# Patient Record
Sex: Male | Born: 1935 | Race: White | Hispanic: No | Marital: Married | Smoking: Former smoker
Health system: Southern US, Community
[De-identification: ages and names within clinical notes are randomized; demographics above are authoritative.]

## PROBLEM LIST (undated history)

## (undated) DIAGNOSIS — F329 Major depressive disorder, single episode, unspecified: Secondary | ICD-10-CM

## (undated) DIAGNOSIS — J449 Chronic obstructive pulmonary disease, unspecified: Secondary | ICD-10-CM

## (undated) DIAGNOSIS — M1990 Unspecified osteoarthritis, unspecified site: Secondary | ICD-10-CM

## (undated) DIAGNOSIS — N200 Calculus of kidney: Secondary | ICD-10-CM

## (undated) DIAGNOSIS — G4730 Sleep apnea, unspecified: Secondary | ICD-10-CM

## (undated) DIAGNOSIS — C801 Malignant (primary) neoplasm, unspecified: Secondary | ICD-10-CM

## (undated) DIAGNOSIS — F32A Depression, unspecified: Secondary | ICD-10-CM

## (undated) DIAGNOSIS — I639 Cerebral infarction, unspecified: Secondary | ICD-10-CM

## (undated) DIAGNOSIS — J15212 Pneumonia due to Methicillin resistant Staphylococcus aureus: Secondary | ICD-10-CM

## (undated) DIAGNOSIS — N400 Enlarged prostate without lower urinary tract symptoms: Secondary | ICD-10-CM

## (undated) DIAGNOSIS — K2270 Barrett's esophagus without dysplasia: Secondary | ICD-10-CM

## (undated) DIAGNOSIS — D649 Anemia, unspecified: Secondary | ICD-10-CM

## (undated) DIAGNOSIS — I2510 Atherosclerotic heart disease of native coronary artery without angina pectoris: Secondary | ICD-10-CM

## (undated) HISTORY — PX: LUNG REMOVAL, PARTIAL: SHX233

## (undated) HISTORY — PX: LAMINECTOMY: SHX219

## (undated) HISTORY — PX: GASTROSTOMY W/ FEEDING TUBE: SUR642

## (undated) HISTORY — PX: PEG TUBE PLACEMENT: SUR1034

## (undated) HISTORY — PX: OTHER SURGICAL HISTORY: SHX169

## (undated) HISTORY — PX: TRACHEOSTOMY: SUR1362

## (undated) HISTORY — PX: ROTATOR CUFF REPAIR: SHX139

## (undated) HISTORY — PX: URETERAL STENT PLACEMENT: SHX822

---

## 2004-05-10 ENCOUNTER — Emergency Department: Payer: Self-pay | Admitting: Emergency Medicine

## 2004-08-25 ENCOUNTER — Emergency Department: Payer: Self-pay | Admitting: Emergency Medicine

## 2004-11-15 ENCOUNTER — Ambulatory Visit: Payer: Self-pay | Admitting: Internal Medicine

## 2005-03-08 ENCOUNTER — Ambulatory Visit: Payer: Self-pay | Admitting: Unknown Physician Specialty

## 2005-08-11 ENCOUNTER — Ambulatory Visit: Payer: Self-pay | Admitting: Internal Medicine

## 2007-09-22 ENCOUNTER — Emergency Department: Payer: Self-pay | Admitting: Emergency Medicine

## 2007-10-03 ENCOUNTER — Ambulatory Visit: Payer: Self-pay | Admitting: Specialist

## 2008-06-02 ENCOUNTER — Ambulatory Visit: Payer: Self-pay | Admitting: Internal Medicine

## 2008-06-05 ENCOUNTER — Ambulatory Visit: Payer: Self-pay | Admitting: Internal Medicine

## 2008-09-16 ENCOUNTER — Emergency Department: Payer: Self-pay | Admitting: Surgery

## 2008-12-14 ENCOUNTER — Inpatient Hospital Stay: Payer: Self-pay | Admitting: Internal Medicine

## 2008-12-21 ENCOUNTER — Inpatient Hospital Stay: Payer: Self-pay | Admitting: Specialist

## 2008-12-24 ENCOUNTER — Ambulatory Visit: Payer: Self-pay | Admitting: Urology

## 2009-01-02 ENCOUNTER — Ambulatory Visit: Payer: Self-pay | Admitting: Urology

## 2009-01-08 ENCOUNTER — Ambulatory Visit: Payer: Self-pay | Admitting: Urology

## 2009-01-19 ENCOUNTER — Ambulatory Visit: Payer: Self-pay | Admitting: Urology

## 2009-02-13 ENCOUNTER — Ambulatory Visit: Payer: Self-pay | Admitting: Internal Medicine

## 2009-02-17 ENCOUNTER — Ambulatory Visit: Payer: Self-pay | Admitting: Urology

## 2009-03-05 ENCOUNTER — Ambulatory Visit: Payer: Self-pay | Admitting: Urology

## 2009-03-20 ENCOUNTER — Ambulatory Visit: Payer: Self-pay | Admitting: Urology

## 2009-04-27 ENCOUNTER — Ambulatory Visit: Payer: Self-pay | Admitting: Urology

## 2009-11-18 IMAGING — CR DG ABDOMEN 1V
1 series · 1 of 1 positions shown · non-contrast
Comparison: none

REASON FOR EXAM: renal calculi-lithotripsy
COMMENTS:

[view not recorded]
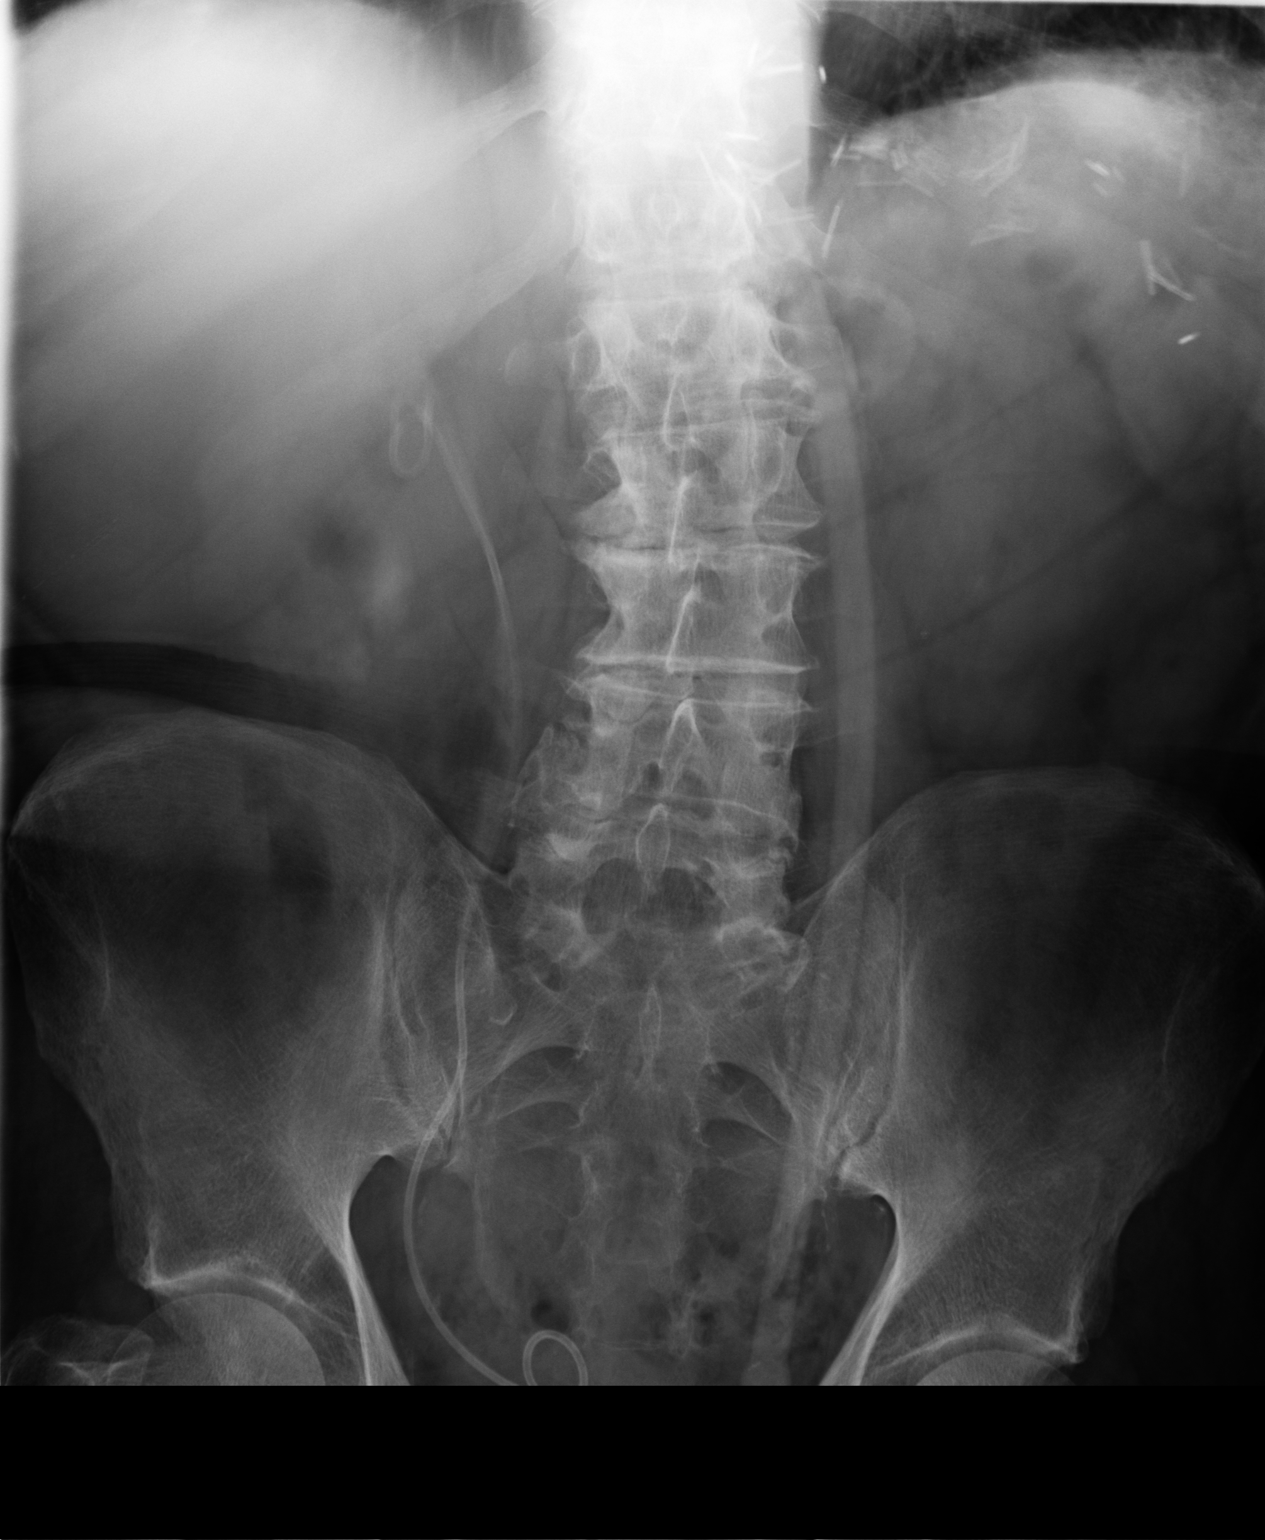

[1 of 1 positions shown; findings below may reference images not displayed]

PROCEDURE:     DXR - DXR KIDNEY URETER BLADDER  - January 08, 2009  [DATE]

RESULT:     A double-J, right ureteral stent is present. There is
significant motion artifact. A percutaneous gastrostomy tube is present.
Poorly demonstrated density over the lower pole of the right kidney region
is likely a large renal calculus but again there is significant motion
artifact. There is no previous exam for comparison.
IMPRESSION: Double-J, right ureteral stent with probable right renal
calculus.

## 2009-11-22 ENCOUNTER — Emergency Department: Payer: Self-pay | Admitting: Emergency Medicine

## 2010-05-10 ENCOUNTER — Emergency Department: Payer: Self-pay | Admitting: Emergency Medicine

## 2010-07-05 ENCOUNTER — Ambulatory Visit: Payer: Self-pay | Admitting: Internal Medicine

## 2011-01-11 ENCOUNTER — Emergency Department: Payer: Self-pay | Admitting: Emergency Medicine

## 2011-04-05 ENCOUNTER — Ambulatory Visit: Payer: Self-pay | Admitting: Otolaryngology

## 2011-06-06 ENCOUNTER — Ambulatory Visit: Payer: Self-pay | Admitting: Urology

## 2011-09-05 ENCOUNTER — Emergency Department: Payer: Self-pay | Admitting: Emergency Medicine

## 2011-10-31 ENCOUNTER — Ambulatory Visit: Payer: Self-pay

## 2012-01-23 ENCOUNTER — Emergency Department: Payer: Self-pay | Admitting: Emergency Medicine

## 2012-05-28 ENCOUNTER — Ambulatory Visit: Payer: Self-pay | Admitting: Urology

## 2012-07-05 ENCOUNTER — Ambulatory Visit: Payer: Self-pay

## 2012-11-26 ENCOUNTER — Ambulatory Visit: Payer: Self-pay | Admitting: Physician Assistant

## 2012-12-04 ENCOUNTER — Ambulatory Visit: Payer: Self-pay | Admitting: Urology

## 2012-12-10 ENCOUNTER — Ambulatory Visit: Payer: Self-pay | Admitting: Urology

## 2012-12-25 ENCOUNTER — Emergency Department: Payer: Self-pay | Admitting: Emergency Medicine

## 2013-01-07 ENCOUNTER — Ambulatory Visit: Payer: Self-pay | Admitting: Internal Medicine

## 2013-01-11 ENCOUNTER — Ambulatory Visit: Payer: Self-pay | Admitting: Internal Medicine

## 2013-01-21 ENCOUNTER — Ambulatory Visit: Payer: Self-pay | Admitting: Internal Medicine

## 2013-02-21 ENCOUNTER — Emergency Department: Payer: Self-pay | Admitting: Emergency Medicine

## 2013-02-22 ENCOUNTER — Ambulatory Visit: Payer: Self-pay | Admitting: Internal Medicine

## 2013-06-06 ENCOUNTER — Ambulatory Visit: Payer: Self-pay | Admitting: Urology

## 2013-11-17 IMAGING — CR DG CHEST 2V
1 series · 3 of 3 positions shown · non-contrast
Comparison: none

REASON FOR EXAM: wheezing
COMMENTS:

PROCEDURE:     KDR - KDXR CHEST PA (OR AP) AND LAT  - January 07, 2013 [DATE]
RESULT:     Comparison: 11/26/2012, 07/05/2010

[Series 1: pa · 0.17mm/px · 3 of 3 slices shown]
[im 1/3]
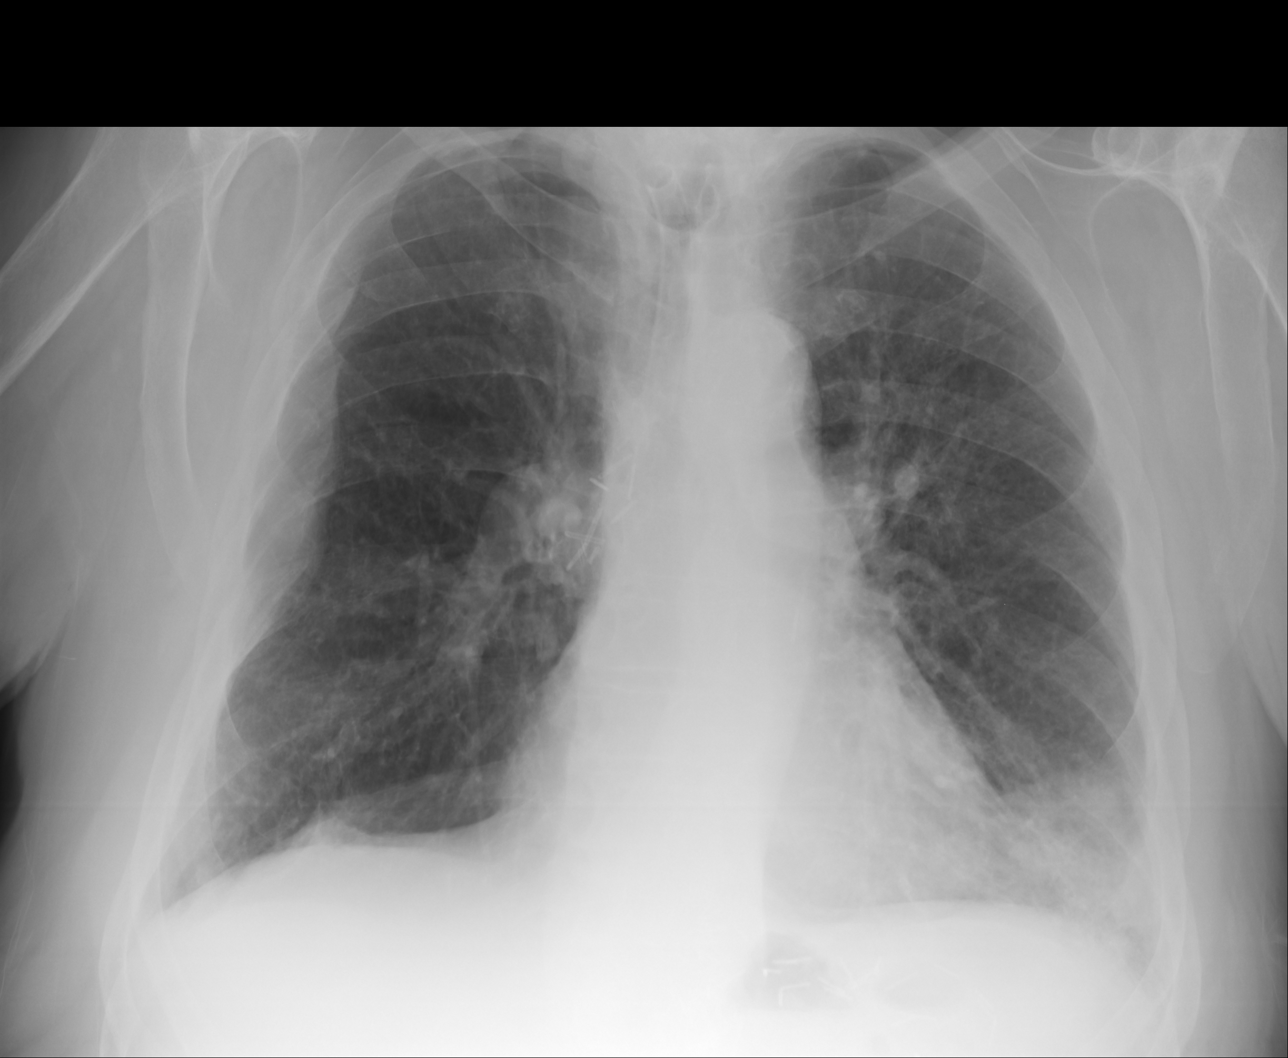
[im 2/3]
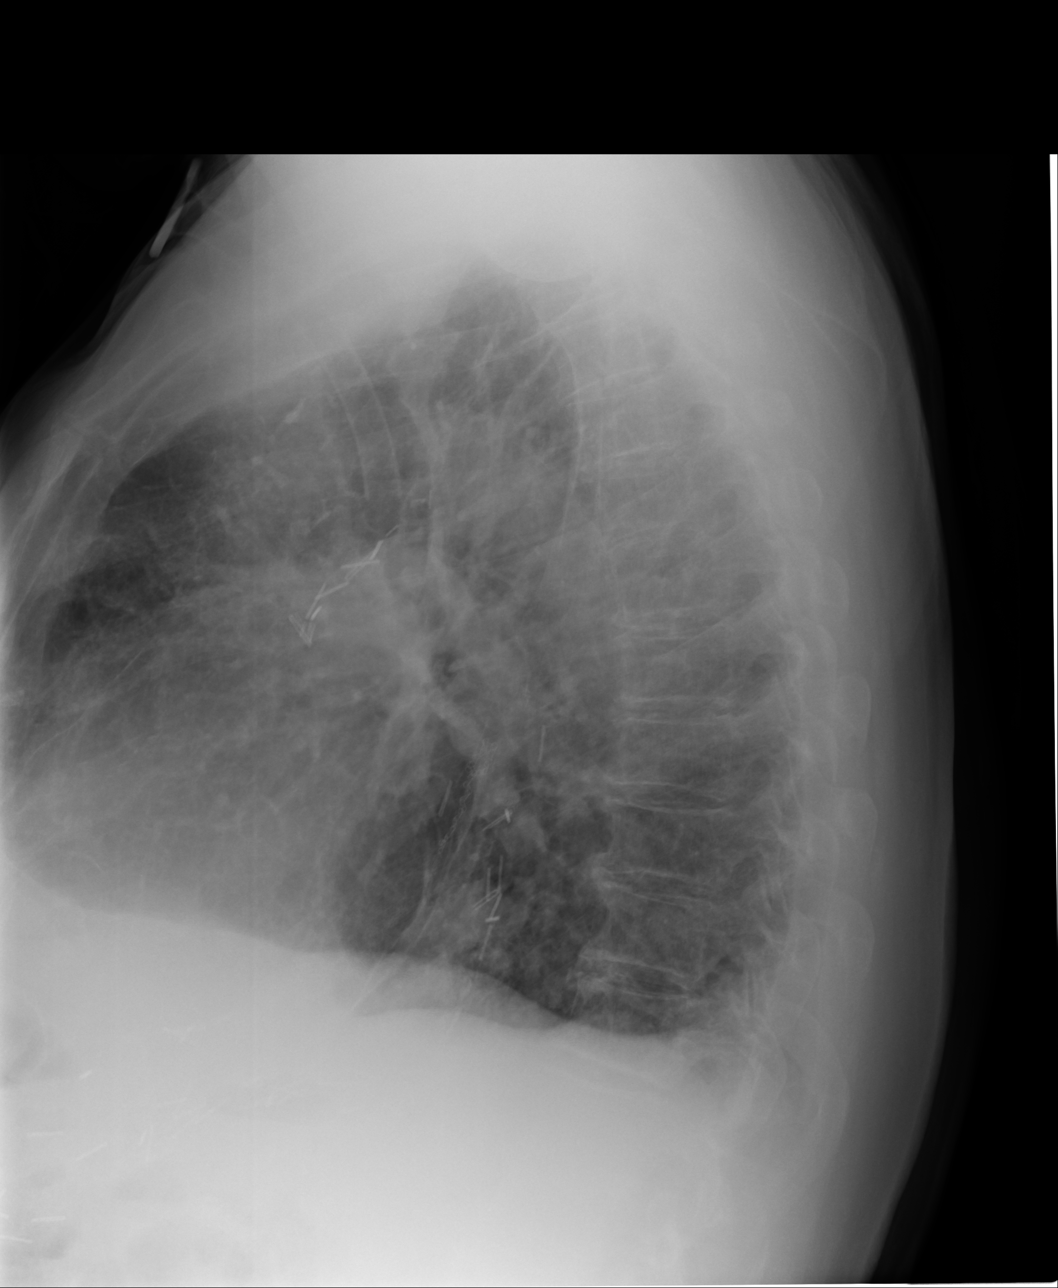
[im 3/3]
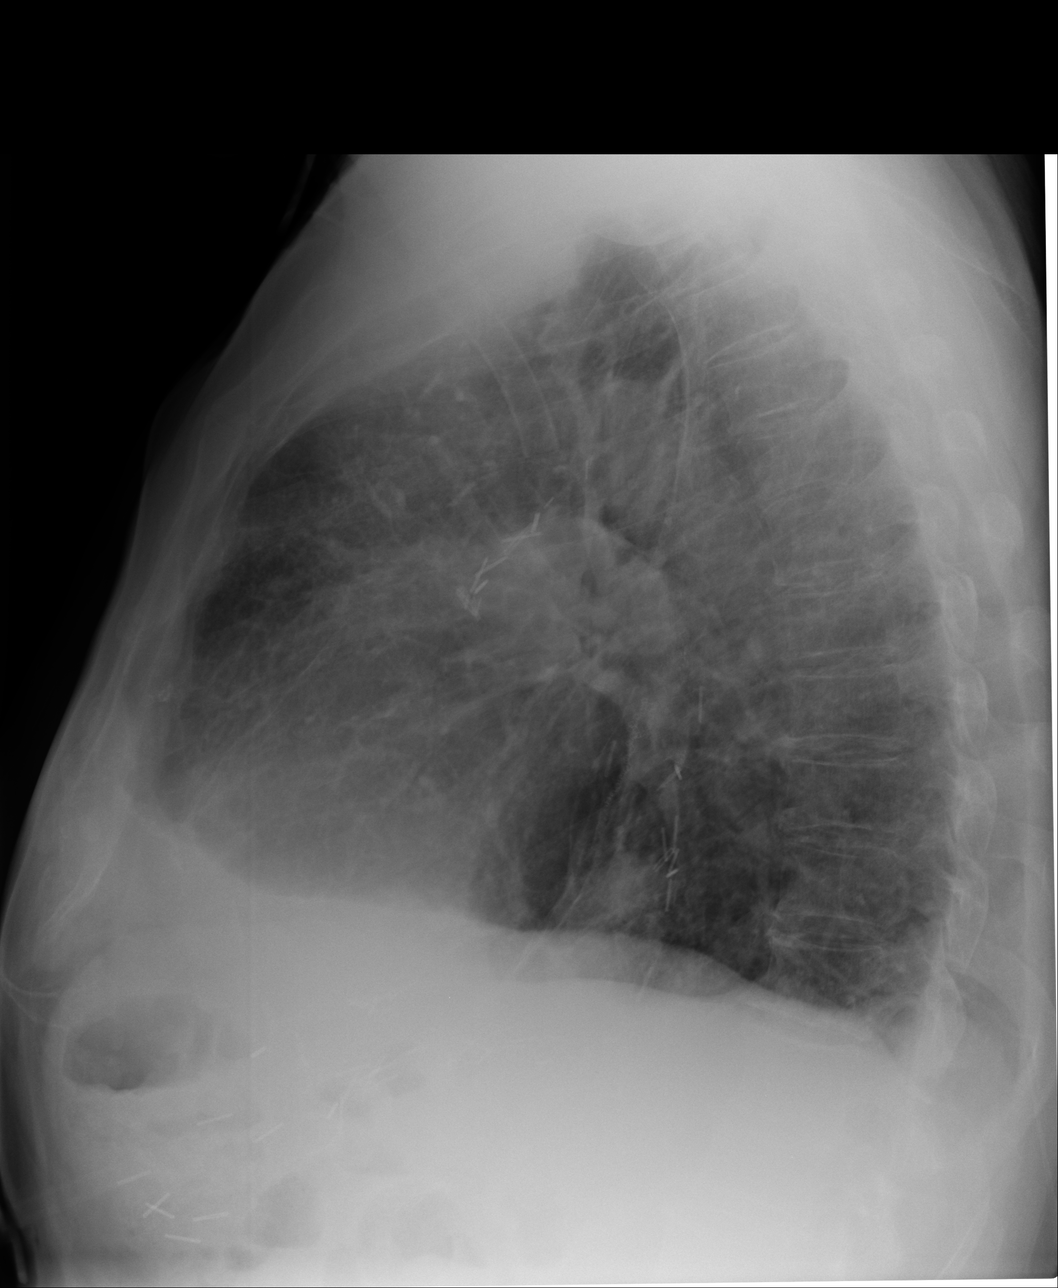

[3 of 3 positions shown; findings below may reference images not displayed]

FINDINGS: The heart and mediastinum are stable. Perihilar reticular opacities on the
left are similar to slightly decreased from prior. Focal heterogeneous
opacity at the left costophrenic angle is similar to slightly increased from
prior. Multiple surgical clips overlie the right hilum. Thickening along the
right lateral thorax is similar to prior studies.
IMPRESSION: 1. Left perihilar reticular opacities are similar to slightly decreased from
prior. These are nonspecific.
2. Focal heterogeneous opacity at the left costophrenic angle similar to
slightly increased in conspicuity from prior. Further evaluation with CT of
the chest is recommended to evaluate for an underlying mass.

[REDACTED]

## 2013-11-21 IMAGING — CT CT CHEST W/O CM
1 of 2 series · 14 of 32 positions shown, 18 images · non-contrast
Comparison: None

REASON FOR EXAM: abn chest xray
COMMENTS:

PROCEDURE:     KCT - KCT CHEST WITHOUT CONTRAST  - January 11, 2013  [DATE]
RESULT:     Indication: Perihilar lung disease
TECHNIQUE: Multiple axial images of the chest are obtained without
intravenous contrast.

[Series 2: chest w/o 3.0 i31f 2 · axial · non-contrast · 0.83mm/px · z∈[-596,-350]mm · 14 of 98 slices shown, 18 images]
[im 8/98  mediastinal]
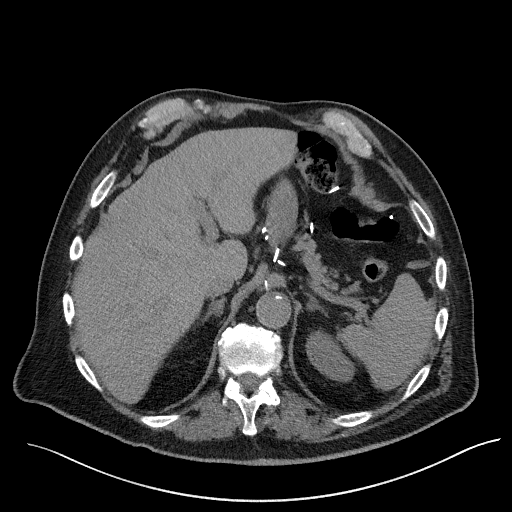
[im 8/98  lung]
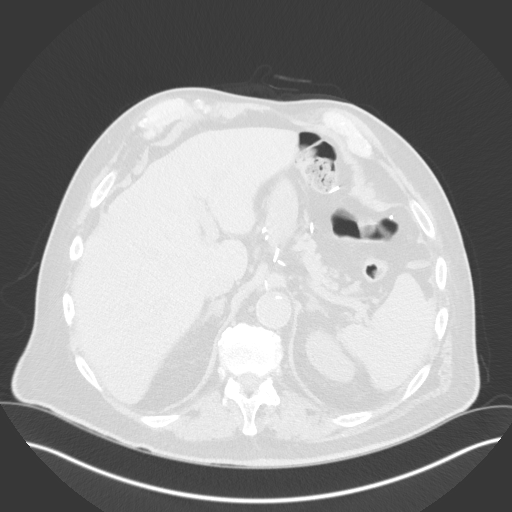
[im 15/98  lung]
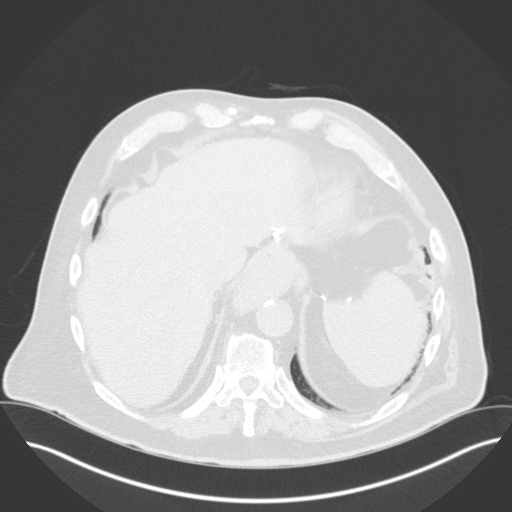
[im 23/98  lung]
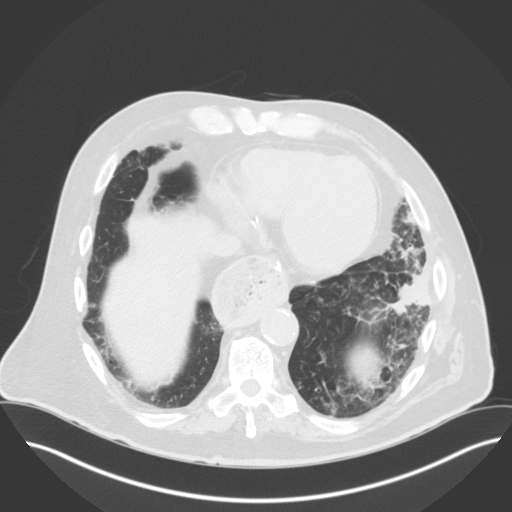
[im 30/98  lung]
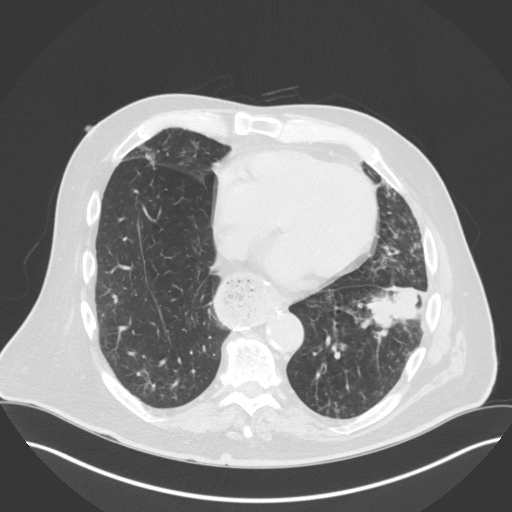
[im 38/98  mediastinal]
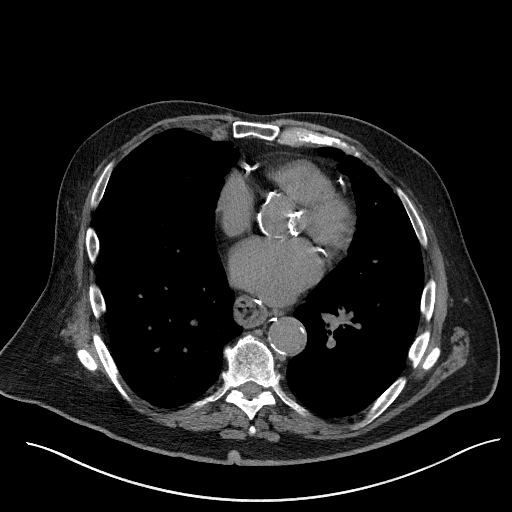
[im 38/98  lung]
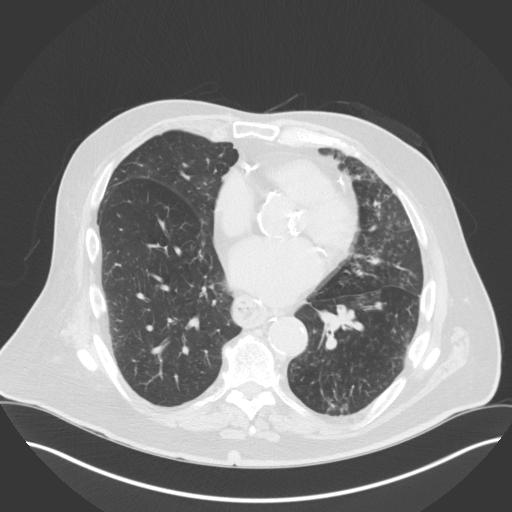
[im 45/98  lung]
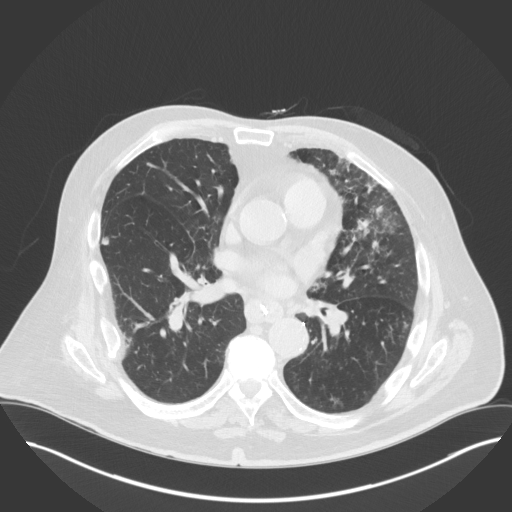
[im 47/98  lung]
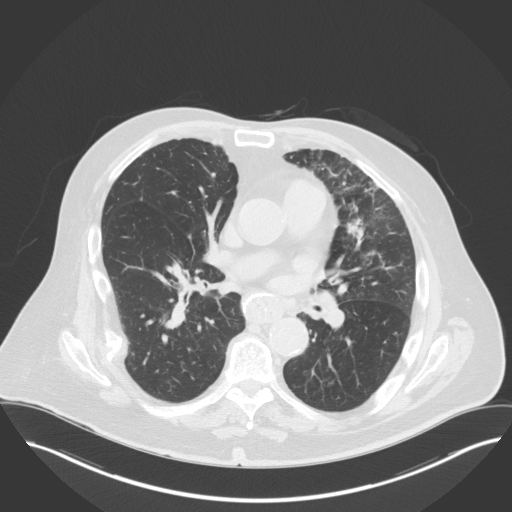
[im 49/98  lung]
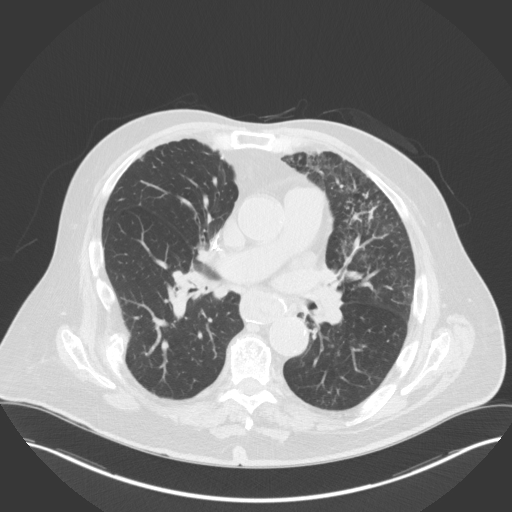
[im 53/98  mediastinal]
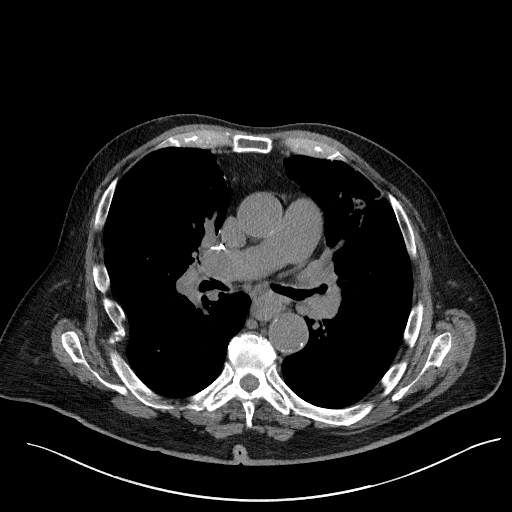
[im 53/98  lung]
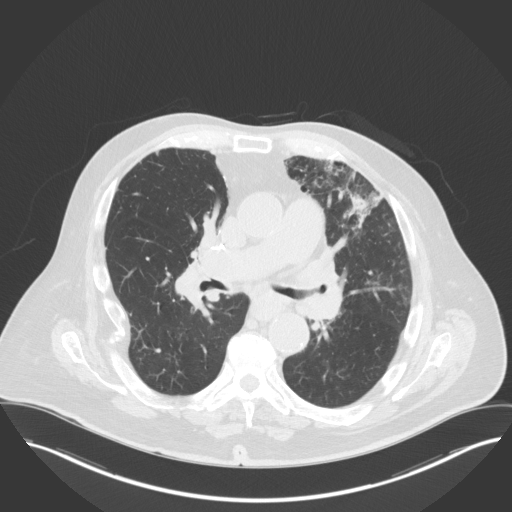
[im 60/98  lung]
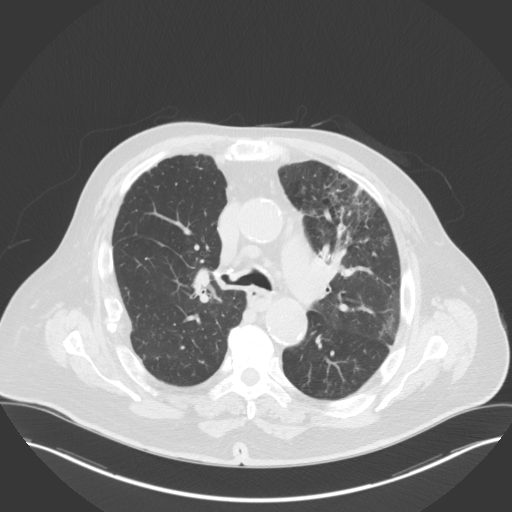
[im 68/98  lung]
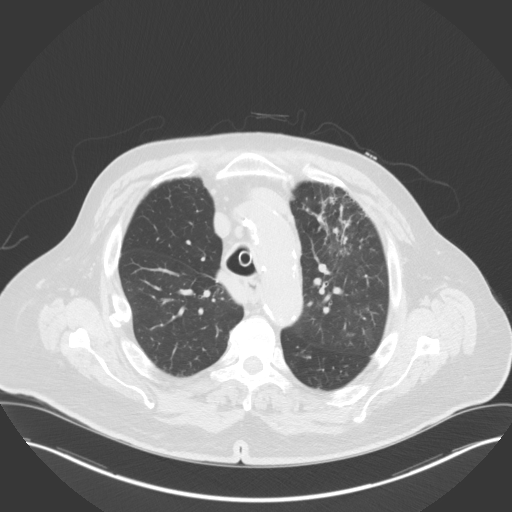
[im 75/98  lung]
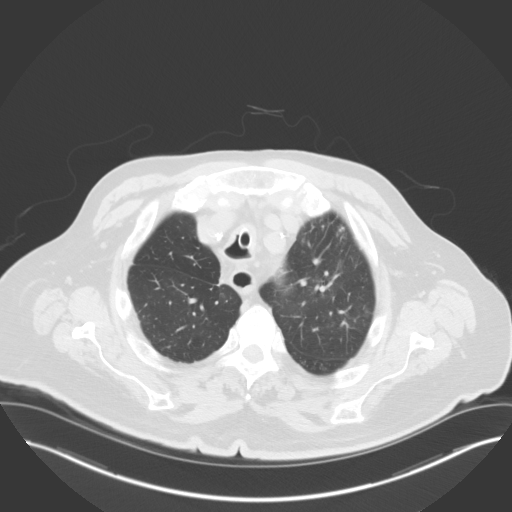
[im 83/98  mediastinal]
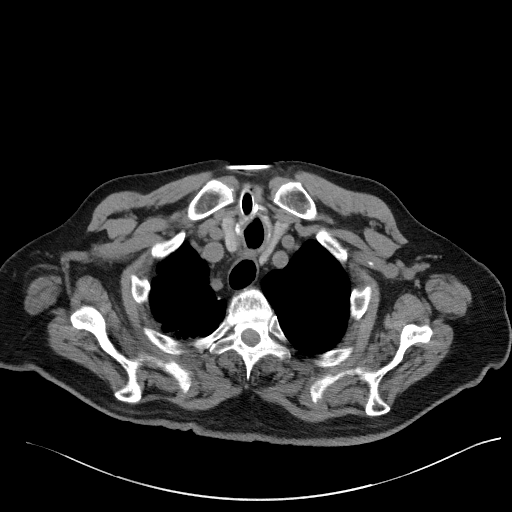
[im 83/98  lung]
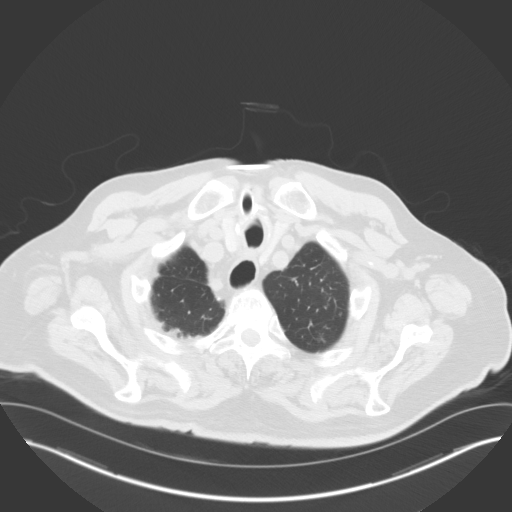
[im 90/98  lung]
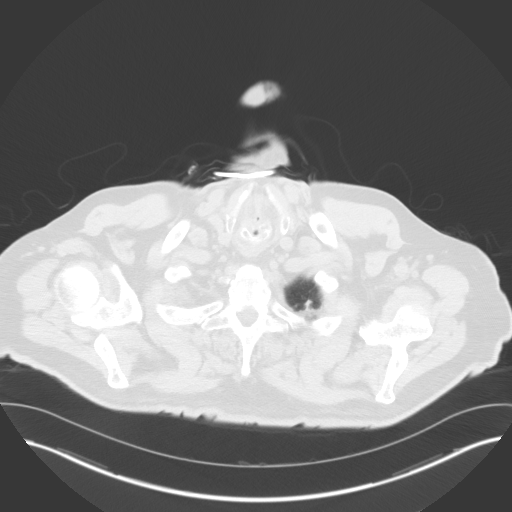

[14 of 32 positions shown; findings below may reference images not displayed]

FINDINGS: The central airways are patent. There is left upper lobe and left lower lobe
interstitial and alveolar airspace opacities. There is a focal masslike area
in the anterior left lower lobe measuring 2.8 x 4 cm extending to the
pleural surface. There is a 6 mm right lower lobe pulmonary nodule. There is
no pleural effusion or pneumothorax. There is a tracheostomy tube noted.

There are no pathologically enlarged axillary, hilar, or mediastinal lymph
nodes.

The heart size is normal. There is no pericardial effusion. The thoracic
aorta is normal in caliber.  There is coronary artery atherosclerosis
involving the LAD, circumflex and right coronary artery.

Review of bone windows demonstrates no focal lytic or sclerotic lesions.

Limited noncontrast images of the upper abdomen were obtained. The adrenal
glands appear normal. There are changes from prior gastric pull-through
procedure with esophagogastric anastomosis.
IMPRESSION: 1. There is left upper lobe and left lower lobe reticular-nodular
interstitial disease concerning for an infectious or inflammatory etiology
including atypical infection.

2. There is a masslike airspace opacity measuring 2.8 x 4 cm extending to
the pleural surface. The appearance is concerning for a soft tissue mass.
Oncology consultation recommended.

[REDACTED]

## 2014-03-14 ENCOUNTER — Ambulatory Visit: Payer: Self-pay | Admitting: Orthopaedic Surgery

## 2014-06-13 LAB — LIPASE, BLOOD: Lipase: 148 U/L (ref 73–393)

## 2014-06-13 LAB — COMPREHENSIVE METABOLIC PANEL
ALBUMIN: 2.9 g/dL — AB (ref 3.4–5.0)
ANION GAP: 7 (ref 7–16)
Alkaline Phosphatase: 105 U/L
BILIRUBIN TOTAL: 0.2 mg/dL (ref 0.2–1.0)
BUN: 22 mg/dL — ABNORMAL HIGH (ref 7–18)
CO2: 29 mmol/L (ref 21–32)
Calcium, Total: 8.1 mg/dL — ABNORMAL LOW (ref 8.5–10.1)
Chloride: 102 mmol/L (ref 98–107)
Creatinine: 0.84 mg/dL (ref 0.60–1.30)
EGFR (African American): >60
EGFR (Non-African Amer.): >60
GLUCOSE: 107 mg/dL — AB (ref 65–99)
OSMOLALITY: 279 (ref 275–301)
Potassium: 4.1 mmol/L (ref 3.5–5.1)
SGOT(AST): 21 U/L (ref 15–37)
SGPT (ALT): 16 U/L
Sodium: 138 mmol/L (ref 136–145)
Total Protein: 7.1 g/dL (ref 6.4–8.2)

## 2014-06-13 LAB — CBC
HCT: 38.8 % — AB (ref 40.0–52.0)
HGB: 12.4 g/dL — ABNORMAL LOW (ref 13.0–18.0)
MCH: 28.2 pg (ref 26.0–34.0)
MCHC: 32 g/dL (ref 32.0–36.0)
MCV: 88 fL (ref 80–100)
Platelet: 222 10*3/uL (ref 150–440)
RBC: 4.4 10*6/uL (ref 4.40–5.90)
RDW: 14 % (ref 11.5–14.5)
WBC: 16.1 10*3/uL — ABNORMAL HIGH (ref 3.8–10.6)

## 2014-06-13 LAB — TROPONIN I

## 2014-06-14 ENCOUNTER — Inpatient Hospital Stay: Payer: Self-pay | Admitting: Internal Medicine

## 2014-06-14 LAB — URINALYSIS, COMPLETE
Bilirubin,UR: NEGATIVE
Glucose,UR: NEGATIVE mg/dL (ref 0–75)
NITRITE: POSITIVE
Ph: 5 (ref 4.5–8.0)
Protein: 30
RBC,UR: 12 /HPF (ref 0–5)
Specific Gravity: 1.017 (ref 1.003–1.030)
WBC UR: 74 /HPF (ref 0–5)

## 2014-06-15 LAB — BASIC METABOLIC PANEL
Anion Gap: 6 — ABNORMAL LOW (ref 7–16)
BUN: 16 mg/dL (ref 7–18)
CALCIUM: 8.1 mg/dL — AB (ref 8.5–10.1)
Chloride: 108 mmol/L — ABNORMAL HIGH (ref 98–107)
Co2: 29 mmol/L (ref 21–32)
Creatinine: 0.8 mg/dL (ref 0.60–1.30)
EGFR (African American): >60
GLUCOSE: 96 mg/dL (ref 65–99)
Osmolality: 286 (ref 275–301)
Potassium: 4.6 mmol/L (ref 3.5–5.1)
Sodium: 143 mmol/L (ref 136–145)

## 2014-06-15 LAB — CBC WITH DIFFERENTIAL/PLATELET
BASOS ABS: 0 10*3/uL (ref 0.0–0.1)
Basophil %: 0.3 %
Eosinophil #: 0 10*3/uL (ref 0.0–0.7)
Eosinophil %: 0.4 %
HCT: 37.1 % — ABNORMAL LOW (ref 40.0–52.0)
HGB: 11.8 g/dL — ABNORMAL LOW (ref 13.0–18.0)
LYMPHS ABS: 0.5 10*3/uL — AB (ref 1.0–3.6)
Lymphocyte %: 3.8 %
MCH: 28.2 pg (ref 26.0–34.0)
MCHC: 31.8 g/dL — AB (ref 32.0–36.0)
MCV: 89 fL (ref 80–100)
MONO ABS: 1.7 x10 3/mm — AB (ref 0.2–1.0)
Monocyte %: 13.6 %
NEUTROS PCT: 81.9 %
Neutrophil #: 10.5 10*3/uL — ABNORMAL HIGH (ref 1.4–6.5)
Platelet: 194 10*3/uL (ref 150–440)
RBC: 4.17 10*6/uL — ABNORMAL LOW (ref 4.40–5.90)
RDW: 14.2 % (ref 11.5–14.5)
WBC: 12.8 10*3/uL — AB (ref 3.8–10.6)

## 2014-06-16 LAB — CBC WITH DIFFERENTIAL/PLATELET
Basophil #: 0 10*3/uL (ref 0.0–0.1)
Basophil %: 0.4 %
Eosinophil #: 0 10*3/uL (ref 0.0–0.7)
Eosinophil %: 0.6 %
HCT: 34.9 % — ABNORMAL LOW (ref 40.0–52.0)
HGB: 11.2 g/dL — ABNORMAL LOW (ref 13.0–18.0)
LYMPHS PCT: 7.5 %
Lymphocyte #: 0.6 10*3/uL — ABNORMAL LOW (ref 1.0–3.6)
MCH: 28.4 pg (ref 26.0–34.0)
MCHC: 32.1 g/dL (ref 32.0–36.0)
MCV: 89 fL (ref 80–100)
Monocyte #: 1.5 x10 3/mm — ABNORMAL HIGH (ref 0.2–1.0)
Monocyte %: 20.7 %
NEUTROS ABS: 5.3 10*3/uL (ref 1.4–6.5)
Neutrophil %: 70.8 %
Platelet: 209 10*3/uL (ref 150–440)
RBC: 3.94 10*6/uL — ABNORMAL LOW (ref 4.40–5.90)
RDW: 13.8 % (ref 11.5–14.5)
WBC: 7.5 10*3/uL (ref 3.8–10.6)

## 2014-06-16 LAB — URINALYSIS, COMPLETE
Bacteria: NONE SEEN
Bilirubin,UR: NEGATIVE
Glucose,UR: NEGATIVE mg/dL (ref 0–75)
Ketone: NEGATIVE
Nitrite: NEGATIVE
PH: 6 (ref 4.5–8.0)
Specific Gravity: 1.016 (ref 1.003–1.030)
Squamous Epithelial: <1
WBC UR: 98 /HPF (ref 0–5)

## 2014-06-17 LAB — URINE CULTURE

## 2014-06-18 LAB — URINE CULTURE

## 2014-10-18 NOTE — Op Note (Signed)
Patient: This 81 year old Male had a surgical procedure performed on 14-Jun-2014.  Post Operative Report:  Pre-Op Diagnosis Obstructing right ureter stone   Post-Op Diagnosis Same   Operation Cystoscopy, right retrograde pyelogram, right ureteral stent placement   Anesthesia General   Specimen Type Describe  Right kidney urine culture   Findings Right hydronephrosis, drainage of purulent urine from right kidney   Surgeon Chales Salmon, MD   EBL: Minimal   Complications None   Description of Procedure: INDICATIONS FOR PROCEDURE:  Mr. Tomeo is an 81 year-old man with a 62mm right proximal ureteral stone, right hydronephrosis with perinephric stranding, leukocytosis, positive urinalysis for infection and unrelenting right flank pain for 11 hours. I discussed the risks, benefits and alternatives of the procedure with him including risk of damage to the ureter, kidney or bladder and urethra, worsening infection, bleeding, ureteral stricture or loss of kidney.  He elected to proceed with right ureteral stent placement to drain his infection and hopefully relieve his symptoms.  DESCRIPTION OF PROCEDURE IN DETAIL:  After informed consent, Mr. Hissong was brought to the operating suite and placed in a supine position for administration of LMA anesthesia.  He was then repositioned in a low dorsal lithotomy and his lower abdomen, perineum and genitalia were then prepped and draped in the usual sterile fashion.  Perioperative IV ceftriaxone was given.    A 21 French rigid cystoscope was inserted per urethra. Cystoscopy showed some prostatic enlargement, mild bladder trabeculation and normal orthotopic ureteral orifices.    The right ureteral orifice was identified and intubated with a 6 [place] open-ended catheter.  An angled sensor wire was passed through the 6 French catheter into the distal ureter and the 6 French catheter was advanced.  Gentle retrograde pyelogram was performed showing a normal  caliber ureter up to the proximal ureter where there was a 16mm stone. Hydronephrosis was seen proximal to the stone.  The sensor wire was then advanced into the right renal pelvis.  The open-ended catheter was advanced into the right renal pelvis. Debris was then seen eminating from the ureteral orifice. Urine was collected from the catheter and sent as a culture specimen. The catheter was exchanged for the sensor wire and removed. A 26 cm x 6 [employer] Percuflex ureteral stent was passed under direct cystoscopic and fluoroscopic guidance over the sensor wire.  It was deployed with a 360 degree coil seen in both the renal pelvis and the bladder.  The stent continued to drain blood tinged, slightly cloudy urine. The cystoscope was removed and a new 21 French Foley catheter was inserted per urethra and the balloon was inflated with 10cc and the catheter was allowed to gravity drainage.  This concluded the case.  The patient was awoken from anesthesia, extubated and brought to the PACU in stable condition.   Other Pertinent Information He will be admitted to the medical service for observation. He will need definitive stone management in 1-2 weeks after acute infection has resolved.   Electronic Signatures: Prentiss Bells (MD)  (Signed 19-Dec-15 04:22)  Authored: Patient and Date/Time, Operative Note   Last Updated: 19-Dec-15 04:22 by Prentiss Bells (MD)

## 2014-10-18 NOTE — H&P (Signed)
PATIENT NAME:  Stephen Taylor, Stephen Taylor MR#:  [id number] DATE OF BIRTH:  02-09-1932  DATE OF ADMISSION:  06/14/2014  REFERRING PHYSICIAN:  Urological service.  CONSULTING PHYSICIAN:  Norva Riffle. Marcille Blanco, MD  PRIMARY CARE DOCTOR: Dr. Humphrey Rolls.  CONSULTATION QUESTION: For surgical clearance.   HISTORY OF PRESENT ILLNESS: This is an 81 year old Caucasian male who presents to the Emergency Department complaining of progressive difficulty urinating as well as right flank pain. The patient has a history of multiple kidney stones and immediately recognized these symptoms as being associated with renal calculi. He denies any hematuria, but admits to some subjective fevers. The patient does not have a history of coronary artery disease and has not complained of any chest pain with this admission. The urological service has determined that he would benefit from ureteral stent placement, but wanted surgical clearance for safe measure.   REVIEW OF SYSTEMS:  CONSTITUTIONAL: The patient admits to subjective fever, but denies weakness or weight loss.  EYES: Denies blurred vision or inflammation.  EARS, NOSE AND THROAT: Denies tinnitus or difficulty swallowing.  RESPIRATORY: The patient admits to some shortness of breath at baseline.  CARDIOVASCULAR: Denies chest pain, orthopnea, or paroxysmal nocturnal dyspnea.  GASTROINTESTINAL: Denies nausea, vomiting, or diarrhea.  GENITOURINARY: Denies dysuria, but admits to hesitancy of urination.  ENDOCRINE: Denies polyuria or polydipsia.  HEMATOLOGIC AND LYMPHATIC: Denies easy bruising or bleeding.  INTEGUMENT: Denies rashes or lesions.  MUSCULOSKELETAL: Denies myalgias or arthralgias.  NEUROLOGIC: Denies numbness in his extremities or dysarthria.  PSYCHIATRIC: Denies depression or suicidal ideation.   PAST MEDICAL HISTORY: History of pharyngeal and esophageal cancer, history of lung cancer, and history of gastric cancer. The patient also has an extensive history of kidney  stone formation.   PAST SURGICAL HISTORY: Tracheostomy placement, right lower lobectomy of his lung, a partial gastrectomy as well as a partial esophageal resection.   SOCIAL HISTORY: The patient is married. He does not smoke, drink, or do any drugs.   FAMILY HISTORY: He has a brother with diabetes.   MEDICATIONS:  1.  Ambien 5 mg 1 tablet p.o. at bedtime.  2.  Hemocyte 324 mg 1 tablet p.o. daily for 30 days.  3.  Pantoprazole 20 mg delayed-release tablet 2 tablets p.o. daily.  4.  Folic acid 1 mg 1 tablet p.o. daily.  5.  DuoNeb inhalations 1 nebulizer 4 times a day as needed for coughing, wheezing, or shortness of breath.  6.  Fluoxetine 20 mg 1 capsule p.o. daily.  7.  Lorazepam 0.5 mg 2 tablets p.o. every 12 hours as needed for anxiety.   ALLERGIES: MORPHINE AND POLLEN.   PERTINENT LABORATORY RESULTS AND RADIOGRAPHIC FINDINGS: Serum glucose is 107, BUN 22, creatinine 0.84, sodium is 138, potassium 4.1, chloride 102, bicarbonate is 29, calcium is 8.1, lipase is 148, serum albumin is 2.9, alkaline phosphatase is 105, AST is 21, ALT is 16. Troponin is negative. White blood cell count is 16.1, hemoglobin 12.4, hematocrit 38.8, platelet count 222,000. Urinalysis shows 1+ blood with 2+ leukocyte esterase. Also, the patient is patient's urine is nitrite positive. He has 74 white blood cells per high-power field and 12 red blood cells per high-power field, that are consistent with urinary tract infection CT scan of the abdomen and pelvis shows obstructive 6 mm calculus within the right proximal ureter with secondary moderate to severe right hydronephrosis. There is also some additional nonobstructive hydronephrosis as well as some irregular masses at the bases of both lungs. The patient gallstones as well  as colonic diverticula without diverticulitis. There is enlarged prostate visualized as well.   PHYSICAL EXAMINATION:  VITAL SIGNS: Temperature is 98.3, pulse 85, respirations 22, blood pressure  167/70, pulse oximetry is 93% on room air.  GENERAL: The patient is alert and oriented x 3 in no apparent distress.  HEENT: Normocephalic, atraumatic. Pupils equal, round, and reactive to light and accommodation. Extraocular movements are intact. Mucous membranes are moist.  NECK: Trachea is midline. No adenopathy. The tracheostomy and tracheostomy collar are in place.  CHEST: Symmetric, atraumatic.  CARDIOVASCULAR: Regular rate and rhythm. Normal S1, S2. No rubs or clicks. There is a 3/6 systolic murmur heard best over the mitral valve area.  LUNGS: There are wheezes in the right upper and middle lobe, but the patient has normal effort and excursion. As noted before, the tracheostomy is in place.  ABDOMEN: Positive bowel sounds. Soft, nontender, nondistended. No hepatosplenomegaly. The gastrostomy tube site clean and firmly in place.  GENITOURINARY: Normal external male genitalia.  MUSCULOSKELETAL: The patient moves all 4 extremities equally. There is 5/5 strength in upper and lower extremities bilaterally.  SKIN: No rashes or lesions.  EXTREMITIES: No clubbing, cyanosis, or edema.  NEUROLOGIC: Cranial nerves II-XII are grossly intact.  PSYCHIATRIC: Mood is normal. Affect is congruent.   ASSESSMENT AND PLAN: This is an 81 year old male admitted for renal calculus with associated hydronephrosis.  1.  Renal calculus with hydronephrosis. The patient is afebrile but does have leukocytosis and tachypnea, which meets criteria for sepsis. We will give him a dose of antibiotics prior to going to surgery. He will also need antibiotics following surgery for a complicated urinary tract infection.  2.  Right lower lobectomy. This places the patient at high risk for surgery. However, he is not having thoracic surgery and should thus be able to undergo general anesthesia while placed on the ventilator via his tracheostomy. Again, this makes him high risk, but he is in overall good pulmonary condition.  3.  Deep  vein thrombosis prophylaxis. At the discretion of the primary physician following surgery. The patient is ambulatory at baseline and should not be sedentary for long following his ureteral stent placement.  4.  Gastrointestinal prophylaxis. Unnecessary, as the patient is not critically ill.   CODE STATUS: The patient is a full code.   TIME SPENT ON PATIENT CARE AND DOCUMENTATION: Approximately 35 minutes.    ____________________________ Norva Riffle. Marcille Blanco, MD msd:bm D: 06/14/2014 02:54:19 ET T: 06/14/2014 03:42:43 ET JOB#: [id number]  cc: Norva Riffle. Marcille Blanco, MD, <Dictator> Norva Riffle Arsenio Schnorr MD ELECTRONICALLY SIGNED 06/15/2014 0:42

## 2014-10-18 NOTE — Consult Note (Signed)
PATIENT NAME:  Stephen Taylor, HUEGEL MR#:  [id number] DATE OF BIRTH:  Jul 27, 1931  DATE OF CONSULTATION:  06/14/2014  REFERRING PHYSICIAN:  Urological service.  CONSULTING PHYSICIAN:  Norva Riffle. Marcille Blanco, MD  PRIMARY CARE DOCTOR: Dr. Humphrey Rolls.  CONSULTATION QUESTION: For surgical clearance.   HISTORY OF PRESENT ILLNESS: This is an 81 year old Caucasian male who presents to the Emergency Department complaining of progressive difficulty urinating as well as right flank pain. The patient has a history of multiple kidney stones and immediately recognized these symptoms as being associated with renal calculi. He denies any hematuria, but admits to some subjective fevers. The patient does not have a history of coronary artery disease and has not complained of any chest pain with this admission. The urological service has determined that he would benefit from ureteral stent placement, but wanted surgical clearance for safe measure.   REVIEW OF SYSTEMS:  CONSTITUTIONAL: The patient admits to subjective fever, but denies weakness or weight loss.  EYES: Denies blurred vision or inflammation.  EARS, NOSE AND THROAT: Denies tinnitus or difficulty swallowing.  RESPIRATORY: The patient admits to some shortness of breath at baseline.  CARDIOVASCULAR: Denies chest pain, orthopnea, or paroxysmal nocturnal dyspnea.  GASTROINTESTINAL: Denies nausea, vomiting, or diarrhea.  GENITOURINARY: Denies dysuria, but admits to hesitancy of urination.  ENDOCRINE: Denies polyuria or polydipsia.  HEMATOLOGIC AND LYMPHATIC: Denies easy bruising or bleeding.  INTEGUMENT: Denies rashes or lesions.  MUSCULOSKELETAL: Denies myalgias or arthralgias.  NEUROLOGIC: Denies numbness in his extremities or dysarthria.  PSYCHIATRIC: Denies depression or suicidal ideation.   PAST MEDICAL HISTORY: History of pharyngeal and esophageal cancer, history of lung cancer, and history of gastric cancer. The patient also has an extensive history of kidney  stone formation.   PAST SURGICAL HISTORY: Tracheostomy placement, right lower lobectomy of his lung, a partial gastrectomy as well as a partial esophageal resection.   SOCIAL HISTORY: The patient is married. He does not smoke, drink, or do any drugs.   FAMILY HISTORY: He has a brother with diabetes.   MEDICATIONS:  1.  Ambien 5 mg 1 tablet p.o. at bedtime.  2.  Hemocyte 324 mg 1 tablet p.o. daily for 30 days.  3.  Pantoprazole 20 mg delayed-release tablet 2 tablets p.o. daily.  4.  Folic acid 1 mg 1 tablet p.o. daily.  5.  DuoNeb inhalations 1 nebulizer 4 times a day as needed for coughing, wheezing, or shortness of breath.  6.  Fluoxetine 20 mg 1 capsule p.o. daily.  7.  Lorazepam 0.5 mg 2 tablets p.o. every 12 hours as needed for anxiety.   ALLERGIES: MORPHINE AND POLLEN.   PERTINENT LABORATORY RESULTS AND RADIOGRAPHIC FINDINGS: Serum glucose is 107, BUN 22, creatinine 0.84, sodium is 138, potassium 4.1, chloride 102, bicarbonate is 29, calcium is 8.1, lipase is 148, serum albumin is 2.9, alkaline phosphatase is 105, AST is 21, ALT is 16. Troponin is negative. White blood cell count is 16.1, hemoglobin 12.4, hematocrit 38.8, platelet count 222,000. Urinalysis shows 1+ blood with 2+ leukocyte esterase. Also, the patient is patient's urine is nitrite positive. He has 74 white blood cells per high-power field and 12 red blood cells per high-power field, that are consistent with urinary tract infection CT scan of the abdomen and pelvis shows obstructive 6 mm calculus within the right proximal ureter with secondary moderate to severe right hydronephrosis. There is also some additional nonobstructive hydronephrosis as well as some irregular masses at the bases of both lungs. The patient gallstones as well  as colonic diverticula without diverticulitis. There is enlarged prostate visualized as well.   PHYSICAL EXAMINATION:  VITAL SIGNS: Temperature is 98.3, pulse 85, respirations 22, blood pressure  167/70, pulse oximetry is 93% on room air.  GENERAL: The patient is alert and oriented x 3 in no apparent distress.  HEENT: Normocephalic, atraumatic. Pupils equal, round, and reactive to light and accommodation. Extraocular movements are intact. Mucous membranes are moist.  NECK: Trachea is midline. No adenopathy. The tracheostomy and tracheostomy collar are in place.  CHEST: Symmetric, atraumatic.  CARDIOVASCULAR: Regular rate and rhythm. Normal S1, S2. No rubs or clicks. There is a 3/6 systolic murmur heard best over the mitral valve area.  LUNGS: There are wheezes in the right upper and middle lobe, but the patient has normal effort and excursion. As noted before, the tracheostomy is in place.  ABDOMEN: Positive bowel sounds. Soft, nontender, nondistended. No hepatosplenomegaly. The gastrostomy tube site clean and firmly in place.  GENITOURINARY: Normal external male genitalia.  MUSCULOSKELETAL: The patient moves all 4 extremities equally. There is 5/5 strength in upper and lower extremities bilaterally.  SKIN: No rashes or lesions.  EXTREMITIES: No clubbing, cyanosis, or edema.  NEUROLOGIC: Cranial nerves II-XII are grossly intact.  PSYCHIATRIC: Mood is normal. Affect is congruent.   ASSESSMENT AND PLAN: This is an 81 year old male admitted for renal calculus with associated hydronephrosis.  1.  Renal calculus with hydronephrosis. The patient is afebrile but does have leukocytosis and tachypnea, which meets criteria for sepsis. We will give him a dose of antibiotics prior to going to surgery. He will also need antibiotics following surgery for a complicated urinary tract infection.  2.  Right lower lobectomy. This places the patient at high risk for surgery. However, he is not having thoracic surgery and should thus be able to undergo general anesthesia while placed on the ventilator via his tracheostomy. Again, this makes him high risk, but he is in overall good pulmonary condition.  3.  Deep  vein thrombosis prophylaxis. At the discretion of the primary physician following surgery. The patient is ambulatory at baseline and should not be sedentary for long following his ureteral stent placement.  4.  Gastrointestinal prophylaxis. Unnecessary, as the patient is not critically ill.   CODE STATUS: The patient is a full code.   TIME SPENT ON PATIENT CARE AND DOCUMENTATION: Approximately 35 minutes.    ____________________________ Norva Riffle. Marcille Blanco, MD msd:bm D: 06/14/2014 02:54:19 ET T: 06/14/2014 03:42:43 ET JOB#: [id number]  cc: Norva Riffle. Marcille Blanco, MD, <Dictator> Norva Riffle Angela Vazguez MD ELECTRONICALLY SIGNED 06/15/2014 0:42

## 2014-10-18 NOTE — Consult Note (Signed)
Brief Consult Note: Diagnosis: Obstructing renal stone and ESBL E coli UTI.   Patient was seen by consultant.   Consult note dictated.   Recommend further assessment or treatment.   Orders entered.   Discussed with Attending MD.   Comments: He has clinically responded quite nicely to placement of the stent and to ceftriaxone so doubt infection major player I have asked micro to add on fosfomycin to sensitivities and would dc him on that 3 gm q 3 days for 12 more days (4 doses) If he worsens on that would need to have ertapenem.  Electronic Signatures: Angelena Form (MD)  (Signed 21-Dec-15 15:02)  Authored: Brief Consult Note   Last Updated: 21-Dec-15 15:02 by Angelena Form (MD)

## 2014-10-18 NOTE — Consult Note (Signed)
Brief Consult Note: Diagnosis: h/o respiratory failure.   Patient was seen by consultant.   Consult note dictated.   Recommend to proceed with surgery or procedure.   Comments: Pt high risk for surgery.  Overall surprisingly good health despite extensive h/o cancer, but may proceed if benefit outweighs risk..  Electronic Signatures: Harrie Foreman (MD)  (Signed 19-Dec-15 02:40)  Authored: Brief Consult Note   Last Updated: 19-Dec-15 02:40 by Harrie Foreman (MD)

## 2014-10-18 NOTE — Consult Note (Signed)
Chief Complaint:  Subjective/Chief Complaint POD 1 s/p right ureteral stent placement Pain controlled. Foley d/c'd this AM. Tol POs, ambulating.   VITAL SIGNS/ANCILLARY NOTES: **Vital Signs.:   20-Dec-15 08:15  Vital Signs Type Q 8hr  Temperature Temperature (F) 98.7  Celsius 37  Temperature Source oral  Pulse Pulse 82  Respirations Respirations 18  Systolic BP Systolic BP 962  Diastolic BP (mmHg) Diastolic BP (mmHg) 62  Mean BP 89  Pulse Ox % Pulse Ox % 95  Pulse Ox Activity Level  At rest  Oxygen Delivery Room Air/ 21 %  *Intake and Output.:   Shift 20-Dec-15 15:00  Grand Totals Intake:  357 Output:  510    Net:  -153 24 Hr.:  -153  Oral Intake      In:  0  IV (Primary)      In:  247  IV (Secondary)      In:  50  Other Intake cc     In:  60  Urine ml     Out:  510  Length of Stay Totals Intake:  2173 Output:  2760    Net:  -587   Brief Assessment:  GEN well developed, well nourished   Cardiac Regular  no murmur  -- LE edema   Respiratory normal resp effort  clear BS   Gastrointestinal Normal   Gastrointestinal details normal Soft  Nontender  Nondistended   EXTR negative cyanosis/clubbing   Additional Physical Exam no CVAT   Lab Results:  Routine Chem:  20-Dec-15 04:59   Glucose, Serum 96  BUN 16  Creatinine (comp) 0.80  Sodium, Serum 143  Potassium, Serum 4.6  Chloride, Serum  108  CO2, Serum 29  Calcium (Total), Serum  8.1  Anion Gap  6  Osmolality (calc) 286  eGFR (African American) >60  eGFR (Non-African American) >60 (eGFR values <58m/min/1.73 m2 may be an indication of chronic kidney disease (CKD). Calculated eGFR, using the MRDR Study equation, is useful in  patients with stable renal function. The eGFR calculation will not be reliable in acutely ill patients when serum creatinine is changing rapidly. It is not useful in patients on dialysis. The eGFR calculation may not be applicable to patients at the low and high extremes of body  sizes, pregnant women, and vegetarians.)  Routine Hem:  20-Dec-15 04:59   WBC (CBC)  12.8  RBC (CBC)  4.17  Hemoglobin (CBC)  11.8  Hematocrit (CBC)  37.1  Platelet Count (CBC) 194  MCV 89  MCH 28.2  MCHC  31.8  RDW 14.2  Neutrophil % 81.9  Lymphocyte % 3.8  Monocyte % 13.6  Eosinophil % 0.4  Basophil % 0.3  Neutrophil #  10.5  Lymphocyte #  0.5  Monocyte #  1.7  Eosinophil # 0.0  Basophil # 0.0 (Result(s) reported on 15 Jun 2014 at 05:45AM.)   Assessment/Plan:  Assessment/Plan:  Assessment 81year old man with a right ureteral stone, pod 1 s/p right ureteral stent for infection   Plan 1) Await final cultures and d/c with PO antibiotics, flomax and PO pain meds 2) Will need Urology follow-up in 1-2 weeks for definitive stone management  Urology will sign off. Thank you for involving me in his care.   Electronic Signatures: KPrentiss Bells(MD)  (Signed 20-Dec-15 15:35)  Authored: Chief Complaint, VITAL SIGNS/ANCILLARY NOTES, Brief Assessment, Lab Results, Assessment/Plan   Last Updated: 20-Dec-15 15:35 by KPrentiss Bells(MD)

## 2014-10-18 NOTE — Consult Note (Signed)
Chief Complaint:  Subjective/Chief Complaint pod 0 s/p right ureteral stent placement for obstructing ureteral stone Doing well. Pain improved. No f/c/n/v. Tol POs.   VITAL SIGNS/ANCILLARY NOTES: **Vital Signs.:   19-Dec-15 16:47  Vital Signs Type Q 8hr  Temperature Temperature (F) 98.4  Celsius 36.8  Temperature Source oral  Pulse Pulse 77  Respirations Respirations 20  Systolic BP Systolic BP 116  Diastolic BP (mmHg) Diastolic BP (mmHg) 64  Mean BP 79  Pulse Ox % Pulse Ox % 91  Pulse Ox Activity Level  At rest  Oxygen Delivery 2L  *Intake and Output.:   Shift 19-Dec-15 23:00  Grand Totals Intake:  0 Output:  250    Net:  -250 24 Hr.:  -960  Oral Intake      In:  0  Urine ml     Out:  250  Length of Stay Totals Intake:  240 Output:  1200    Net:  -960   Brief Assessment:  GEN well developed, well nourished, no acute distress   Cardiac Regular   Respiratory normal resp effort  Tracheostomy   Gastrointestinal Normal   Gastrointestinal details normal Soft  Nontender  Nondistended  no CVAT   EXTR negative cyanosis/clubbing, negative edema   Additional Physical Exam Foley draining clear   Assessment/Plan:  Assessment/Plan:  Assessment 81 year old man with a 60mm obstructing right ureter stone now s/p right ureteral stent placement   Plan 1) Continue IV antibiotics 2) CBC and BMP in AM 3) D/C foley in AM 4) If WBC normal, OK for discharge tomorrow.  5) Will need Urology follow-up in 1-2 weeks for definitive management of his right ureteral stone.  Recommend Flomax, PO Abx and pain medication for discharge.  Thank you for involving me in his care. Please don't hesitate to contact me with any questions.   Electronic Signatures: Prentiss Bells (MD)  (Signed 19-Dec-15 17:35)  Authored: Chief Complaint, VITAL SIGNS/ANCILLARY NOTES, Brief Assessment, Assessment/Plan   Last Updated: 19-Dec-15 17:35 by Prentiss Bells (MD)

## 2014-10-18 NOTE — Consult Note (Signed)
Admit Diagnosis:   KIDNEY STONE PAIN R SIDE: Onset Date: 14-Jun-2014, Status: Active, Description: KIDNEY STONE PAIN R SIDE    g tube:    Cancer: esophogus   Cancer, Stomach:    Cancer, Throat:    Cancer: lung   back surgery:    Rotator Cuff Surgery: LEFT   Cataract Extraction:    Tracheostomy:    Lobectomy:    Other, see comments: punctured right lung with ribs   Back Surgery:   Home Medications: Medication Instructions Status  Hemocyte tablet 324 mg 1 tab(s) orally once a day x 30 days  Active  DuoNeb inhalation solution 1 dose  4 times a day as needed   Active  fluoxetine 20 mg oral capsule 1 tab(s)  once a day  Active  lorazepam 0.5 mg oral tablet 2 tab(s)  every 12 hours  Active  folic acid 1 mg oral tablet 1  orally once a day  Active  Ambien 5 mg oral tablet 1 tab(s) orally once a day (at bedtime) Active  pantoprazole 20 mg oral delayed release tablet 2 tab(s) orally once a day Active   Lab Results: Hepatic:  18-Dec-15 19:56   Bilirubin, Total 0.2  Alkaline Phosphatase 105 (46-116 NOTE: New Reference Range 01/14/14)  SGPT (ALT) 16 (14-63 NOTE: New Reference Range 01/14/14)  SGOT (AST) 21  Total Protein, Serum 7.1  Albumin, Serum  2.9  Routine Chem:  18-Dec-15 19:56   Glucose, Serum  107  BUN  22  Creatinine (comp) 0.84  Sodium, Serum 138  Potassium, Serum 4.1  Chloride, Serum 102  CO2, Serum 29  Calcium (Total), Serum  8.1  Osmolality (calc) 279  eGFR (African American) >60  eGFR (Non-African American) >60 (eGFR values <109m/min/1.73 m2 may be an indication of chronic kidney disease (CKD). Calculated eGFR, using the MRDR Study equation, is useful in  patients with stable renal function. The eGFR calculation will not be reliable in acutely ill patients when serum creatinine is changing rapidly. It is not useful in patients on dialysis. The eGFR calculation may not be applicable to patients at the low and high extremes of body sizes,  pregnant women, and vegetarians.)  Result Comment POTASSIUM/BUN/AST - Slight hemolysis, interpret results with  - caution.  Result(s) reported on 13 Jun 2014 at 08:44PM.  Anion Gap 7  Lipase 148 (Result(s) reported on 13 Jun 2014 at 08:37PM.)  Cardiac:  18-Dec-15 19:56   Troponin I < 0.02 (0.00-0.05 0.05 ng/mL or less: NEGATIVE  Repeat testing in 3-6 hrs  if clinically indicated. >0.05 ng/mL: POTENTIAL  MYOCARDIAL INJURY. Repeat  testing in 3-6 hrs if  clinically indicated. NOTE: An increase or decrease  of 30% or more on serial  testing suggests a  clinically important change)  Routine UA:  19-Dec-15 00:10   Color (UA) Yellow  Clarity (UA) Cloudy  Glucose (UA) Negative  Bilirubin (UA) Negative  Ketones (UA) Trace  Specific Gravity (UA) 1.017  Blood (UA) 1+  pH (UA) 5.0  Protein (UA) 30 mg/dL  Nitrite (UA) Positive  Leukocyte Esterase (UA) 2+ (Result(s) reported on 14 Jun 2014 at 01:25AM.)  RBC (UA) 12 /HPF  WBC (UA) 74 /HPF  Bacteria (UA) 1+  Epithelial Cells (UA) <1 /HPF  Mucous (UA) PRESENT (Result(s) reported on 14 Jun 2014 at 01:25AM.)  Routine Hem:  18-Dec-15 19:56   WBC (CBC)  16.1  RBC (CBC) 4.40  Hemoglobin (CBC)  12.4  Hematocrit (CBC)  38.8  Platelet Count (CBC) 222 (Result(s)  reported on 13 Jun 2014 at 08:30PM.)  MCV 88  MCH 28.2  MCHC 32.0  RDW 14.0   Radiology Results:  Radiology Results: CT:    19-Dec-15 00:16, CT Abdomen Pelvis WO for Stone  CT Abdomen Pelvis WO for Stone  REASON FOR EXAM:    right flank pain  COMMENTS:       PROCEDURE: CT  - CT ABDOMEN /PELVIS WO (STONE)  - Jun 14 2014 12:16AM     EXAM:  CT ABDOMEN AND PELVIS WITHOUT CONTRAST    TECHNIQUE:  Multidetector CT imaging of the abdomen and pelvis was performed  following the standard protocol without IV contrast.    COMPARISON:  None.    FINDINGS:  There is an irregular spiculated mass measuring 4.4 x 4.0 cm within  the right infrahilar region, incompletely visualized  (series 4,  image 1). Additional irregular mass measuring 5.9 x 4.5 cm seen  within the peripheral left lower lobe (series 4, image 3). These  lesions are incompletely evaluated on this exam. Possible  postobstructive atelectasis/consolidation within the inferior  lingula. No pleural or pericardial effusion. Coronary artery  calcifications noted.    Limited noncontrast evaluation of the liver is unremarkable. Stones  and/or sludge present within the gallbladder lumen. No CT evidence  for acute cholecystitis. No biliary dilatation. Spleen, adrenal  glands, and pancreas demonstrate a normal unenhanced appearance.    Nonobstructive 4 mm calculus present within the lower pole of the  left kidney. No CT evidence for left-sided obstructive uropathy.    On the right, there is an obstructive 6 mm stone within the proximal  right ureter with secondary moderate to severe right  hydroureteronephrosis. Additional nonobstructive calculi measuring  up to 9 mm present within the lower pole of the right kidney. Ill  defined hypodense lesion within the lower pole the right kidney may  reflect a small cyst. There is right perinephric and periureteral  fat stranding. No other stones seen distally along the course of the  right renal collecting system.    Extensive postoperative changes present about the GE junction.  Percutaneous G-tube in place. No evidence for obstruction. Appendix  is normal. Colonic diverticulosis present without acute  diverticulitis.  Bladder within normal limits. Prostate is enlarged measuring 5.5 cm  in transverse diameter.    No free air or fluid.    No pathologically enlarged intra-abdominal or pelvic lymph nodes.    No acute osseous abnormality. No worrisome lytic or blastic osseous  lesion. Levoscoliosis with apex at L2-3 present with multilevel  degenerative changes throughout the visualized spine.     IMPRESSION:  1. Obstructive 6 mm calculus within the proximal  right ureter with  secondary moderate to severe right hydroureteronephrosis.  2. Additional bilateral nonobstructive nephrolithiasis as above.  3. Irregular masses within the partially visualized lung bases,  incompletely evaluated on this exam. Further evaluation with  cross-sectional imaging of the chest could be performed for further  evaluation as clinically desired.  4. Cholelithiasis.  5. Colonic diverticulosis without acute diverticulitis.  6. Enlarged prostate.      Electronically Signed    By: Jeannine Boga M.D.    On: 06/14/2014 00:44         Verified By: Neomia Glass, M.D.,    Morphine: Other  Pollen: Other   General Aspect 81 year old man with multiple medical issues, presents with severe right flank pain since 5:30pm, up to 10/10, difficult to control in the ED. CT shows  33m right ureter stone, proximal hydronephrosis and perinephric stranding, WBC 16, u/a positive for infection.  This is his 6th or 7th stone episode. No trauma. No gross hematuria, no UTI symptoms.   Case History and Physical Exam:  Chief Complaint Abdominal Pain  R flank   Past Medical Health Cancer, Lung cancer (right), laryngeal cancer s/p radiation, stomach cancer   Past Surgical History PEG tube, tracheostomy, back surgery, left shoulder surgery, right lower lobectomy of lung   HEENT PERLA   Neck/Nodes Other  tracheostomy   Chest/Lungs Clear   Breasts Not examined   Cardiovascular No Murmurs or Gallops  Normal Sinus Rhythm   Abdomen Benign   Genitalia WNL   Musculoskeletal Full range of motion   Neurological Grossly WNL   Skin Warm  Dry    Impression 81year old man with sepsis and obstructing right 632mureteral stone   Plan 1) OR tonight for emergent right ureteral stent placement 2) Admit to medical service 3) IV Antibiotics 4) IVF hydration 5) NPO for OR  Thank you for involving me in his care. Urology will continue to follow post-operatively.    Electronic Signatures: KaPrentiss BellsMD)  (Signed 19-Dec-15 03:13)  Authored: Health Issues, Significant Events - History, Home Medications, Labs, Radiology Results, Allergies, General Aspect/Present Illness, History and Physical Exam, Impression/Plan   Last Updated: 19-Dec-15 03:13 by KaPrentiss BellsMD)

## 2014-10-22 NOTE — Consult Note (Signed)
PATIENT NAME:  Stephen Taylor, Stephen Taylor MR#:  [id number] DATE OF BIRTH:  15-Apr-1932  DATE OF CONSULTATION:  06/16/2014  REQUESTING PHYSICIAN:  Myrtis Ser, MD   CONSULTING PHYSICIAN:  Cheral Marker. Ola Spurr, MD   REASON FOR CONSULTATION: ESBL Escherichia coli urinary tract infection.   HISTORY OF PRESENT ILLNESS: An 81 year old gentleman who was admitted 06/14/2014 with difficulty urinating and right flank pain.  He has a history of multiple kidney stones. He was found to have an obstructing right-sided nephrolithiasis.  He was seen by urology and had placement of a stent on 06/14/2014 by Dr. Deatra Ina.  Symptoms have improved and his labs have improved; however, his urine culture is now growing ESBL Escherichia coli.  Since admission, he has been treated with ceftriaxone. He has been afebrile. His white count has improved.   PAST MEDICAL HISTORY:  1.  Pharyngeal and esophageal cancer.  2.  Lung cancer.  3.  Gastric cancer.  4.  Prior kidney stone.   PAST SURGICAL HISTORY: Tracheostomy placement, right thyroid lobectomy, partial gastrectomy and partial esophageal resection.   SOCIAL HISTORY: He is married. He lives with his wife and 5 dogs. He does not smoke or drink.   FAMILY HISTORY: Noncontributory.   ANTIBIOTICS SINCE ADMISSION: Include ceftriaxone.   ALLERGIES: MORPHINE AND POLLEN.   REVIEW OF SYSTEMS:  Eleven systems reviewed and negative except as per history of present illness.   PHYSICAL EXAMINATION:  VITAL SIGNS: Temperature 98.3. He has been afebrile since admission, pulse 76, blood pressure 133/69, respirations 19, sat 93% on room air.  GENERAL: He is pleasant. He is thin. He has a tracheostomy, and chronically ill-appearing.  HEENT: Pupils equal, round, reactive to light and accommodation. Extraocular movements are intact. Sclerae are anicteric.  OROPHARYNX: Clear.  NECK: A tracheostomy site which is within normal limits.  HEART: Regular.  LUNGS: Clear.  ABDOMEN: Soft,  nontender, nondistended. No hepatosplenomegaly. No CVA tenderness.  EXTREMITIES: No clubbing, clubbing, cyanosis or edema.  NEUROLOGIC: He is alert and oriented x 3. Grossly nonfocal neuro exam.   DATA: White blood count on 06/13/2014 on admission was 16.1, currently 7.5; hemoglobin 11.2, platelets 209,000.  Renal function shows a creatinine of 0.8.  LFTs are normal except albumin low at 2.9.  Urinalysis on 06/14/2014 had 74 white cells and 12 red cells. Urine culture grew greater than 100,000 Escherichia coli which is an ESBL producer sensitive to nitrofurantoin, gentamicin and imipenem.   IMAGING: CT abdomen without first stones showed obstructive 6 mm calculus within the proximal right urethra with secondary moderate to severe right hydronephrosis. There was additional bilateral nonobstructive nephrolithiasis. There was cholelithiasis there was chronic diverticulosis. There was an enlarged prostate.   IMPRESSION: An 81 year old gentleman with history of multiple medical problems including recurrent kidney stones admitted 12/19 with flank pain and decreased urine output.  He was found to have an obstructing right ureteral stone and moderate to severe hydronephrosis.  He had successful placement on 12/19 of the urethral stent.  White blood count on admission was 16,000 and has come down to 7 with ceftriaxone.  However, cultures are growing Escherichia coli which is an extended spectrum beta-lactamase producer and is not sensitive to ceftriaxone.   I had a long discussion with the patient about the possible need to place a PICC line and give IV carbapenem; however, he is very hesitant to do this. Another option we discussed would be to use Fosfomycin although we do not have evidence that he is sensitive to this.  It is not frequently used and there is good sensitivities usually to the Escherichia coli.  If he have were to worsen, he would return for more urgent follow-up.   RECOMMENDATIONS:  1.  I have  asked the micro lab to add Fosfomycin to the sensitivities. This will go to [facility] and it will take a few days.  2.  I will give him a dose of Fosfomycin today and then discharge him on Fosfomycin 3 grams every 3 days for 4 more doses, a total of 12 days.  3.  If he worsens, he will need to seek medical care over holiday weekend and will likely need a PICC line with IV ertapenem.  4.  I have discussed this with the patient extensively as well as with Dr. Volanda Napoleon.   Thank you for the consult. I will be glad to follow with you.     ____________________________ Cheral Marker. Ola Spurr, MD dpf:DT D: 06/16/2014 15:08:22 ET T: 06/16/2014 16:02:04 ET JOB#: [id number]  cc: Cheral Marker. Ola Spurr, MD, <Dictator> Rayen Palen Ola Spurr MD ELECTRONICALLY SIGNED 06/29/2014 21:25

## 2014-10-22 NOTE — Discharge Summary (Signed)
PATIENT NAME:  Stephen Taylor, Stephen Taylor MR#:  [id number] DATE OF BIRTH:  1932/01/10  DATE OF ADMISSION:  06/14/2014 DATE OF DISCHARGE:  06/16/2014  ADMITTING DIAGNOSIS: Renal calculus with hydronephrosis.   DISCHARGE DIAGNOSES:  1. Right ureteral obstruction, status post stenting, now resolved.  2. Pyelonephritis with extended-spectrum beta-lactamase Escherichia coli.  3. Coronary artery disease.  4. Transient hypoxia after surgical procedure.  5. Fever and leukocytosis.  6. History of pharyngeal and esophageal cancer.  7. History of lung cancer.  8. History of gastric cancer.  9. History of recurrent nephrolithiasis.   CONSULTATIONS:  1.  Dr. Deatra Ina, urology.  2.  Dr. Ola Spurr, infectious disease.   PROCEDURES: 1.  Cystoscopy and right retrograde pyelogram right ureteral stent placement by Dr. Deatra Ina 06/14/2014.  2.  CT scan abdomen and pelvis without contrast for stone December 19th, prior to procedure, shows obstructive 6 mm calculus within the proximal right ureter with secondary moderate to severe right hydroureteronephrosis. Additional bilateral nonobstructive nephrolithiasis as above. Irregular masses within the partially visualized lung bases, incompletely evaluated. Cholelithiasis. Chronic diverticulosis without diverticulitis. Enlarged prostate.   HISTORY OF PRESENT ILLNESS: This 81 year old Caucasian man presents in the Emergency Room complaining of progressive difficulty urinating, as well as right flank pain. He has a history of nephrolithiasis in the past and associated these symptoms with possible recurrence. Denies any hematuria. Has had some subjective fevers. No history of coronary artery disease. No chest pain.   HOSPITAL COURSE BY PROBLEM:  1.  Right ureteral obstruction: The patient was seen by Dr. Deatra Ina of urology and had cystoscopy, pyelogram, and stent placement with relief of hydronephrosis. He is no longer in pain. He is urinating easily. He is discharged with follow  up with urology.  2.  Pyelonephritis, urinary tract infection with ESBL Escherichia coli. The patient was seen by Dr. Ola Spurr, who recommended fosfomycin 3 grams every 3 days for 4 additional doses, an additional 12 days of antibiotics. The patient was informed that if his symptoms worsened, if he experiences any dysuria, hematuria, fevers, chills, nausea, or vomiting, he would likely need to return to the hospital for PICC line placement and treatment with IV ertapenem for this infection. The patient was very eager for discharge and agreed to this plan.  3.  Transient hypoxia after surgical procedure. Immediately after cystoscopy, Stephen Taylor experienced a brief hypoxia to oxygen saturations of 83, which rose to the 90s with 2 liters of nasal cannula. This was temporary and within 24 hours, had resolved.   LABORATORY DATA: Sodium 143, potassium 4.6, chloride 102, bicarbonate 29, BUN 16, creatinine 0.8, glucose 96. LFTs normal, except for a decreased albumin at 2.9. White blood cells 7.5, hemoglobin 11.2, MCV 89. Urine culture shows greater than 100,000 colony-forming units of ESBL Escherichia coli. UA was initially 12 red blood cells and 74 white cells, repeat post procedure is with 190 red blood cells, 98 white blood cells.   CONDITION ON DISCHARGE: Stable.   PHYSICAL EXAMINATION: DISCHARGE VITAL SIGNS: Temperature 98.1, pulse 70, respirations 18, blood pressure 128/57, oxygenation 96% on room air.  GENERAL: No acute distress.  HEENT: The patient does have a chronic indwelling trach. He is able to drink liquids, but unable to eat solid foods due to this.  RESPIRATORY: Lungs clear to auscultation bilaterally with good air movement.  CARDIOVASCULAR: Regular rate and rhythm. No murmurs, rubs, or gallops. No peripheral edema. Peripheral pulses 1+.  ABDOMEN: The patient does have a PEG tube in place and gives himself tube  feeds. Abdomen is soft, nontender bowel sounds are normal.  PSYCHIATRIC: The  patient is alert, oriented, and has good insight into his clinical condition. He is very anxious for discharge, but I think that he understands the importance of returning to the Emergency Room should he have any further symptomology.   DISCHARGE MEDICATIONS: 1. Folic acid 1 mg 1 tablet daily.  2. Lorazepam 0.5 mg 2 tablets every 12 hours.  3. Fluoxetine 20 mg 1 tablet once a day.  4. DuoNeb 1 dose 4 times a day as needed.  5. Hemocyte tablet 324 mg 1 tablet once a day.  6. Ambien 5 mg 1 tablet once a day.  7. Pantoprazole 20 mg 2 tablets once a day.  8. Montelukast 10 mg 1 tablet once a day.  9. Centrax, multiple vitamin 1 capsule once a day.  10. Tamsulosin 0.4 mg 1 capsule once a day.  11. Acetaminophen/hydrocodone 325/5 mg 3 times a day as needed for pain.  12. Loratadine 10 mg 1 tablet once a day.  13. Albuterol ipratropium 2.5 mg/0.5 mg in 3 mL inhaled every 6 hours as needed for shortness of breath.  14. Fosfomycin 3 grams oral powder for reconstitution 1 packet every 3 days x 4. Prescriptions printed placed chart.   DISCHARGE INSTRUCTIONS: DIET: Continue with tube feeds.   ACTIVITY: No restrictions, as tolerated.   CONDITION ON DISCHARGE: Stable.   DISPOSITION: The patient is being discharged to home with no home health needs.   FOLLOWUP APPOINTMENTS: The patient will follow up with Dr. Deatra Ina in neurology in 2 weeks and will need to follow up with his primary care physician in 2 weeks, as well.   TIME SPENT ON DISCHARGE: 40 minutes.    ____________________________ Earleen Newport. Volanda Napoleon, MD cpw:mw D: 06/27/2014 40:10:27 ET T: 06/27/2014 18:58:07 ET JOB#: [id number]  cc: Barnetta Chapel P. Volanda Napoleon, MD, <Dictator> Aldean Jewett MD ELECTRONICALLY SIGNED 06/29/2014 20:09

## 2014-10-31 ENCOUNTER — Encounter: Payer: Self-pay | Admitting: Emergency Medicine

## 2014-10-31 ENCOUNTER — Emergency Department
Admission: EM | Admit: 2014-10-31 | Discharge: 2014-10-31 | Disposition: A | Discharge status: Home / Self Care | Payer: Medicare Other | Attending: Emergency Medicine | Admitting: Emergency Medicine

## 2014-10-31 DIAGNOSIS — K9423 Gastrostomy malfunction: Secondary | ICD-10-CM | POA: Diagnosis present

## 2014-10-31 DIAGNOSIS — Z431 Encounter for attention to gastrostomy: Secondary | ICD-10-CM | POA: Diagnosis not present

## 2014-10-31 NOTE — Discharge Instructions (Signed)
° °  Gastric Tube Replacement °You are having your gastric tube (the tube that goes into the stomach) changed. This is usually a very minor procedure. If medications are prescribed, take them as directed. Only take over-the-counter or prescription medications for pain, discomfort, or fever as directed by your caregiver.  °SEEK IMMEDIATE MEDICAL CARE IF:  °· You develop chills, fever, or show signs of generalized illness. °· You develop bleeding around the tube. °· Your new tube does not seem to be working properly. °· You are unable to get feedings into the tube. °· Your tube comes out for any reason. °Document Released: 03/08/2001 Document Revised: 09/05/2011 Document Reviewed: 06/13/2005 °[employer]® Patient Information ©2015 [employer]. This information is not intended to replace advice given to you by your health care provider. Make sure you discuss any questions you have with your health care provider. ° °

## 2014-10-31 NOTE — ED Notes (Signed)
States he needs his feeding tube replaced  It is leaking

## 2014-10-31 NOTE — ED Provider Notes (Signed)
[facility] Emergency Department Provider Note  ____________________________________________  Time seen: 1750  I have reviewed the triage vital signs and the nursing notes.   HISTORY  Chief Complaint GI Problem  leakage around his G-tube. Patient seeking replacement for this old G-tube.    HPI Stephen Taylor is a 81 y.o. male who has had a G-tube in place for a long time. The last time this one was changed was 9 months ago. He presents seeking replacement of the G-tube because he has leakage around the G-tube with oozing onto his abdominal wall. The skin is becoming a little bit irritated area. He has no pain in his abdomen. He is tolerating feeds adequately. He is in no acute distress. He has wanted to get this G-tube replaced for some time. He reports his doctors not able to help him with this and because of this he presented to the emergency department.   History reviewed. No pertinent past medical history. Patient has a history of a G-tube. He has a tracheostomy as well.   There are no active problems to display for this patient.   History reviewed. No pertinent past surgical history.  No current outpatient prescriptions on file.  Allergies Morphine and related  History reviewed. No pertinent family history.  Social History History  Substance Use Topics  . Smoking status: Never Smoker   . Smokeless tobacco: Not on file  . Alcohol Use: No    Review of Systems Constitutional: Negative for fever. ENT: Patient is notable for having a tracheostomy Cardiovascular: Negative for chest pain. Respiratory: Negative for shortness of breath. Gastrointestinal: Negative for abdominal pain, vomiting and diarrhea. He does have an old G-tube in place. Musculoskeletal: Negative for back pain. Skin: Negative for rash. Neurological: Negative for headaches  10-point ROS otherwise negative.  ____________________________________________   PHYSICAL  EXAM:  VITAL SIGNS: ED Triage Vitals  Enc Vitals Group     BP 10/31/14 1313 132/60 mmHg     Pulse Rate 10/31/14 1313 82     Resp 10/31/14 1313 20     Temp 10/31/14 1313 97 F (36.1 C)     Temp Source 10/31/14 1313 Oral     SpO2 10/31/14 1313 96 %     Weight 10/31/14 1313 190 lb (86.183 kg)     Height 10/31/14 1313 5\' 11"  (1.803 m)     Head Cir --      Peak Flow --      Pain Score --      Pain Loc --      Pain Edu? --      Excl. in [place]? --     Constitutional: Alert and oriented. Well appearing and in no distress. ENT   Head: Normocephalic and atraumatic.   Nose: No congestion/rhinnorhea.   Mouth/Throat: Mucous membranes are moist. Cardiovascular: Normal rate, regular rhythm. Respiratory: Normal respiratory effort without tachypnea. Breath sounds are clear and equal bilaterally. No wheezes/rales/rhonchi. Gastrointestinal: Soft and nontender. No distention.  There is a very old G-tube in place in the upper left abdomen. The patient has placed tape at various positions on the tube to help avoid leaking out some of the ports. There is notable leakage around the G-tube itself at the stoma. Musculoskeletal: Nontender with normal range of motion in all extremities.  No noted edema. Neurologic:  Normal speech and language. No gross focal neurologic deficits are appreciated.  Skin:  Skin is warm, dry. There is mild irritation around the stoma. This is  minimal and requires no immediate treatment. Psychiatric: Mood and affect are normal. Speech and behavior are normal.  ____________________________________________     INITIAL IMPRESSION / ASSESSMENT AND PLAN / ED COURSE  We'll replace the patient's G-tube with a 22 [place], same as he currently has. ____________________________________________   PROCEDURES  Procedure(s) performed: g-tube replacement  Critical Care performed: No  ____________________________________________   After removing approximately 8 mL of saline  from the balloon port for the patient's G-tube it was easily removed. Of note is that the tube was rather superficial. The stoma is notably enlarged. There is mild irritation around the area without sign of acute infection. The area was cleaned. A new 22 [place] G-tube was placed very easily. The balloon was inflated with 20 mL's of saline. Gastric contents immediately came back up through the main portion of the tube. This was aspirated as well and then the tube was flushed. The tube appears to be working well. ____________________________________________   FINAL CLINICAL IMPRESSION(S) / ED DIAGNOSES  Final diagnoses:  Attention to G-tube   replacement of G-tube    Ahmed Prima, MD 10/31/14 1859

## 2014-11-06 ENCOUNTER — Emergency Department
Admission: EM | Admit: 2014-11-06 | Discharge: 2014-11-06 | Disposition: A | Discharge status: Home / Self Care | Payer: Medicare Other | Attending: Emergency Medicine | Admitting: Emergency Medicine

## 2014-11-06 ENCOUNTER — Encounter: Payer: Self-pay | Admitting: *Deleted

## 2014-11-06 ENCOUNTER — Emergency Department: Payer: Medicare Other

## 2014-11-06 ENCOUNTER — Other Ambulatory Visit: Payer: Self-pay

## 2014-11-06 DIAGNOSIS — R0602 Shortness of breath: Secondary | ICD-10-CM | POA: Diagnosis present

## 2014-11-06 DIAGNOSIS — J441 Chronic obstructive pulmonary disease with (acute) exacerbation: Secondary | ICD-10-CM | POA: Insufficient documentation

## 2014-11-06 DIAGNOSIS — R0902 Hypoxemia: Secondary | ICD-10-CM | POA: Insufficient documentation

## 2014-11-06 DIAGNOSIS — Z87891 Personal history of nicotine dependence: Secondary | ICD-10-CM | POA: Diagnosis not present

## 2014-11-06 HISTORY — DX: Chronic obstructive pulmonary disease, unspecified: J44.9

## 2014-11-06 HISTORY — DX: Malignant (primary) neoplasm, unspecified: C80.1

## 2014-11-06 HISTORY — DX: Atherosclerotic heart disease of native coronary artery without angina pectoris: I25.10

## 2014-11-06 HISTORY — DX: Cerebral infarction, unspecified: I63.9

## 2014-11-06 LAB — CBC
HCT: 32.5 % — ABNORMAL LOW (ref 40.0–52.0)
Hemoglobin: 9.9 g/dL — ABNORMAL LOW (ref 13.0–18.0)
MCH: 22.9 pg — ABNORMAL LOW (ref 26.0–34.0)
MCHC: 30.5 g/dL — ABNORMAL LOW (ref 32.0–36.0)
MCV: 75.1 fL — ABNORMAL LOW (ref 80.0–100.0)
Platelets: 305 10*3/uL (ref 150–440)
RBC: 4.34 MIL/uL — AB (ref 4.40–5.90)
RDW: 15.5 % — ABNORMAL HIGH (ref 11.5–14.5)
WBC: 10 10*3/uL (ref 3.8–10.6)

## 2014-11-06 LAB — BASIC METABOLIC PANEL
Anion gap: 11 (ref 5–15)
BUN: 29 mg/dL — AB (ref 6–20)
CO2: 28 mmol/L (ref 22–32)
Calcium: 8.5 mg/dL — ABNORMAL LOW (ref 8.9–10.3)
Chloride: 100 mmol/L — ABNORMAL LOW (ref 101–111)
Creatinine, Ser: 0.9 mg/dL (ref 0.61–1.24)
GFR calc Af Amer: >60 mL/min (ref >60)
GFR calc non Af Amer: >60 mL/min (ref >60)
GLUCOSE: 132 mg/dL — AB (ref 65–99)
Potassium: 4.5 mmol/L (ref 3.5–5.1)
Sodium: 139 mmol/L (ref 135–145)

## 2014-11-06 LAB — BRAIN NATRIURETIC PEPTIDE: B NATRIURETIC PEPTIDE 5: 128 pg/mL — AB (ref 0.0–100.0)

## 2014-11-06 LAB — TROPONIN I: TROPONIN I: 0.11 ng/mL — AB (ref <0.031)

## 2014-11-06 MED ORDER — IPRATROPIUM-ALBUTEROL 0.5-2.5 (3) MG/3ML IN SOLN
RESPIRATORY_TRACT | Status: AC
  Administered 2014-11-06: 3 mL via RESPIRATORY_TRACT
  Filled 2014-11-06: qty 6

## 2014-11-06 MED ORDER — IPRATROPIUM-ALBUTEROL 0.5-2.5 (3) MG/3ML IN SOLN
3.0000 mL | Freq: Once | RESPIRATORY_TRACT | Status: AC
  Administered 2014-11-06: 3 mL via RESPIRATORY_TRACT

## 2014-11-06 MED ORDER — PREDNISONE 20 MG PO TABS
60.0000 mg | ORAL_TABLET | Freq: Once | ORAL | Status: AC
  Administered 2014-11-06: 60 mg via ORAL

## 2014-11-06 MED ORDER — PREDNISONE 10 MG PO TABS: 50.0000 mg | ORAL_TABLET | Freq: Every day | ORAL | Status: DC

## 2014-11-06 MED ORDER — FUROSEMIDE 20 MG PO TABS: 20.0000 mg | ORAL_TABLET | Freq: Every day | ORAL | Status: DC

## 2014-11-06 MED ORDER — PREDNISONE 20 MG PO TABS
ORAL_TABLET | ORAL | Status: AC
  Administered 2014-11-06: 60 mg via ORAL
  Filled 2014-11-06: qty 3

## 2014-11-06 MED ORDER — FUROSEMIDE 10 MG/ML IJ SOLN
40.0000 mg | Freq: Once | INTRAMUSCULAR | Status: AC
  Administered 2014-11-06: 40 mg via INTRAVENOUS

## 2014-11-06 MED ORDER — IPRATROPIUM-ALBUTEROL 0.5-2.5 (3) MG/3ML IN SOLN
RESPIRATORY_TRACT | Status: AC
  Administered 2014-11-06: 3 mL via RESPIRATORY_TRACT
  Filled 2014-11-06: qty 3

## 2014-11-06 MED ORDER — FUROSEMIDE 10 MG/ML IJ SOLN
INTRAMUSCULAR | Status: AC
  Administered 2014-11-06: 40 mg via INTRAVENOUS
  Filled 2014-11-06: qty 4

## 2014-11-06 NOTE — ED Notes (Signed)
RT to bedside to suction the pt. Pt had removed humidified trach collar that was placed previously by RN because he said it irritates his throat. Pt saturations noted to be 87-89% on room air at rest, dr. Reita Cliche updated on the same. Updated pt and family on plan at this time

## 2014-11-06 NOTE — Discharge Instructions (Signed)
You were evaluated for shortness of breath and found to have wheezing consistent with COPD and her being started on prednisone and continuing on albuterol. Use albuterol nebulized machine every 4 hours as needed for wheezing or shortness of breath. Follow-up with your pulmonologist and or primary care doctor next week. There is also some evidence of pulmonary edema which is fluid on the lungs associated with congestive heart failure. You're being given 2 days of Lasix to help with fluid. Return to the emergency department for any new or worsening shortness of breath, chest pain, trouble breathing, altered mental status, or any other symptoms concerning to you.  Chronic Obstructive Pulmonary Disease Exacerbation  Chronic obstructive pulmonary disease (COPD) is a common lung problem. In COPD, the flow of air from the lungs is limited. COPD exacerbations are times that breathing gets worse and you need extra treatment. Without treatment they can be life threatening. If they happen often, your lungs can become more damaged. HOME CARE  Do not smoke.  Avoid tobacco smoke and other things that bother your lungs.  If given, take your antibiotic medicine as told. Finish the medicine even if you start to feel better.  Only take medicines as told by your doctor.  Drink enough fluids to keep your pee (urine) clear or pale yellow (unless your doctor has told you not to).  Use a cool mist machine (vaporizer).  If you use oxygen or a machine that turns liquid medicine into a mist (nebulizer), continue to use them as told.  Keep up with shots (vaccinations) as told by your doctor.  Exercise regularly.  Eat healthy foods.  Keep all doctor visits as told. GET HELP RIGHT AWAY IF:  You are very short of breath and it gets worse.  You have trouble talking.  You have bad chest pain.  You have blood in your spit (sputum).  You have a fever.  You keep throwing up (vomiting).  You feel weak, or you  pass out (faint).  You feel confused.  You keep getting worse. MAKE SURE YOU:   Understand these instructions. Heart Failure Heart failure is a condition in which the heart has trouble pumping blood. This means your heart does not pump blood efficiently for your body to work well. In some cases of heart failure, fluid may back up into your lungs or you may have swelling (edema) in your lower legs. Heart failure is usually a long-term (chronic) condition. It is important for you to take good care of yourself and follow your health care provider's treatment plan. CAUSES  Some health conditions can cause heart failure. Those health conditions include: High blood pressure (hypertension). Hypertension causes the heart muscle to work harder than normal. When pressure in the blood vessels is high, the heart needs to pump (contract) with more force in order to circulate blood throughout the body. High blood pressure eventually causes the heart to become stiff and weak. Coronary artery disease (CAD). CAD is the buildup of cholesterol and fat (plaque) in the arteries of the heart. The blockage in the arteries deprives the heart muscle of oxygen and blood. This can cause chest pain and may lead to a heart attack. High blood pressure can also contribute to CAD. Heart attack (myocardial infarction). A heart attack occurs when one or more arteries in the heart become blocked. The loss of oxygen damages the muscle tissue of the heart. When this happens, part of the heart muscle dies. The injured tissue does not contract as  well and weakens the heart's ability to pump blood. Abnormal heart valves. When the heart valves do not open and close properly, it can cause heart failure. This makes the heart muscle pump harder to keep the blood flowing. Heart muscle disease (cardiomyopathy or myocarditis). Heart muscle disease is damage to the heart muscle from a variety of causes. These can include drug or alcohol abuse,  infections, or unknown reasons. These can increase the risk of heart failure. Lung disease. Lung disease makes the heart work harder because the lungs do not work properly. This can cause a strain on the heart, leading it to fail. Diabetes. Diabetes increases the risk of heart failure. High blood sugar contributes to high fat (lipid) levels in the blood. Diabetes can also cause slow damage to tiny blood vessels that carry important nutrients to the heart muscle. When the heart does not get enough oxygen and food, it can cause the heart to become weak and stiff. This leads to a heart that does not contract efficiently. Other conditions can contribute to heart failure. These include abnormal heart rhythms, thyroid problems, and low blood counts (anemia). Certain unhealthy behaviors can increase the risk of heart failure, including: Being overweight. Smoking or chewing tobacco. Eating foods high in fat and cholesterol. Abusing illicit drugs or alcohol. Lacking physical activity. SYMPTOMS  Heart failure symptoms may vary and can be hard to detect. Symptoms may include: Shortness of breath with activity, such as climbing stairs. Persistent cough. Swelling of the feet, ankles, legs, or abdomen. Unexplained weight gain. Difficulty breathing when lying flat (orthopnea). Waking from sleep because of the need to sit up and get more air. Rapid heartbeat. Fatigue and loss of energy. Feeling light-headed, dizzy, or close to fainting. Loss of appetite. Nausea. Increased urination during the night (nocturia). DIAGNOSIS  A diagnosis of heart failure is based on your history, symptoms, physical examination, and diagnostic tests. Diagnostic tests for heart failure may include: Echocardiography. Electrocardiography. Chest X-ray. Blood tests. Exercise stress test. Cardiac angiography. Radionuclide scans. TREATMENT  Treatment is aimed at managing the symptoms of heart failure. Medicines, behavioral  changes, or surgical intervention may be necessary to treat heart failure. Medicines to help treat heart failure may include: Angiotensin-converting enzyme (ACE) inhibitors. This type of medicine blocks the effects of a blood protein called angiotensin-converting enzyme. ACE inhibitors relax (dilate) the blood vessels and help lower blood pressure. Angiotensin receptor blockers (ARBs). This type of medicine blocks the actions of a blood protein called angiotensin. Angiotensin receptor blockers dilate the blood vessels and help lower blood pressure. Water pills (diuretics). Diuretics cause the kidneys to remove salt and water from the blood. The extra fluid is removed through urination. This loss of extra fluid lowers the volume of blood the heart pumps. Beta blockers. These prevent the heart from beating too fast and improve heart muscle strength. Digitalis. This increases the force of the heartbeat. Healthy behavior changes include: Obtaining and maintaining a healthy weight. Stopping smoking or chewing tobacco. Eating heart-healthy foods. Limiting or avoiding alcohol. Stopping illicit drug use. Physical activity as directed by your health care provider. Surgical treatment for heart failure may include: A procedure to open blocked arteries, repair damaged heart valves, or remove damaged heart muscle tissue. A pacemaker to improve heart muscle function and control certain abnormal heart rhythms. An internal cardioverter defibrillator to treat certain serious abnormal heart rhythms. A left ventricular assist device (LVAD) to assist the pumping ability of the heart. HOME CARE INSTRUCTIONS  Take medicines only  as directed by your health care provider. Medicines are important in reducing the workload of your heart, slowing the progression of heart failure, and improving your symptoms. Do not stop taking your medicine unless directed by your health care provider. Do not skip any dose of  medicine. Refill your prescriptions before you run out of medicine. Your medicines are needed every day. Engage in moderate physical activity if directed by your health care provider. Moderate physical activity can benefit some people. The elderly and people with severe heart failure should consult with a health care provider for physical activity recommendations. Eat heart-healthy foods. Food choices should be free of trans fat and low in saturated fat, cholesterol, and salt (sodium). Healthy choices include fresh or frozen fruits and vegetables, fish, lean meats, legumes, fat-free or low-fat dairy products, and whole grain or high fiber foods. Talk to a dietitian to learn more about heart-healthy foods. Limit sodium if directed by your health care provider. Sodium restriction may reduce symptoms of heart failure in some people. Talk to a dietitian to learn more about heart-healthy seasonings. Use healthy cooking methods. Healthy cooking methods include roasting, grilling, broiling, baking, poaching, steaming, or stir-frying. Talk to a dietitian to learn more about healthy cooking methods. Limit fluids if directed by your health care provider. Fluid restriction may reduce symptoms of heart failure in some people. Weigh yourself every day. Daily weights are important in the early recognition of excess fluid. You should weigh yourself every morning after you urinate and before you eat breakfast. Wear the same amount of clothing each time you weigh yourself. Record your daily weight. Provide your health care provider with your weight record. Monitor and record your blood pressure if directed by your health care provider. Check your pulse if directed by your health care provider. Lose weight if directed by your health care provider. Weight loss may reduce symptoms of heart failure in some people. Stop smoking or chewing tobacco. Nicotine makes your heart work harder by causing your blood vessels to  constrict. Do not use nicotine gum or patches before talking to your health care provider. Keep all follow-up visits as directed by your health care provider. This is important. Limit alcohol intake to no more than 1 drink per day for nonpregnant women and 2 drinks per day for men. One drink equals 12 ounces of beer, 5 ounces of wine, or 1 ounces of hard liquor. Drinking more than that is harmful to your heart. Tell your health care provider if you drink alcohol several times a week. Talk with your health care provider about whether alcohol is safe for you. If your heart has already been damaged by alcohol or you have severe heart failure, drinking alcohol should be stopped completely. Stop illicit drug use. Stay up-to-date with immunizations. It is especially important to prevent respiratory infections through current pneumococcal and influenza immunizations. Manage other health conditions such as hypertension, diabetes, thyroid disease, or abnormal heart rhythms as directed by your health care provider. Learn to manage stress. Plan rest periods when fatigued. Learn strategies to manage high temperatures. If the weather is extremely hot: Avoid vigorous physical activity. Use air conditioning or fans or seek a cooler location. Avoid caffeine and alcohol. Wear loose-fitting, lightweight, and light-colored clothing. Learn strategies to manage cold temperatures. If the weather is extremely cold: Avoid vigorous physical activity. Layer clothes. Wear mittens or gloves, a hat, and a scarf when going outside. Avoid alcohol. Obtain ongoing education and support as needed. Participate in or  seek rehabilitation as needed to maintain or improve independence and quality of life. SEEK MEDICAL CARE IF:  Your weight increases by 03 lb/1.4 kg in 1 day or 05 lb/2.3 kg in a week. You have increasing shortness of breath that is unusual for you. You are unable to participate in your usual physical  activities. You tire easily. You cough more than normal, especially with physical activity. You have any or more swelling in areas such as your hands, feet, ankles, or abdomen. You are unable to sleep because it is hard to breathe. You feel like your heart is beating fast (palpitations). You become dizzy or light-headed upon standing up. SEEK IMMEDIATE MEDICAL CARE IF:  You have difficulty breathing. There is a change in mental status such as decreased alertness or difficulty with concentration. You have a pain or discomfort in your chest. You have an episode of fainting (syncope). MAKE SURE YOU:  Understand these instructions. Will watch your condition. Will get help right away if you are not doing well or get worse. Document Released: 06/13/2005 Document Revised: 10/28/2013 Document Reviewed: 07/13/2012 [facility] Patient Information 2015 [place], [place]. This information is not intended to replace advice given to you by your health care provider. Make sure you discuss any questions you have with your health care provider.   Will watch your condition.  Will get help right away if you are not doing well or get worse. Document Released: 06/02/2011 Document Revised: 04/03/2013 Document Reviewed: 02/15/2013 [facility] Patient Information 2015 [place], [place]. This information is not intended to replace advice given to you by your health care provider. Make sure you discuss any questions you have with your health care provider.

## 2014-11-06 NOTE — ED Notes (Signed)
States when he walksl has sob

## 2014-11-06 NOTE — ED Provider Notes (Addendum)
[facility] Emergency Department Provider Note   ____________________________________________  Time seen: 4:30 PM  I have reviewed the triage vital signs and the triage nursing note.   HISTORY  Chief Complaint Shortness of Breath   History limited by nothing patient provides his own history HPI Stephen Taylor is a 81 y.o. male who is complaining of shortness of breath and wheezing over a period of 2-3 days. States that he maybe has made it worse. He's had no chest pain. Shortness of breath occurs when he walks short distances and he feels like he is extremely short of breath. He does use alcohol no malaise or treatment at home. He took 2 today. He has had no fever. No abdominal pain no GI symptoms. No shortness of breath at rest. Symptoms with mild walking are moderate. No coughing or sputum      Past Medical History  Diagnosis Date  . Cancer   . COPD (chronic obstructive pulmonary disease)   . Coronary artery disease   . Stroke     There are no active problems to display for this patient.   Past Surgical History  Procedure Laterality Date  . Tracheostomy N/A   . Gastrostomy w/ feeding tube N/A     No current outpatient prescriptions on file.  Allergies Morphine and related  No family history on file.  Social History History  Substance Use Topics  . Smoking status: Former Research scientist (life sciences)  . Smokeless tobacco: Not on file  . Alcohol Use: No    Review of Systems  Constitutional: Negative for fever. Eyes: Negative for visual changes. ENT: Negative for sore throat. Cardiovascular: Negative for chest pain. Respiratory: No pleuritic chest pain Gastrointestinal: Negative for abdominal pain, vomiting and diarrhea. Genitourinary: Negative for dysuria. Musculoskeletal: Negative for back pain. Skin: Negative for rash. Neurological: Negative for headaches, focal weakness or numbness.   ____________________________________________   PHYSICAL  EXAM:  VITAL SIGNS: ED Triage Vitals  Enc Vitals Group     BP 11/06/14 1218 109/49 mmHg     Pulse Rate 11/06/14 1218 73     Resp --      Temp 11/06/14 1218 98.7 F (37.1 C)     Temp Source 11/06/14 1218 Oral     SpO2 11/06/14 1218 89 %     Weight --      Height --      Head Cir --      Peak Flow --      Pain Score 11/06/14 1222 0     Pain Loc --      Pain Edu? --      Excl. in [place]? --      Constitutional: Alert and oriented. Well appearing and in no distress. Eyes: Conjunctivae are normal. PERRL. Normal extraocular movements. ENT   Head: Normocephalic and atraumatic.   Nose: No congestion/rhinnorhea.   Mouth/Throat: Mucous membranes are moist.   Neck: No stridor. He does have a trach in place that he covers in order to talk Cardiovascular: Normal rate, regular rhythm.  No murmurs, rubs, or gallops. Respiratory: Normal respiratory effort without tachypnea nor retractions. He does have moderate wheezing in all lung fields and mild rhonchi in all lung fields. Gastrointestinal: Soft and nontender. No distention.  Genitourinary: Musculoskeletal: Nontender with normal range of motion in all extremities. No joint effusions.  No lower extremity tenderness nor edema. Neurologic:  Normal speech and language. No gross focal neurologic deficits are appreciated. Speech is normal. Skin:  Skin is warm,  dry and intact. No rash noted. Psychiatric: Mood and affect are normal. Speech and behavior are normal. Patient exhibits appropriate insight and judgment.  ____________________________________________   EKG   74 bpm normal sinus rhythm Narrow QRS Normal ST and T-wave  ____________________________________________   LABS (pertinent positives/negatives)  White blood count 10.0, hemoglobin 9.9, electrolyte panel without significant abnormality, BNP 128 Troponin 0.11 and repeat 0.08  ____________________________________________    RADIOLOGY Radiologist results  reviewed  Chest x-ray with mild new interstitial thickening may represent mild CHF imposed on COPD. Masses in both lungs consistent with prior CT  ____________________________________________   PROCEDURES  Procedure(s) performed:  Critical Care performed:   ____________________________________________   INITIAL IMPRESSION / ASSESSMENT AND PLAN / ED COURSE  Pertinent labs & imaging results that were available during my care of the patient were reviewed by me and considered in my medical decision making (see chart for details).  Patient's symptoms seem most consistent with COPD exacerbation and he will be treated as such. He received DuoNeb and prednisone in the emergency department.  Chest x-ray findings indicate some level of congestive heart failure and history with a dose of Lasix. After 3 DuoNeb patient was noted to be hypoxic around 86-89% at rest and intolerant of standing up with severe shortness of breath and increased respiratory rate with wheezing. He is on 3 L of trach collar oxygen at home and is requiring 6 L to maintain oxygenation here in the emergency department. Discussed with Dr. Posey Pronto with hospitalist for admission.    845 PM patient states he does not want stay in the emergency department or be admitted to the hospital. He states he can increase his oxygen at home. He is had no chest pain and his troponin repeat did trend down from 0.11 to 0.08. Patient understands return precautions and has a follow-up appointment tomorrow already scheduled. I will go ahead and discharge him home ____________________________________________   FINAL CLINICAL IMPRESSION(S) / ED DIAGNOSES  Acute COPD exacerbation with hypoxia  Abnormal troponin     Lisa Roca, MD 11/06/14 2037  Lisa Roca, MD 11/06/14 2045

## 2014-11-07 LAB — TROPONIN I: TROPONIN I: 0.08 ng/mL — AB (ref <0.031)

## 2014-11-11 ENCOUNTER — Other Ambulatory Visit: Payer: Self-pay | Admitting: Physician Assistant

## 2014-11-11 DIAGNOSIS — J189 Pneumonia, unspecified organism: Secondary | ICD-10-CM

## 2014-11-13 ENCOUNTER — Ambulatory Visit
Admission: RE | Admit: 2014-11-13 | Discharge: 2014-11-13 | Disposition: A | Discharge status: Home / Self Care | Payer: Medicare Other | Source: Ambulatory Visit | Attending: Physician Assistant | Admitting: Physician Assistant

## 2014-11-13 DIAGNOSIS — Z8701 Personal history of pneumonia (recurrent): Secondary | ICD-10-CM | POA: Diagnosis not present

## 2014-11-13 DIAGNOSIS — R918 Other nonspecific abnormal finding of lung field: Secondary | ICD-10-CM | POA: Diagnosis not present

## 2014-11-13 DIAGNOSIS — J449 Chronic obstructive pulmonary disease, unspecified: Secondary | ICD-10-CM | POA: Diagnosis not present

## 2014-11-13 DIAGNOSIS — J189 Pneumonia, unspecified organism: Secondary | ICD-10-CM

## 2014-12-24 ENCOUNTER — Ambulatory Visit
Admission: RE | Admit: 2014-12-24 | Discharge: 2014-12-24 | Disposition: A | Discharge status: Home / Self Care | Payer: Medicare Other | Source: Ambulatory Visit | Attending: Internal Medicine | Admitting: Internal Medicine

## 2014-12-24 ENCOUNTER — Encounter: Payer: Self-pay | Admitting: *Deleted

## 2014-12-24 ENCOUNTER — Encounter
Admission: RE | Disposition: A | Discharge status: Home / Self Care | Payer: Self-pay | Source: Ambulatory Visit | Attending: Internal Medicine

## 2014-12-24 DIAGNOSIS — B379 Candidiasis, unspecified: Secondary | ICD-10-CM | POA: Diagnosis not present

## 2014-12-24 DIAGNOSIS — Z87891 Personal history of nicotine dependence: Secondary | ICD-10-CM | POA: Diagnosis not present

## 2014-12-24 DIAGNOSIS — K2270 Barrett's esophagus without dysplasia: Secondary | ICD-10-CM | POA: Insufficient documentation

## 2014-12-24 DIAGNOSIS — X58XXXA Exposure to other specified factors, initial encounter: Secondary | ICD-10-CM | POA: Insufficient documentation

## 2014-12-24 DIAGNOSIS — B961 Klebsiella pneumoniae [K. pneumoniae] as the cause of diseases classified elsewhere: Secondary | ICD-10-CM | POA: Insufficient documentation

## 2014-12-24 DIAGNOSIS — R918 Other nonspecific abnormal finding of lung field: Secondary | ICD-10-CM | POA: Diagnosis present

## 2014-12-24 DIAGNOSIS — Z8673 Personal history of transient ischemic attack (TIA), and cerebral infarction without residual deficits: Secondary | ICD-10-CM | POA: Diagnosis not present

## 2014-12-24 DIAGNOSIS — Z8249 Family history of ischemic heart disease and other diseases of the circulatory system: Secondary | ICD-10-CM | POA: Insufficient documentation

## 2014-12-24 DIAGNOSIS — Z7982 Long term (current) use of aspirin: Secondary | ICD-10-CM | POA: Insufficient documentation

## 2014-12-24 DIAGNOSIS — R042 Hemoptysis: Secondary | ICD-10-CM | POA: Diagnosis present

## 2014-12-24 DIAGNOSIS — Z87442 Personal history of urinary calculi: Secondary | ICD-10-CM | POA: Diagnosis not present

## 2014-12-24 DIAGNOSIS — T17890A Other foreign object in other parts of respiratory tract causing asphyxiation, initial encounter: Secondary | ICD-10-CM | POA: Insufficient documentation

## 2014-12-24 DIAGNOSIS — M1990 Unspecified osteoarthritis, unspecified site: Secondary | ICD-10-CM | POA: Insufficient documentation

## 2014-12-24 DIAGNOSIS — Z8489 Family history of other specified conditions: Secondary | ICD-10-CM | POA: Diagnosis not present

## 2014-12-24 DIAGNOSIS — Z885 Allergy status to narcotic agent status: Secondary | ICD-10-CM | POA: Diagnosis not present

## 2014-12-24 DIAGNOSIS — G4730 Sleep apnea, unspecified: Secondary | ICD-10-CM | POA: Insufficient documentation

## 2014-12-24 DIAGNOSIS — Z79899 Other long term (current) drug therapy: Secondary | ICD-10-CM | POA: Insufficient documentation

## 2014-12-24 DIAGNOSIS — Z930 Tracheostomy status: Secondary | ICD-10-CM | POA: Diagnosis not present

## 2014-12-24 DIAGNOSIS — Z7951 Long term (current) use of inhaled steroids: Secondary | ICD-10-CM | POA: Insufficient documentation

## 2014-12-24 DIAGNOSIS — F329 Major depressive disorder, single episode, unspecified: Secondary | ICD-10-CM | POA: Diagnosis not present

## 2014-12-24 DIAGNOSIS — Z9889 Other specified postprocedural states: Secondary | ICD-10-CM | POA: Insufficient documentation

## 2014-12-24 DIAGNOSIS — N400 Enlarged prostate without lower urinary tract symptoms: Secondary | ICD-10-CM | POA: Insufficient documentation

## 2014-12-24 DIAGNOSIS — T7840XA Allergy, unspecified, initial encounter: Secondary | ICD-10-CM | POA: Insufficient documentation

## 2014-12-24 DIAGNOSIS — J449 Chronic obstructive pulmonary disease, unspecified: Secondary | ICD-10-CM | POA: Insufficient documentation

## 2014-12-24 DIAGNOSIS — Z8614 Personal history of Methicillin resistant Staphylococcus aureus infection: Secondary | ICD-10-CM | POA: Diagnosis not present

## 2014-12-24 DIAGNOSIS — Z79891 Long term (current) use of opiate analgesic: Secondary | ICD-10-CM | POA: Diagnosis not present

## 2014-12-24 DIAGNOSIS — Z85118 Personal history of other malignant neoplasm of bronchus and lung: Secondary | ICD-10-CM | POA: Diagnosis not present

## 2014-12-24 HISTORY — DX: Benign prostatic hyperplasia without lower urinary tract symptoms: N40.0

## 2014-12-24 HISTORY — PX: FLEXIBLE BRONCHOSCOPY: SHX5094

## 2014-12-24 HISTORY — DX: Barrett's esophagus without dysplasia: K22.70

## 2014-12-24 HISTORY — DX: Pneumonia due to methicillin resistant Staphylococcus aureus: J15.212

## 2014-12-24 HISTORY — DX: Unspecified osteoarthritis, unspecified site: M19.90

## 2014-12-24 HISTORY — DX: Major depressive disorder, single episode, unspecified: F32.9

## 2014-12-24 HISTORY — DX: Depression, unspecified: F32.A

## 2014-12-24 HISTORY — DX: Sleep apnea, unspecified: G47.30

## 2014-12-24 HISTORY — DX: Calculus of kidney: N20.0

## 2014-12-24 SURGERY — BRONCHOSCOPY, FLEXIBLE
Anesthesia: General

## 2014-12-24 MED ORDER — MIDAZOLAM HCL 5 MG/ML IJ SOLN
1.0000 mg | Freq: Once | INTRAMUSCULAR | Status: AC
  Administered 2014-12-24: 1 mg via INTRAVENOUS

## 2014-12-24 MED ORDER — FENTANYL CITRATE (PF) 100 MCG/2ML IJ SOLN
INTRAMUSCULAR | Status: AC
  Administered 2014-12-24: 50 ug via INTRAVENOUS
  Filled 2014-12-24: qty 4

## 2014-12-24 MED ORDER — PHENYLEPHRINE HCL 0.25 % NA SOLN
1.0000 | Freq: Four times a day (QID) | NASAL | Status: DC | PRN
  Filled 2014-12-24: qty 15

## 2014-12-24 MED ORDER — BUTAMBEN-TETRACAINE-BENZOCAINE 2-2-14 % EX AERO
1.0000 | INHALATION_SPRAY | Freq: Once | CUTANEOUS | Status: DC
  Filled 2014-12-24: qty 20

## 2014-12-24 MED ORDER — LIDOCAINE HCL 2 % EX GEL
1.0000 "application " | Freq: Once | CUTANEOUS | Status: DC
  Filled 2014-12-24: qty 5

## 2014-12-24 MED ORDER — MIDAZOLAM HCL 5 MG/5ML IJ SOLN
INTRAMUSCULAR | Status: AC
  Administered 2014-12-24: 2 mg via INTRAVENOUS
  Filled 2014-12-24: qty 10

## 2014-12-24 MED ORDER — SODIUM CHLORIDE 0.9 % IV SOLN
INTRAVENOUS | Status: DC
  Administered 2014-12-24: 12:00:00 via INTRAVENOUS

## 2014-12-24 NOTE — Discharge Instructions (Signed)
ar

## 2014-12-24 NOTE — Op Note (Signed)
[facility] Patient Name: Stephen Taylor Procedure Date: 12/24/2014 11:33 AM MRN: [id number] Account #: [id number] Date of Birth: Mar 05, 1932 Admit Type: Outpatient Age: 81 Room: Bronch Suite on 2nd floor Gender: Male Note Status: Finalized Attending MD: Allyne Gee, MD Procedure:         Bronchoscopy Indications:       Hemoptysis with abnormal CXR, Mucous plug Providers:         Allyne Gee, MD, Eather Colas, Technician (Technician) Referring MD:       Medicines:         Midazolam 2 mg IV, Fentanyl 50 mcg IV Complications:     No immediate complications Procedure:         Pre-Anesthesia Assessment:                    - A History and Physical has been performed. The patient's                     medications, allergies and sensitivities have been                     reviewed.                    - The risks and benefits of the procedure and the sedation                     options and risks were discussed with the patient. All                     questions were answered and informed consent was obtained.                    - Pre-procedure physical examination revealed no                     contraindications to sedation.                    - ASA Grade Assessment: II - A patient with mild systemic                     disease.                    - After reviewing the risks and benefits, the patient was                     deemed in satisfactory condition to undergo the procedure.                    - The anesthesia plan was to use moderate                     sedation/analgesia.                    - Immediately prior to administration of medications, the                     patient was re-assessed for adequacy to receive sedatives.                    - The heart rate, respiratory rate, oxygen saturations,                     blood pressure, adequacy  of pulmonary ventilation, and                     response to care were monitored throughout the procedure.             - The physical status of the patient was re-assessed after                     the procedure.                    After obtaining informed consent, the bronchoscope was                     passed under direct vision. Throughout the procedure, the                     patient's blood pressure, pulse, and oxygen saturations                     were monitored continuously. the Bronchoscope Olympus                     BF-Q180 S# [id number] was introduced through the tracheostomy                     tube and advanced to the tracheobronchial tree of both                     lungs. The procedure was accomplished without difficulty.                     The patient tolerated the procedure well. The total                     duration of the procedure was 15 minutes. Findings:      Left Lung Abnormalities: Mucous, plugging the airway, was found in the       left lower lobe. The mucous was copious, mucoid and tenacious. The       underlying mucosa is erythematous. Washings were obtained in the left       lower lobe of the lung and sent for cell count, bacterial culture, viral       smears & culture, and fungal & AFB analysis and cytology. The return was       mucopurulent.      Right Lung Abnormalities: Notable secretions were found throughout the       right tracheobronchial tree. They were not obstructing the airway. Impression:        - Hemoptysis with abnormal CXR                    - A mucous plug was found in the left lower lobe.                    - Washings were obtained.                    - Notable secretions were found throughout the                     tracheobronchial tree. Recommendation:    - Await culture and washing results. Devona Konig, MD Allyne Gee, MD 12/24/2014 12:58:57 PM This report has been signed electronically. Number of Addenda: 0 Note Initiated On: 12/24/2014 11:33 AM  [facility]

## 2014-12-24 NOTE — Anesthesia Preprocedure Evaluation (Deleted)
Anesthesia Evaluation  Patient identified by MRN, date of birth, ID band  Airway        Dental   Pulmonary former smoker,          Cardiovascular     Neuro/Psych    GI/Hepatic   Endo/Other    Renal/GU      Musculoskeletal   Abdominal   Peds  Hematology   Anesthesia Other Findings   Reproductive/Obstetrics                             Anesthesia Physical Anesthesia Plan Anesthesia Quick Evaluation

## 2014-12-24 NOTE — H&P (Signed)
Patient presents for bronchoscopy due to ongoing copius secretions pneumonia and cough with a chronic trach. H&P scanned in chart no changes since the last evaluation

## 2014-12-27 LAB — CULTURE, RESPIRATORY: SPECIAL REQUESTS: NORMAL

## 2014-12-27 LAB — CULTURE, RESPIRATORY W GRAM STAIN

## 2015-01-07 ENCOUNTER — Other Ambulatory Visit: Payer: Self-pay | Admitting: Internal Medicine

## 2015-01-07 DIAGNOSIS — R1310 Dysphagia, unspecified: Secondary | ICD-10-CM

## 2015-01-13 ENCOUNTER — Ambulatory Visit
Admission: RE | Admit: 2015-01-13 | Discharge: 2015-01-13 | Disposition: A | Discharge status: Home / Self Care | Payer: Medicare Other | Source: Ambulatory Visit | Attending: Internal Medicine | Admitting: Internal Medicine

## 2015-01-13 DIAGNOSIS — Z930 Tracheostomy status: Secondary | ICD-10-CM | POA: Diagnosis not present

## 2015-01-13 DIAGNOSIS — M1990 Unspecified osteoarthritis, unspecified site: Secondary | ICD-10-CM | POA: Insufficient documentation

## 2015-01-13 DIAGNOSIS — R1310 Dysphagia, unspecified: Secondary | ICD-10-CM | POA: Insufficient documentation

## 2015-01-13 DIAGNOSIS — N400 Enlarged prostate without lower urinary tract symptoms: Secondary | ICD-10-CM | POA: Diagnosis not present

## 2015-01-13 DIAGNOSIS — J449 Chronic obstructive pulmonary disease, unspecified: Secondary | ICD-10-CM | POA: Insufficient documentation

## 2015-01-13 DIAGNOSIS — R1313 Dysphagia, pharyngeal phase: Secondary | ICD-10-CM

## 2015-01-13 NOTE — Therapy (Addendum)
[place], [place], [address] Phone: [phone]   Fax:     Modified Barium Swallow  Patient Details  Name: Stephen Taylor MRN: [id number] Date of Birth: 12/22/1931 Referring Provider:  Allyne Gee, MD  Encounter Date: 01/13/2015   Subjective: Patient behavior: (alertness, ability to follow instructions, etc.): alert, verbally conversive, pleasant. Pt gave detailed h/o his Dysphagia and the need for his PEG placement for nutrition/hydration ~10+ years ago. Pt has been using TFs to meet his nutritional needs. He described that when he has attempted to chew gum or eat a piece of candy, he has "choked" on the increased saliva forming in his mouth. Pt stated he has not taken any food/liquid orally since having the PEG placed "many years ago"; "I cannot or I will aspirate". Pt also stated he has tolerated the TFs via PEG adequately "all these years". Chief complaint: "I cannot swallow"; pt has a PEG for nutrition/hydration. He stated his insurance company "has stopped paying for the TFs".    Objective:  Radiological Procedure: A videoflouroscopic evaluation of oral-preparatory, reflex initiation, and pharyngeal phases of the swallow was performed; as well as a screening of the upper esophageal phase.  I. POSTURE: upright II. VIEW: lateral III. COMPENSATORY STRATEGIES: attempted to use f/u, multiple swallows in attempt to clear pharyngeal reside of bolus material swallowed IV. BOLUSES ADMINISTERED:  Thin Liquid: n/a  Nectar-thick Liquid: 2 tsps  Honey-thick Liquid: n/a  Puree: n/a  Mechanical Soft: n/a V. RESULTS OF EVALUATION: A. ORAL PREPARATORY PHASE: (The lips, tongue, and velum are observed for strength and coordination)       **Overall Severity Rating: WFL. No A-P transfer deficits noted w/ the 2 tsps trials given.  B. SWALLOW INITIATION/REFLEX: (The reflex is normal if "triggered" by the time the  bolus reached the base of the tongue)  **Overall Severity Rating: IMPAIRED. Bolus material(nectar consistency liquid via tsp) spilled into the pharynx past the level of the valleculae to the pyriform sinuses b/f the initiation of the pharyngeal swallow was initiated.  C. PHARYNGEAL PHASE: (Pharyngeal function is normal if the bolus shows rapid, smooth, and continuous transit through the pharynx and there is no pharyngeal residue after the swallow)  **Overall Severity Rating: IMPAIRED. Bolus residue remained throughout the pharynx and did not clear the pharynx(into the Esophagus) despite f/u, multiple swallows attempted. Pt exhibited decreased laryngeal excursion and decreased pharyngeal pressure necessary to clear the pharynx of the bolus material. As he attempted f/u swallows, bolus residue was noted to enter the laryngeal vestibule as penetration, then aspirated as noted by Radiologist.   D. LARYNGEAL PENETRATION: (Material entering into the laryngeal inlet/vestibule but not aspirated): present w/ tsp trials of nectar consistency liquid E. ASPIRATION: occurred following laryngeal penetration of the bolus material F. ESOPHAGEAL PHASE: (Screening of the upper esophagus)  ASSESSMENT: Pt presented w/ severe pharyngeal phase dysphagia c/b decreased laryngeal excursion and decreased pharyngeal pressure adequate for appropriate bolus transit through the pharynx resulting in severe pharyngeal residue of bolus material of the 2 tsp trials of nectar liquids. Due to this, laryngeal penetration, followed by aspiration, occurred from the bolus trials attempted. Pt was able to aid airway protection by coughing hard and expectorating in attempts to clear the laryngeal penetration w/ some decrease of the pharyngeal residue noted. Due to this presentation and aspiration noted, no further po trials were attempted sec. to high risk for aspiration. See Radiologist report.    PLAN/RECOMMENDATIONS:  A. Diet: NPO;  continuation of alternative means of feeding(PEG) is rec'd  B. Swallowing Precautions: n/a  C. Recommended consultation: n/a  D. Therapy recommendations: thorough oral care daily  E. Results and recommendations were discussed w/ pt thoroughly w/ Radiologist present as well.     Past Medical History  Diagnosis Date  . Cancer   . COPD (chronic obstructive pulmonary disease)   . Coronary artery disease   . Stroke   . Depression   . Sleep apnea   . MRSA pneumonia   . Barrett's esophagus   . BPH (benign prostatic hyperplasia)   . Nephrolithiasis   . Osteoarthritis     Past Surgical History  Procedure Laterality Date  . Tracheostomy N/A   . Gastrostomy w/ feeding tube N/A   . Laminectomy    . Rotator cuff repair    . Lung removal, partial    . Esophageogastrectomy    . Peg tube placement    . Renal calculi    . Ureteral stent placement      There were no vitals filed for this visit.  Visit Diagnosis: Dysphagia, pharyngeal phase  Dysphagia - Plan: DG OP Swallowing Func-Medicare/Speech Path, DG OP Swallowing Func-Medicare/Speech Path                               G-Codes - 01-28-15 1546    Functional Assessment Tool Used clinical judgement; MBSS   Functional Limitations Swallowing   Swallow Current Status (Q3335) At least 80 percent but less than 100 percent impaired, limited or restricted   Swallow Goal Status (K5625) At least 80 percent but less than 100 percent impaired, limited or restricted   Swallow Discharge Status [phone]) At least 80 percent but less than 100 percent impaired, limited or restricted          Problem List There are no active problems to display for this patient.  Orinda Kenner, [place], CCC-SLP Watson,Katherine 01-28-15, 3:47 PM  [facility] [place], [place], [address] Phone: [phone]   Fax:

## 2015-01-15 LAB — FUNGUS CULTURE W SMEAR

## 2015-02-06 LAB — ACID FAST SMEAR+CULTURE W/RFLX (ARMC ONLY)
ACID FAST CULTURE: NEGATIVE
Acid Fast Smear: NEGATIVE

## 2015-03-06 ENCOUNTER — Encounter: Payer: Self-pay | Admitting: Internal Medicine

## 2015-04-06 ENCOUNTER — Emergency Department: Payer: Medicare Other

## 2015-04-06 ENCOUNTER — Encounter: Payer: Self-pay | Admitting: Emergency Medicine

## 2015-04-06 ENCOUNTER — Inpatient Hospital Stay
Admission: EM | Admit: 2015-04-06 | Discharge: 2015-04-09 | DRG: 190 | Disposition: A | Discharge status: Home Health | Payer: Medicare Other | Attending: Specialist | Admitting: Specialist

## 2015-04-06 DIAGNOSIS — F419 Anxiety disorder, unspecified: Secondary | ICD-10-CM | POA: Diagnosis present

## 2015-04-06 DIAGNOSIS — Z930 Tracheostomy status: Secondary | ICD-10-CM

## 2015-04-06 DIAGNOSIS — Z885 Allergy status to narcotic agent status: Secondary | ICD-10-CM | POA: Diagnosis not present

## 2015-04-06 DIAGNOSIS — K9423 Gastrostomy malfunction: Secondary | ICD-10-CM | POA: Diagnosis present

## 2015-04-06 DIAGNOSIS — J9620 Acute and chronic respiratory failure, unspecified whether with hypoxia or hypercapnia: Secondary | ICD-10-CM | POA: Diagnosis present

## 2015-04-06 DIAGNOSIS — Z85118 Personal history of other malignant neoplasm of bronchus and lung: Secondary | ICD-10-CM | POA: Diagnosis not present

## 2015-04-06 DIAGNOSIS — Z87442 Personal history of urinary calculi: Secondary | ICD-10-CM | POA: Diagnosis not present

## 2015-04-06 DIAGNOSIS — Z7982 Long term (current) use of aspirin: Secondary | ICD-10-CM | POA: Diagnosis not present

## 2015-04-06 DIAGNOSIS — J440 Chronic obstructive pulmonary disease with acute lower respiratory infection: Secondary | ICD-10-CM | POA: Diagnosis present

## 2015-04-06 DIAGNOSIS — M1990 Unspecified osteoarthritis, unspecified site: Secondary | ICD-10-CM | POA: Diagnosis present

## 2015-04-06 DIAGNOSIS — Z888 Allergy status to other drugs, medicaments and biological substances status: Secondary | ICD-10-CM | POA: Diagnosis not present

## 2015-04-06 DIAGNOSIS — Z9221 Personal history of antineoplastic chemotherapy: Secondary | ICD-10-CM | POA: Diagnosis not present

## 2015-04-06 DIAGNOSIS — Z87891 Personal history of nicotine dependence: Secondary | ICD-10-CM

## 2015-04-06 DIAGNOSIS — Z85819 Personal history of malignant neoplasm of unspecified site of lip, oral cavity, and pharynx: Secondary | ICD-10-CM | POA: Diagnosis not present

## 2015-04-06 DIAGNOSIS — K2270 Barrett's esophagus without dysplasia: Secondary | ICD-10-CM | POA: Diagnosis present

## 2015-04-06 DIAGNOSIS — J449 Chronic obstructive pulmonary disease, unspecified: Secondary | ICD-10-CM | POA: Diagnosis present

## 2015-04-06 DIAGNOSIS — I2510 Atherosclerotic heart disease of native coronary artery without angina pectoris: Secondary | ICD-10-CM | POA: Diagnosis present

## 2015-04-06 DIAGNOSIS — Z9889 Other specified postprocedural states: Secondary | ICD-10-CM

## 2015-04-06 DIAGNOSIS — G4730 Sleep apnea, unspecified: Secondary | ICD-10-CM | POA: Diagnosis present

## 2015-04-06 DIAGNOSIS — J189 Pneumonia, unspecified organism: Secondary | ICD-10-CM

## 2015-04-06 DIAGNOSIS — Z66 Do not resuscitate: Secondary | ICD-10-CM | POA: Diagnosis present

## 2015-04-06 DIAGNOSIS — F329 Major depressive disorder, single episode, unspecified: Secondary | ICD-10-CM | POA: Diagnosis present

## 2015-04-06 DIAGNOSIS — Z833 Family history of diabetes mellitus: Secondary | ICD-10-CM | POA: Diagnosis not present

## 2015-04-06 DIAGNOSIS — Z8701 Personal history of pneumonia (recurrent): Secondary | ICD-10-CM | POA: Diagnosis not present

## 2015-04-06 DIAGNOSIS — Z8546 Personal history of malignant neoplasm of prostate: Secondary | ICD-10-CM

## 2015-04-06 DIAGNOSIS — Z8673 Personal history of transient ischemic attack (TIA), and cerebral infarction without residual deficits: Secondary | ICD-10-CM

## 2015-04-06 DIAGNOSIS — Z8614 Personal history of Methicillin resistant Staphylococcus aureus infection: Secondary | ICD-10-CM

## 2015-04-06 DIAGNOSIS — N400 Enlarged prostate without lower urinary tract symptoms: Secondary | ICD-10-CM | POA: Diagnosis present

## 2015-04-06 DIAGNOSIS — Z79899 Other long term (current) drug therapy: Secondary | ICD-10-CM

## 2015-04-06 LAB — CBC WITH DIFFERENTIAL/PLATELET
BASOS ABS: 0 10*3/uL (ref 0–0.1)
EOS ABS: 0 10*3/uL (ref 0–0.7)
HCT: 29.8 % — ABNORMAL LOW (ref 40.0–52.0)
Hemoglobin: 8.8 g/dL — ABNORMAL LOW (ref 13.0–18.0)
LYMPHS ABS: 0.4 10*3/uL — AB (ref 1.0–3.6)
Lymphocytes Relative: 2 %
MCH: 20.2 pg — AB (ref 26.0–34.0)
MCHC: 29.5 g/dL — ABNORMAL LOW (ref 32.0–36.0)
MCV: 68.4 fL — ABNORMAL LOW (ref 80.0–100.0)
Monocytes Absolute: 2 10*3/uL — ABNORMAL HIGH (ref 0.2–1.0)
Monocytes Relative: 9 %
Neutro Abs: 19.4 10*3/uL — ABNORMAL HIGH (ref 1.4–6.5)
Neutrophils Relative %: 89 %
PLATELETS: 387 10*3/uL (ref 150–440)
RBC: 4.36 MIL/uL — AB (ref 4.40–5.90)
RDW: 18.3 % — ABNORMAL HIGH (ref 11.5–14.5)
WBC: 21.9 10*3/uL — AB (ref 3.8–10.6)

## 2015-04-06 LAB — COMPREHENSIVE METABOLIC PANEL
ALT: 35 U/L (ref 17–63)
ANION GAP: 7 (ref 5–15)
AST: 23 U/L (ref 15–41)
Albumin: 2.3 g/dL — ABNORMAL LOW (ref 3.5–5.0)
Alkaline Phosphatase: 122 U/L (ref 38–126)
BILIRUBIN TOTAL: 0.3 mg/dL (ref 0.3–1.2)
BUN: 36 mg/dL — ABNORMAL HIGH (ref 6–20)
CO2: 34 mmol/L — ABNORMAL HIGH (ref 22–32)
Calcium: 9.3 mg/dL (ref 8.9–10.3)
Chloride: 97 mmol/L — ABNORMAL LOW (ref 101–111)
Creatinine, Ser: 0.85 mg/dL (ref 0.61–1.24)
GFR calc Af Amer: >60 mL/min (ref >60)
Glucose, Bld: 185 mg/dL — ABNORMAL HIGH (ref 65–99)
POTASSIUM: 5 mmol/L (ref 3.5–5.1)
Sodium: 138 mmol/L (ref 135–145)
TOTAL PROTEIN: 6.9 g/dL (ref 6.5–8.1)

## 2015-04-06 LAB — URINALYSIS COMPLETE WITH MICROSCOPIC (ARMC ONLY)
BILIRUBIN URINE: NEGATIVE
GLUCOSE, UA: NEGATIVE mg/dL
HGB URINE DIPSTICK: NEGATIVE
Ketones, ur: NEGATIVE mg/dL
Nitrite: NEGATIVE
Protein, ur: NEGATIVE mg/dL
Specific Gravity, Urine: 1.016 (ref 1.005–1.030)
pH: 7 (ref 5.0–8.0)

## 2015-04-06 LAB — FIBRIN DERIVATIVES D-DIMER (ARMC ONLY): Fibrin derivatives D-dimer (ARMC): 504 — ABNORMAL HIGH (ref 0–499)

## 2015-04-06 LAB — LACTIC ACID, PLASMA: LACTIC ACID, VENOUS: 1.5 mmol/L (ref 0.5–2.0)

## 2015-04-06 LAB — BRAIN NATRIURETIC PEPTIDE: B NATRIURETIC PEPTIDE 5: 286 pg/mL — AB (ref 0.0–100.0)

## 2015-04-06 LAB — TROPONIN I: TROPONIN I: 0.05 ng/mL — AB (ref <0.031)

## 2015-04-06 MED ORDER — VANCOMYCIN HCL 10 G IV SOLR: 1.0000 g | Freq: Once | INTRAVENOUS | Status: DC

## 2015-04-06 MED ORDER — VANCOMYCIN HCL IN DEXTROSE 1-5 GM/200ML-% IV SOLN
1000.0000 mg | Freq: Once | INTRAVENOUS | Status: AC
  Administered 2015-04-07: 01:00:00 1000 mg via INTRAVENOUS

## 2015-04-06 MED ORDER — ENOXAPARIN SODIUM 40 MG/0.4ML ~~LOC~~ SOLN
40.0000 mg | SUBCUTANEOUS | Status: DC
  Administered 2015-04-06 – 2015-04-08 (×3): 40 mg via SUBCUTANEOUS
  Filled 2015-04-06 (×3): qty 0.4

## 2015-04-06 MED ORDER — IPRATROPIUM-ALBUTEROL 0.5-2.5 (3) MG/3ML IN SOLN: 3.0000 mL | Freq: Four times a day (QID) | RESPIRATORY_TRACT | Status: DC | PRN

## 2015-04-06 MED ORDER — PIPERACILLIN-TAZOBACTAM 3.375 G IVPB
3.3750 g | Freq: Three times a day (TID) | INTRAVENOUS | Status: DC
  Administered 2015-04-06 – 2015-04-09 (×8): 3.375 g via INTRAVENOUS
  Filled 2015-04-06 (×11): qty 50

## 2015-04-06 MED ORDER — FOLIC ACID 1 MG PO TABS
1.0000 mg | ORAL_TABLET | Freq: Every day | ORAL | Status: DC
  Administered 2015-04-07 – 2015-04-09 (×2): 1 mg
  Filled 2015-04-06 (×4): qty 1

## 2015-04-06 MED ORDER — FUROSEMIDE 20 MG PO TABS
20.0000 mg | ORAL_TABLET | Freq: Every day | ORAL | Status: DC
  Administered 2015-04-07 – 2015-04-09 (×2): 20 mg via ORAL
  Filled 2015-04-06 (×4): qty 1

## 2015-04-06 MED ORDER — CITALOPRAM HYDROBROMIDE 20 MG PO TABS
20.0000 mg | ORAL_TABLET | Freq: Every day | ORAL | Status: DC
  Administered 2015-04-07 – 2015-04-09 (×2): 20 mg
  Filled 2015-04-06 (×3): qty 1

## 2015-04-06 MED ORDER — LORAZEPAM 0.5 MG PO TABS
0.5000 mg | ORAL_TABLET | Freq: Four times a day (QID) | ORAL | Status: DC | PRN
  Administered 2015-04-06 – 2015-04-08 (×2): 0.5 mg
  Filled 2015-04-06 (×2): qty 1

## 2015-04-06 MED ORDER — MONTELUKAST SODIUM 10 MG PO TABS
10.0000 mg | ORAL_TABLET | Freq: Every day | ORAL | Status: DC
  Administered 2015-04-07 – 2015-04-08 (×2): 10 mg
  Filled 2015-04-06 (×3): qty 1

## 2015-04-06 MED ORDER — VANCOMYCIN HCL IN DEXTROSE 1-5 GM/200ML-% IV SOLN
1000.0000 mg | Freq: Two times a day (BID) | INTRAVENOUS | Status: DC
  Administered 2015-04-07 – 2015-04-08 (×3): 1000 mg via INTRAVENOUS
  Filled 2015-04-06 (×5): qty 200

## 2015-04-06 MED ORDER — LORATADINE 10 MG PO TABS
10.0000 mg | ORAL_TABLET | Freq: Every day | ORAL | Status: DC
  Administered 2015-04-07 – 2015-04-09 (×2): 10 mg
  Filled 2015-04-06 (×3): qty 1

## 2015-04-06 MED ORDER — PIPERACILLIN-TAZOBACTAM 3.375 G IVPB
3.3750 g | Freq: Once | INTRAVENOUS | Status: AC
  Administered 2015-04-06: 3.375 g via INTRAVENOUS
  Filled 2015-04-06: qty 50

## 2015-04-06 MED ORDER — IPRATROPIUM-ALBUTEROL 0.5-2.5 (3) MG/3ML IN SOLN
RESPIRATORY_TRACT | Status: AC
  Administered 2015-04-06: 3 mL via RESPIRATORY_TRACT
  Filled 2015-04-06: qty 3

## 2015-04-06 MED ORDER — ZOLPIDEM TARTRATE 5 MG PO TABS
5.0000 mg | ORAL_TABLET | Freq: Every evening | ORAL | Status: DC | PRN
  Administered 2015-04-06: 22:00:00 5 mg
  Filled 2015-04-06: qty 1

## 2015-04-06 MED ORDER — CENTRATEX 106-1 MG PO CAPS: 1.0000 | ORAL_CAPSULE | Freq: Every day | ORAL | Status: DC

## 2015-04-06 MED ORDER — HYDROCODONE-ACETAMINOPHEN 5-325 MG PO TABS: 1.0000 | ORAL_TABLET | Freq: Three times a day (TID) | ORAL | Status: DC | PRN

## 2015-04-06 MED ORDER — ADULT MULTIVITAMIN W/MINERALS CH
1.0000 | ORAL_TABLET | Freq: Every day | ORAL | Status: DC
  Administered 2015-04-07 – 2015-04-09 (×2): 1
  Filled 2015-04-06 (×4): qty 1

## 2015-04-06 MED ORDER — IPRATROPIUM-ALBUTEROL 0.5-2.5 (3) MG/3ML IN SOLN
3.0000 mL | Freq: Once | RESPIRATORY_TRACT | Status: AC
  Administered 2015-04-06: 3 mL via RESPIRATORY_TRACT

## 2015-04-06 NOTE — ED Provider Notes (Signed)
[facility] Emergency Department Provider Note  ____________________________________________  Time seen: Approximately 5:26 PM  I have reviewed the triage vital signs and the nursing notes.   HISTORY  Chief Complaint Shortness of Breath    HPI Stephen Taylor is a 81 y.o. male patient reports increasing shortness of breath for about 3 weeks. It gradually increased it does not really seem to been sudden onset. Patient has a chronic cough does not really seem to be any worsening usual. Patient has a trach and feeding tube she hasn't known the diagnosis of cancer as noted below. Patient reports this shortness of breath with exertion as well. Again gradually worsening over several weeks. Patient is not aware of any fever productivity of the cough has worsened somewhat but not very much.   Past Medical History  Diagnosis Date  . Cancer ([place])   . COPD (chronic obstructive pulmonary disease) ([place])   . Coronary artery disease   . Stroke ([place])   . Depression   . Sleep apnea   . MRSA pneumonia ([place])   . Barrett's esophagus   . BPH (benign prostatic hyperplasia)   . Nephrolithiasis   . Osteoarthritis     Patient Active Problem List   Diagnosis Date Noted  . Pneumonia 04/06/2015    Past Surgical History  Procedure Laterality Date  . Tracheostomy N/A   . Gastrostomy w/ feeding tube N/A   . Laminectomy    . Rotator cuff repair    . Lung removal, partial    . Esophageogastrectomy    . Peg tube placement    . Renal calculi    . Ureteral stent placement    . Flexible bronchoscopy N/A 12/24/2014    Procedure: FLEXIBLE BRONCHOSCOPY;  Surgeon: Allyne Gee, MD;  Location: [facility] ORS;  Service: Pulmonary;  Laterality: N/A;    No current outpatient prescriptions on file.  Allergies Morphine and related  Family History  Problem Relation Age of Onset  . Diabetes Brother     Social History Social History  Substance Use Topics  . Smoking status: Former  Smoker -- 2.00 packs/day for 30 years    Types: Cigarettes    Quit date: 12/23/1984  . Smokeless tobacco: None  . Alcohol Use: No    Review of Systems Constitutional: No fever/chills Eyes: No visual changes. ENT: No sore throat. Cardiovascular: Denies chest pain. Respiratory: See history of present illness. Gastrointestinal: No abdominal pain.  No nausea, no vomiting.  No diarrhea.  No constipation. Genitourinary: Negative for dysuria. Musculoskeletal: Negative for back pain. Skin: Negative for rash. Neurological: Negative for headaches, focal weakness or numbness.  10-point ROS otherwise negative.  ____________________________________________   PHYSICAL EXAM:  VITAL SIGNS: ED Triage Vitals  Enc Vitals Group     BP 04/06/15 1535 130/68 mmHg     Pulse Rate 04/06/15 1535 75     Resp --      Temp 04/06/15 1535 97.9 F (36.6 C)     Temp Source 04/06/15 1535 Oral     SpO2 04/06/15 1535 97 %     Weight 04/06/15 1535 180 lb (81.647 kg)     Height 04/06/15 1535 5\' 11"  (1.803 m)     Head Cir --      Peak Flow --      Pain Score --      Pain Loc --      Pain Edu? --      Excl. in [place]? --  Constitutional: Alert and oriented. Chronically ill and in no acute distress. Eyes: Conjunctivae are normal. PERRL. EOMI. Head: Atraumatic. Nose: No congestion/rhinnorhea. Mouth/Throat: Mucous membranes are moist.  Oropharynx non-erythematous. Neck: No stridor. Patient does have a trach in place he can speak if he occludes the trach    Cardiovascular: Normal rate, regular rhythm. Grossly normal heart sounds.  Good peripheral circulation. Respiratory: Normal respiratory effort.  No retractions.Diffuse wheezes these improve with nebulization crackles or left  Gastrointestinal: Soft and nontender. No distention. No abdominal bruits. No CVA tenderness.G-tube in place Musculoskeletal: No lower extremity tenderness nor edema.  No joint effusions. Neurologic:  Normal speech and language. No  gross focal neurologic deficits are appreciated. No gait instability. Skin:  Skin is warm, dry and intact. No rash noted.   ____________________________________________   LABS (all labs ordered are listed, but only abnormal results are displayed)  Labs Reviewed  COMPREHENSIVE METABOLIC PANEL - Abnormal; Notable for the following:    Chloride 97 (*)    CO2 34 (*)    Glucose, Bld 185 (*)    BUN 36 (*)    Albumin 2.3 (*)    All other components within normal limits  BRAIN NATRIURETIC PEPTIDE - Abnormal; Notable for the following:    B Natriuretic Peptide 286.0 (*)    All other components within normal limits  TROPONIN I - Abnormal; Notable for the following:    Troponin I 0.05 (*)    All other components within normal limits  CBC WITH DIFFERENTIAL/PLATELET - Abnormal; Notable for the following:    WBC 21.9 (*)    RBC 4.36 (*)    Hemoglobin 8.8 (*)    HCT 29.8 (*)    MCV 68.4 (*)    MCH 20.2 (*)    MCHC 29.5 (*)    RDW 18.3 (*)    Neutro Abs 19.4 (*)    Lymphs Abs 0.4 (*)    Monocytes Absolute 2.0 (*)    All other components within normal limits  URINALYSIS COMPLETEWITH MICROSCOPIC ([facility] ONLY) - Abnormal; Notable for the following:    Color, Urine YELLOW (*)    APPearance CLOUDY (*)    Leukocytes, UA 2+ (*)    Bacteria, UA MANY (*)    Squamous Epithelial / LPF 0-5 (*)    All other components within normal limits  FIBRIN DERIVATIVES D-DIMER ([facility] ONLY) - Abnormal; Notable for the following:    Fibrin derivatives D-dimer ([facility]) 504 (*)    All other components within normal limits  CULTURE, BLOOD (ROUTINE X 2)  CULTURE, BLOOD (ROUTINE X 2)  CULTURE, EXPECTORATED SPUTUM-ASSESSMENT  LACTIC ACID, PLASMA  LACTIC ACID, PLASMA  BASIC METABOLIC PANEL  CBC  CBC  CREATININE, SERUM   ____________________________________________  EKG  EKG read and interpreted by me shows normal sinus rhythm rate of 62 normal axis no acute changes   ____________________________________________  RADIOLOGY  Chest x-rays read by radiologist as bilateral patchy infiltrates  ____________________________________________   PROCEDURES    ____________________________________________   INITIAL IMPRESSION / ASSESSMENT AND PLAN / ED COURSE  Pertinent labs & imaging results that were available during my care of the patient were reviewed by me and considered in my medical decision making (see chart for details).   ____________________________________________   FINAL CLINICAL IMPRESSION(S) / ED DIAGNOSES  Final diagnoses:  Healthcare-associated pneumonia      Nena Polio, MD 04/06/15 2100

## 2015-04-06 NOTE — ED Notes (Signed)
MD at bedside. 

## 2015-04-06 NOTE — ED Notes (Signed)
Pt ems from home for shortness of breath that started 3 weeks ago. Pt with trach and feeding tube.

## 2015-04-06 NOTE — Progress Notes (Signed)
ANTIBIOTIC CONSULT NOTE - INITIAL  Pharmacy Consult for Vancomycin/Zosyn Indication: pneumonia  Allergies  Allergen Reactions  . Morphine And Related Other (See Comments)    Reaction:  Altered mental status    Patient Measurements: Height: 5\' 11"  (180.3 cm) Weight: 180 lb (81.647 kg) IBW/kg (Calculated) : 75.3  Vital Signs: Temp: 97.9 F (36.6 C) (10/10 1535) Temp Source: Oral (10/10 1535) BP: 118/45 mmHg (10/10 1736) Pulse Rate: 81 (10/10 1736) Intake/Output from previous day:   Intake/Output from this shift:    Labs:  Recent Labs  04/06/15 1558  WBC 21.9*  HGB 8.8*  PLT 387  CREATININE 0.85   Estimated Creatinine Clearance: 72.6 mL/min (by C-G formula based on Cr of 0.85). No results for input(s): VANCOTROUGH, VANCOPEAK, VANCORANDOM, GENTTROUGH, GENTPEAK, GENTRANDOM, TOBRATROUGH, TOBRAPEAK, TOBRARND, AMIKACINPEAK, AMIKACINTROU, AMIKACIN in the last 72 hours.   Microbiology: No results found for this or any previous visit (from the past 720 hour(s)).  Medical History: Past Medical History  Diagnosis Date  . Cancer ([place])   . COPD (chronic obstructive pulmonary disease) ([place])   . Coronary artery disease   . Stroke ([place])   . Depression   . Sleep apnea   . MRSA pneumonia ([place])   . Barrett's esophagus   . BPH (benign prostatic hyperplasia)   . Nephrolithiasis   . Osteoarthritis     Medications:  Anti-infectives    Start     Dose/Rate Route Frequency Ordered Stop   04/06/15 1745  vancomycin (VANCOCIN) injection 1 g  Status:  Discontinued     1 g Intravenous  Once 04/06/15 1731 04/06/15 1736   04/06/15 1745  vancomycin (VANCOCIN) IVPB 1000 mg/200 mL premix     1,000 mg 200 mL/hr over 60 Minutes Intravenous  Once 04/06/15 1736     04/06/15 1730  piperacillin-tazobactam (ZOSYN) IVPB 3.375 g     3.375 g 12.5 mL/hr over 240 Minutes Intravenous  Once 04/06/15 1726       Assessment: 81 yo male admitted for pneumonia.  Pharmacy consulted to dose vancomycin  and zosyn.  PK Parameters: Dosing Weight: 81.6 kg Ke: 0.064 T1/2: 10.8 hrs Vd: 57.12 L  Goal of Therapy:  Vancomycin trough level 15-20 mcg/ml  Plan:  Received one dose of vancomycin and zosyn in the ED. Will start vancomycin 1000mg  IV Q12H ~6 hours after first dose for stacked dosing. Trough prior to 4th dose. Will start zosyn 3.375g IV Q8H EI infusion. Follow up culture results  Norma Fredrickson, PharmD Clinical Pharmacist 04/06/2015,6:11 PM

## 2015-04-06 NOTE — Plan of Care (Signed)
Problem: Discharge Progression Outcomes Goal: Barriers To Progression Addressed/Resolved Outcome: Progressing Individualization: Lives at home with wife. Pt reports he assists with tube feeding with no po intake. Reports wife prepares all of his medication. Uses nutren 2.0 calorie per pump to infuse all night which wife reports uses 4 bottles with total of 2200 cal via gtube at 95 ml/hr . High risk for fall-bed alarm, protective footwear, keep personal items at hands reach,offer assistance with toileting with hourly rounds, keep urinal at bedside, encourage pt to call for assistance. PMH- lung/throat cancer with trach,  COPD, CAD, Stroke, anxiety, OA and prostate cancer-controlled by home meds and tube feedings.

## 2015-04-06 NOTE — H&P (Signed)
[facility] NAME: Stephen Taylor    MR#:  [id number]  DATE OF BIRTH:  1931/08/25  DATE OF ADMISSION:  04/06/2015  PRIMARY CARE PHYSICIAN: Lavera Guise, MD   REQUESTING/REFERRING PHYSICIAN: Malinda  CHIEF COMPLAINT:   Chief Complaint  Patient presents with  . Shortness of Breath    HISTORY OF PRESENT ILLNESS: Stephen Taylor  is a 81 y.o. male with a known history of lung and throat cancer status post tracheostomy, coronary artery disease, stroke, depression, sleep apnea, MRSA pneumonia in past, osteoarthritis, recently diagnosed prostate cancer- lives at home with wife and able to walk without any support. For last 1 week he has been getting more and more weak, and he is getting short of breath on minimal exertion like just walking up to the bathroom. Concerned with this he called his primary pulmonologist Dr. Daun Peacock on phone and he gave prescription for antibiotic and oral steroid but it did not help much he also was using DuoNeb nebulizer. Finally decided to come to emergency room today and was found to have pneumonia on chest x-ray so given for admission to hospitalist team. He also agrees that he had a thick secretions from his trach tube and not able to clean any more because of its thickness.  PAST MEDICAL HISTORY:   Past Medical History  Diagnosis Date  . Cancer ([place])   . COPD (chronic obstructive pulmonary disease) ([place])   . Coronary artery disease   . Stroke ([place])   . Depression   . Sleep apnea   . MRSA pneumonia ([place])   . Barrett's esophagus   . BPH (benign prostatic hyperplasia)   . Nephrolithiasis   . Osteoarthritis     PAST SURGICAL HISTORY:  Past Surgical History  Procedure Laterality Date  . Tracheostomy N/A   . Gastrostomy w/ feeding tube N/A   . Laminectomy    . Rotator cuff repair    . Lung removal, partial    . Esophageogastrectomy    . Peg tube placement    . Renal calculi    . Ureteral  stent placement    . Flexible bronchoscopy N/A 12/24/2014    Procedure: FLEXIBLE BRONCHOSCOPY;  Surgeon: Allyne Gee, MD;  Location: [facility] ORS;  Service: Pulmonary;  Laterality: N/A;    SOCIAL HISTORY:  Social History  Substance Use Topics  . Smoking status: Former Smoker -- 2.00 packs/day for 30 years    Types: Cigarettes    Quit date: 12/23/1984  . Smokeless tobacco: Not on file  . Alcohol Use: No    FAMILY HISTORY:  Family History  Problem Relation Age of Onset  . Diabetes Brother     DRUG ALLERGIES:  Allergies  Allergen Reactions  . Morphine And Related Other (See Comments)    Reaction:  Altered mental status    REVIEW OF SYSTEMS:   CONSTITUTIONAL: No fever,positive fatigue or weakness.  EYES: No blurred or double vision.  EARS, NOSE, AND THROAT: No tinnitus or ear pain.  RESPIRATORY: No cough, positive for shortness of breath, wheezing or hemoptysis.  CARDIOVASCULAR: No chest pain, orthopnea, edema.  GASTROINTESTINAL: No nausea, vomiting, diarrhea or abdominal pain.  GENITOURINARY: No dysuria, hematuria.  ENDOCRINE: No polyuria, nocturia,  HEMATOLOGY: No anemia, easy bruising or bleeding SKIN: No rash or lesion. MUSCULOSKELETAL: No joint pain or arthritis.   NEUROLOGIC: No tingling, numbness, weakness.  PSYCHIATRY: No anxiety or depression.   MEDICATIONS AT HOME:  Prior  to Admission medications   Medication Sig Start Date End Date Taking? Authorizing Provider  aspirin EC 81 MG tablet Take 81 mg by mouth daily.   Yes Historical Provider, MD  citalopram (CELEXA) 20 MG tablet Take 20 mg by mouth daily.   Yes Historical Provider, MD  EPINEPHrine (EPIPEN 2-PAK) 0.3 mg/0.3 mL IJ SOAJ injection Inject 0.3 mg into the muscle once as needed (for severe allergic reaction).   Yes Historical Provider, MD  Fe Fum-FA-B Cmp-C-Zn-Mg-Mn-Cu (CENTRATEX) 106-1 MG CAPS Take 1 capsule by mouth daily.   Yes Historical Provider, MD  ferrous sulfate 325 (65 FE) MG EC tablet Take 325 mg  by mouth daily.   Yes Historical Provider, MD  folic acid (FOLVITE) 1 MG tablet Take 1 mg by mouth daily.   Yes Historical Provider, MD  HYDROcodone-acetaminophen (NORCO/VICODIN) 5-325 MG per tablet Take 1 tablet by mouth 3 (three) times daily as needed for moderate pain.    Yes Historical Provider, MD  ipratropium-albuterol (DUONEB) 0.5-2.5 (3) MG/3ML SOLN Take 3 mLs by nebulization every 6 (six) hours as needed (for shortness of breath).   Yes Historical Provider, MD  loratadine (CLARITIN) 10 MG tablet Take 10 mg by mouth daily.   Yes Historical Provider, MD  LORazepam (ATIVAN) 0.5 MG tablet Take 0.5 mg by mouth 4 (four) times daily as needed for anxiety.    Yes Historical Provider, MD  montelukast (SINGULAIR) 10 MG tablet Take 10 mg by mouth at bedtime.    Yes Historical Provider, MD  Multiple Vitamin (MULTIVITAMIN WITH MINERALS) TABS tablet Take 1 tablet by mouth daily.   Yes Historical Provider, MD  pantoprazole (PROTONIX) 40 MG tablet Take 40 mg by mouth 2 (two) times daily.   Yes Historical Provider, MD  predniSONE (DELTASONE) 10 MG tablet Take 10 mg by mouth 3 (three) times daily.   Yes Historical Provider, MD  pseudoephedrine-guaifenesin (MUCINEX D) 60-600 MG per tablet Take 1 tablet by mouth 2 (two) times daily as needed for congestion.    Yes Historical Provider, MD  tamsulosin (FLOMAX) 0.4 MG CAPS capsule Take 0.4 mg by mouth daily.    Yes Historical Provider, MD  zolpidem (AMBIEN) 5 MG tablet Take 5 mg by mouth at bedtime as needed for sleep.   Yes Historical Provider, MD  furosemide (LASIX) 20 MG tablet Take 1 tablet (20 mg total) by mouth daily. Patient not taking: Reported on 04/06/2015 11/06/14 11/06/15  Lisa Roca, MD  predniSONE (DELTASONE) 10 MG tablet Take 5 tablets (50 mg total) by mouth daily. Patient not taking: Reported on 04/06/2015 11/06/14   Lisa Roca, MD      PHYSICAL EXAMINATION:   VITAL SIGNS: Blood pressure 118/45, pulse 81, temperature 97.9 F (36.6 C),  temperature source Oral, resp. rate 20, height 5\' 11"  (1.803 m), weight 81.647 kg (180 lb), SpO2 93 %.  GENERAL:  81 y.o.-year-old patient lying in the bed with no acute distress.  EYES: Pupils equal, round, reactive to light and accommodation. No scleral icterus. Extraocular muscles intact.  HEENT: Head atraumatic, normocephalic. Oropharynx and nasopharynx clear.  NECK:  Supple, no jugular venous distention. No thyroid enlargement, no tenderness.  LUNGS: Normal breath sounds bilaterally, no wheezing, mild crepitation. No use of accessory muscles of respiration.  CARDIOVASCULAR: S1, S2 normal. Positive for systolic murmurs, rubs, or gallops. Tracheostomy present. ABDOMEN: Soft, nontender, nondistended. Bowel sounds present. No organomegaly or mass.  EXTREMITIES: No pedal edema, cyanosis, or clubbing.  NEUROLOGIC: Cranial nerves II through XII are intact. Muscle  strength 5/5 in all extremities. Sensation intact. Gait not checked.  PSYCHIATRIC: The patient is alert and oriented x 3.  SKIN: No obvious rash, lesion, or ulcer.   LABORATORY PANEL:   CBC  Recent Labs Lab 04/06/15 1558  WBC 21.9*  HGB 8.8*  HCT 29.8*  PLT 387  MCV 68.4*  MCH 20.2*  MCHC 29.5*  RDW 18.3*  LYMPHSABS 0.4*  MONOABS 2.0*  EOSABS 0.0  BASOSABS 0.0   ------------------------------------------------------------------------------------------------------------------  Chemistries   Recent Labs Lab 04/06/15 1558  NA 138  K 5.0  CL 97*  CO2 34*  GLUCOSE 185*  BUN 36*  CREATININE 0.85  CALCIUM 9.3  AST 23  ALT 35  ALKPHOS 122  BILITOT 0.3   ------------------------------------------------------------------------------------------------------------------ estimated creatinine clearance is 72.6 mL/min (by C-G formula based on Cr of 0.85). ------------------------------------------------------------------------------------------------------------------ No results for input(s): TSH, T4TOTAL, T3FREE,  THYROIDAB in the last 72 hours.  Invalid input(s): FREET3   Coagulation profile No results for input(s): INR, PROTIME in the last 168 hours. ------------------------------------------------------------------------------------------------------------------- No results for input(s): DDIMER in the last 72 hours. -------------------------------------------------------------------------------------------------------------------  Cardiac Enzymes  Recent Labs Lab 04/06/15 1558  TROPONINI 0.05*   ------------------------------------------------------------------------------------------------------------------ Invalid input(s): POCBNP  ---------------------------------------------------------------------------------------------------------------  Urinalysis    Component Value Date/Time   COLORURINE Yellow 06/16/2014 1522   APPEARANCEUR Cloudy 06/16/2014 1522   LABSPEC 1.016 06/16/2014 1522   PHURINE 6.0 06/16/2014 1522   GLUCOSEU Negative 06/16/2014 1522   HGBUR 2+ 06/16/2014 1522   BILIRUBINUR Negative 06/16/2014 1522   KETONESUR Negative 06/16/2014 1522   PROTEINUR 100 mg/dL 06/16/2014 1522   NITRITE Negative 06/16/2014 1522   LEUKOCYTESUR 3+ 06/16/2014 1522     RADIOLOGY: Dg Chest 2 View  04/06/2015   CLINICAL DATA:  Shortness of breath  EXAM: CHEST  2 VIEW  COMPARISON:  11/13/2014 chest radiograph  FINDINGS: Stable cardiomediastinal silhouette with normal heart size and atherosclerotic aortic arch. No pneumothorax. No pleural effusion. Re- demonstrated are large bilateral lower lobe lung masses, which have increased in size since 11/13/2014, for example a 7.8 cm left lower lobe lung mass, previously 6.0 cm. No overt pulmonary edema. Mild patchy bibasilar lung opacities.  IMPRESSION: 1. Enlarging bilateral lower lobe lung masses, in keeping with progressive pulmonary metastases versus synchronous primary lung malignancy. 2. New mild patchy bibasilar lung opacities, which are  nonspecific, cannot exclude aspiration and/or pneumonia.   Electronically Signed   By: Ilona Sorrel M.D.   On: 04/06/2015 16:53    IMPRESSION AND PLAN:  * Pneumonia  We'll give IV vancomycin and Zosyn for now as he has history of cancer undergoing chemotherapy and he had MRSA pneumonia in the past.  Get the secretion sample from his tracheostomy and sent it for culture after deep suction which might help to guide the therapy further.  Blood cultures are sent by ER patient is not septic currently.  * COPD  Currently no wheezing, continue DuoNeb as needed.  * Throat cancer,   Status post trach and PEG tube placement, triple vitamin supplements continue that. Addition to have continuing his tube feeding.   * Generalized weakness   physical therapy evaluation.  All the records are reviewed and case discussed with ED provider. Management plans discussed with the patient, family and they are in agreement.  CODE STATUS:DO NOT RESUSCITATE  Patient confirmed that in presence of wife and grandchildren.   TOTAL TIME TAKING CARE OF THIS PATIENT:50 min.    Vaughan Basta M.D on 04/06/2015   Between 7am to  6pm - Pager - [phone]  After 6pm go to [email or web address] - password [email or web address] [facility]  Office  [phone]  CC: Primary care physician; Lavera Guise, MD   Note: This dictation was prepared with Dragon dictation along with smaller phrase technology. Any transcriptional errors that result from this process are unintentional.

## 2015-04-06 NOTE — Progress Notes (Signed)
Pt has been placed on .35 ATC, SPO2 99%. Pt has a 6 Moore trach. We do not carry Laurance Flatten trachs in this facility, a #6 Shiley has been placed at bedside along with an AMBU bag.

## 2015-04-07 LAB — CBC
HCT: 28.8 % — ABNORMAL LOW (ref 40.0–52.0)
Hemoglobin: 8.6 g/dL — ABNORMAL LOW (ref 13.0–18.0)
MCH: 20.5 pg — ABNORMAL LOW (ref 26.0–34.0)
MCHC: 29.9 g/dL — AB (ref 32.0–36.0)
MCV: 68.6 fL — ABNORMAL LOW (ref 80.0–100.0)
PLATELETS: 372 10*3/uL (ref 150–440)
RBC: 4.2 MIL/uL — ABNORMAL LOW (ref 4.40–5.90)
RDW: 18.2 % — ABNORMAL HIGH (ref 11.5–14.5)
WBC: 17.2 10*3/uL — ABNORMAL HIGH (ref 3.8–10.6)

## 2015-04-07 LAB — BASIC METABOLIC PANEL
Anion gap: 8 (ref 5–15)
BUN: 32 mg/dL — AB (ref 6–20)
CALCIUM: 9.1 mg/dL (ref 8.9–10.3)
CO2: 33 mmol/L — ABNORMAL HIGH (ref 22–32)
Chloride: 97 mmol/L — ABNORMAL LOW (ref 101–111)
Creatinine, Ser: 0.9 mg/dL (ref 0.61–1.24)
GFR calc Af Amer: >60 mL/min (ref >60)
GLUCOSE: 148 mg/dL — AB (ref 65–99)
Potassium: 4.5 mmol/L (ref 3.5–5.1)
Sodium: 138 mmol/L (ref 135–145)

## 2015-04-07 LAB — EXPECTORATED SPUTUM ASSESSMENT W GRAM STAIN, RFLX TO RESP C

## 2015-04-07 LAB — EXPECTORATED SPUTUM ASSESSMENT W REFEX TO RESP CULTURE

## 2015-04-07 LAB — LACTIC ACID, PLASMA: LACTIC ACID, VENOUS: 1.4 mmol/L (ref 0.5–2.0)

## 2015-04-07 MED ORDER — SODIUM CHLORIDE 0.9 % IJ SOLN
3.0000 mL | Freq: Two times a day (BID) | INTRAMUSCULAR | Status: DC
  Administered 2015-04-07 – 2015-04-08 (×2): 3 mL via INTRAVENOUS

## 2015-04-07 MED ORDER — TAMSULOSIN HCL 0.4 MG PO CAPS
0.4000 mg | ORAL_CAPSULE | Freq: Every day | ORAL | Status: DC
  Administered 2015-04-07 – 2015-04-09 (×2): 0.4 mg via ORAL
  Filled 2015-04-07 (×3): qty 1

## 2015-04-07 MED ORDER — FREE WATER
200.0000 mL | Freq: Four times a day (QID) | Status: DC
  Administered 2015-04-07 – 2015-04-09 (×5): 200 mL

## 2015-04-07 MED ORDER — SODIUM CHLORIDE 0.9 % IJ SOLN: 3.0000 mL | INTRAMUSCULAR | Status: DC | PRN

## 2015-04-07 MED ORDER — JEVITY 1.5 CAL/FIBER PO LIQD
1000.0000 mL | ORAL | Status: DC
  Administered 2015-04-07 – 2015-04-08 (×2): 1000 mL

## 2015-04-07 MED ORDER — FERROUS SULFATE 325 (65 FE) MG PO TABS
325.0000 mg | ORAL_TABLET | Freq: Every day | ORAL | Status: DC
  Administered 2015-04-07 – 2015-04-09 (×2): 325 mg via ORAL
  Filled 2015-04-07 (×3): qty 1

## 2015-04-07 NOTE — Care Management (Signed)
Admitted to [facility] with the diagnosis of pneumonia. Lives with wife, Levander Campion, [phone]). Last seen Dr. Humphrey Rolls 2 weeks ago. G-Tube that is maintained in the home. Trach in place. No home health. No skilled facility. History of lung throat cancer. History of MRSA. Uses a cane to aid in ambulation. Self dress and bath. No life Alert. No falls. Wife helps him in the home. Wife will transport. Shelbie Ammons RN MSN Care management [phone]

## 2015-04-07 NOTE — Care Management Important Message (Signed)
Important Message  Patient Details  Name: Stephen Taylor MRN: [id number] Date of Birth: Sep 19, 1931   Medicare Important Message Given:  Yes-second notification given    Shelbie Ammons, RN 04/07/2015, 8:45 AM

## 2015-04-07 NOTE — Plan of Care (Signed)
Problem: Discharge Progression Outcomes Goal: Activity appropriate for discharge plan Outcome: Progressing Patient doing well today, VSS, continues to get IV antibiotics, seen diatician today and will start back on continuous feeds tonight through peg no significant issues today, pain under control

## 2015-04-07 NOTE — Evaluation (Signed)
Physical Therapy Evaluation Patient Details Name: Stephen Taylor MRN: [id number] DOB: [birth year] Today's Date: 04/07/2015   History of Present Illness  Pt is a 81 yo male who was admitted to the hospital with a diagnosis of pneumonia   Clinical Impression  Pt presents with hx of cancer, COPD, CAD, stroke, and pneumonia. Prior to evaluation, nursing staff cleared pt to work with PT in regards to the slightly elevated troponin from yesterday. Pt with no chest pain or complaints during session. It was revealed that pt performs bed mobility at mod I, transfers at [facility], and ambulation of 80 ft at [facility]. Pt shows fairly good strength with AROM and functional mobility, however he does have minor unsteadiness and buckling with ambulation. He also becomes fatigued quicker than he did prior to hospitalization. Pt ambulated off O2 during ambulation and his O2 sats before, during, or after ambulation never fell below 94%. Due to his gait deficits and decreased activity tolerance, he will continue to benefit from skilled PT in order to return to his baseline level of function.     Follow Up Recommendations Home health PT;Supervision - Intermittent    Equipment Recommendations    None recommended by PT   Recommendations for Other Services       Precautions / Restrictions Precautions Precautions: Fall Restrictions Weight Bearing Restrictions: No      Mobility  Bed Mobility Overal bed mobility: Modified Independent             General bed mobility comments: Pt uses side rails to assist him with rolling and getting to EOB   Transfers Overall transfer level: Needs assistance Equipment used: Rolling walker (2 wheeled) Transfers: Sit to/from Stand Sit to Stand: Min guard         General transfer comment: Pt transfers with good functional strength and use of hands off of bed to get into standing. No LOB noted  Ambulation/Gait Ambulation/Gait assistance: Min guard Ambulation Distance  (Feet): 80 feet Assistive device:  (No AD and RW) Gait Pattern/deviations: Step-through pattern;Decreased step length - right;Decreased step length - left;Narrow base of support Gait velocity: decreased Gait velocity interpretation: Below normal speed for age/gender General Gait Details: Pt ambulates without RW with unsteady gait and needing to hold onto things within the room. Once provided the RW his gait smoothened out and he was no longer unstable. He stated feeling better ambulating with the RW.   Stairs            Wheelchair Mobility    Modified Rankin (Stroke Patients Only)       Balance Overall balance assessment: No apparent balance deficits (not formally assessed)                                           Pertinent Vitals/Pain Pain Assessment: No/denies pain    Home Living Family/patient expects to be discharged to:: Private residence Living Arrangements: Spouse/significant other Available Help at Discharge: Family;Available 24 hours/day Type of Home: House Home Access: Level entry     Home Layout: One level Home Equipment: Walker - 2 wheels      Prior Function Level of Independence: Independent         Comments: Pt was able to ambulate community distances and was indep with ADLs     Hand Dominance        Extremity/Trunk Assessment   Upper Extremity Assessment:  Overall WFL for tasks assessed           Lower Extremity Assessment: Overall WFL for tasks assessed      Cervical / Trunk Assessment: Normal  Communication   Communication: No difficulties  Cognition Arousal/Alertness: Awake/alert Behavior During Therapy: WFL for tasks assessed/performed Overall Cognitive Status: Within Functional Limits for tasks assessed                      General Comments      Exercises Other Exercises Other Exercises: Pt performed bilateral therex x 12 reps at supervision for proper technique. Exercises included: ankle  pumps, LAQ, SLR, hip abd, and press ups      Assessment/Plan    PT Assessment Patient needs continued PT services  PT Diagnosis Difficulty walking;Abnormality of gait   PT Problem List Decreased activity tolerance;Cardiopulmonary status limiting activity  PT Treatment Interventions DME instruction;Gait training;Stair training;Functional mobility training;Therapeutic activities;Therapeutic exercise;Balance training;Neuromuscular re-education   PT Goals (Current goals can be found in the Care Plan section) Acute Rehab PT Goals Patient Stated Goal: to return home PT Goal Formulation: With patient Time For Goal Achievement: 04/21/15 Potential to Achieve Goals: Good    Frequency Min 2X/week   Barriers to discharge        Co-evaluation               End of Session Equipment Utilized During Treatment: Gait belt Activity Tolerance: Patient tolerated treatment well Patient left: in bed;with call bell/phone within reach;with bed alarm set;with family/visitor present (Upright in bed per pt wish b/c of visit from wife) Nurse Communication: Mobility status         Time: 7078-6754 PT Time Calculation (min) (ACUTE ONLY): 23 min   Charges:         PT G CodesJanyth Contes 04-23-15, 12:51 PM  Janyth Contes, SPT. [phone]

## 2015-04-07 NOTE — Progress Notes (Signed)
Initial Nutrition Assessment   INTERVENTION:   EN: Recommend Jevity 1.5 at 92mL/hr for a 14 hour nighttime feed at this time to provide a total of 1995kcals and 85g protein with 1046mL of free water. Will recommend additional free water of 21mL QID for an additional 8102mL of free water. Will reassess tolerance and adjust accordingly as Stephen Taylor usually on higher caloric EN PTA for a 10 hour feed. In house we do not carry Nutren 2.0 therefore will substitute Jevity 1.5 at this time.   NUTRITION DIAGNOSIS:   Swallowing difficulty related to cancer and cancer related treatments as evidenced by  (Stephen Taylor with h/o throat cancer, with Trach/PEG, receiving EN).  GOAL:   Patient will meet greater than or equal to 90% of their needs  MONITOR:    (Energy Intake, Anthropometrics, Digestive system, Pulmonary Profile, Electrolyte and renal Profile)  REASON FOR ASSESSMENT:   Consult Enteral/tube feeding initiation and management  ASSESSMENT:   Stephen Taylor admitted with pna. Stephen Taylor with h/o lung, throat cancer with trach/PEG undergoing chemotherapy. Per MD note Stephen Taylor also with prostate cancer.  Past Medical History  Diagnosis Date  . Cancer ([place])   . COPD (chronic obstructive pulmonary disease) ([place])   . Coronary artery disease   . Stroke ([place])   . Depression   . Sleep apnea   . MRSA pneumonia ([place])   . Barrett's esophagus   . BPH (benign prostatic hyperplasia)   . Nephrolithiasis   . Osteoarthritis    Past Surgical History  Procedure Laterality Date  . Tracheostomy N/A   . Gastrostomy w/ feeding tube N/A   . Laminectomy    . Rotator cuff repair    . Lung removal, partial    . Esophageogastrectomy    . Peg tube placement    . Renal calculi    . Ureteral stent placement    . Flexible bronchoscopy N/A 12/24/2014    Procedure: FLEXIBLE BRONCHOSCOPY;  Surgeon: Allyne Gee, MD;  Location: [facility] ORS;  Service: Pulmonary;  Laterality: N/A;    Diet Order:       Current Nutrition: Stephen Taylor received Nutren 2.0  brought in from home at rate of 54mL/hr. RN Mendel Ryder stopped feeding this am around 11:00am, tolerating well.   Food/Nutrition-Related History: Per Stephen Taylor, he was receiving Nutren 2.0 (4 cartons per night) at a rate of 95mL/hr for 10 hour feed. Per Stephen Taylor wife sometimes Stephen Taylor would increase feed to 178mL/hr to complete the feeding faster. Stephen Taylor wife reports flushing with free water before and after TF infusion and an additional 20oz during the day. Nutren 2.0 at 65mL/hr using 4 cartons would equate to 2000kcals, 84g protein, and 646mL of free water a night.   Medications: ferrous sulfate, folic acid, lasix, MVI  Electrolyte/Renal Profile and Glucose Profile:   Recent Labs Lab 04/06/15 1558 04/07/15 0508  NA 138 138  K 5.0 4.5  CL 97* 97*  CO2 34* 33*  BUN 36* 32*  CREATININE 0.85 0.90  CALCIUM 9.3 9.1  GLUCOSE 185* 148*   Protein Profile:  Recent Labs Lab 04/06/15 1558  ALBUMIN 2.3*    Gastrointestinal Profile: Last BM:  04/06/2015   Nutrition-Focused Physical Exam Findings: Nutrition-Focused physical exam completed. Findings are no fat depletion, mild-moderate muscle depletion, and no edema.     Weight Change: Stephen Taylor reports stable weight recently. Per [facility] weight loss 3% in 3.5 months   Skin:  Reviewed, no issues  Height:   Ht Readings from Last 1 Encounters:  04/06/15 5\' 11"  (1.803  m)    Weight:   Wt Readings from Last 1 Encounters:  04/06/15 174 lb 3.2 oz (79.017 kg)   Wt Readings from Last 10 Encounters:  04/06/15 174 lb 3.2 oz (79.017 kg)  12/24/14 180 lb (81.647 kg)  10/31/14 190 lb (86.183 kg)    BMI:  Body mass index is 24.31 kg/(m^2).  Estimated Nutritional Needs:   Kcal:  BEE: 1512kcals, TEE: (IF 1.1-1.3)(AF 1.2) 1996-2360kcals  Protein:  87-103g protein (1.1-1.3g/kg)  Fluid:  1975-2313mL of fluid (25-47mL/kg)   EDUCATION NEEDS:   Education needs no appropriate at this time   [place], RD, LDN Pager [phone]

## 2015-04-07 NOTE — Plan of Care (Signed)
Problem: Discharge Progression Outcomes Goal: Other Discharge Outcomes/Goals Plan of care progress to goal: - No complaints of pain. - Continues ABX. - Has PEG tube in place, PEG tube feeding at 32ml/hr during the night and stop in am per MD order. Lurline Idol in place. - Uses urinal at bedside, will continue to monitor.

## 2015-04-07 NOTE — Progress Notes (Signed)
[facility] [place] NAME: Stephen Taylor    MR#:  [id number]  DATE OF BIRTH:  1932/06/09  SUBJECTIVE:  CHIEF COMPLAINT:   Chief Complaint  Patient presents with  . Shortness of Breath   Patient here due to worsening shortness of breath and weakness and noted to have pneumonia. Wife at bedside and still feels the patient is quite weak.    REVIEW OF SYSTEMS:    Review of Systems  Constitutional: Negative for fever and chills.  HENT: Negative for congestion and tinnitus.   Eyes: Negative for blurred vision and double vision.  Respiratory: Positive for cough, sputum production and shortness of breath. Negative for wheezing.   Cardiovascular: Negative for chest pain, orthopnea and PND.  Gastrointestinal: Negative for nausea, vomiting, abdominal pain and diarrhea.  Genitourinary: Negative for dysuria and hematuria.  Neurological: Positive for weakness. Negative for dizziness, sensory change and focal weakness.  All other systems reviewed and are negative.   Nutrition: tube feeds Tolerating Diet: Yes Tolerating PT:  Yes     DRUG ALLERGIES:   Allergies  Allergen Reactions  . Morphine And Related Other (See Comments)    Reaction:  Altered mental status    VITALS:  Blood pressure 114/48, pulse 72, temperature 98.8 F (37.1 C), temperature source Oral, resp. rate 20, height 5\' 11"  (1.803 m), weight 79.017 kg (174 lb 3.2 oz), SpO2 100 %.  PHYSICAL EXAMINATION:   Physical Exam  GENERAL:  81 y.o.-year-old patient sitting up in the bed in no acute distress.  EYES: Pupils equal, round, reactive to light and accommodation. No scleral icterus. Extraocular muscles intact.  HEENT: Head atraumatic, normocephalic. Oropharynx and nasopharynx clear. Positive tracheostomy NECK:  Supple, no jugular venous distention. No thyroid enlargement, no tenderness.  LUNGS: Prolonged inspiratory and expiratory phase. No rales, rhonchi, wheezes. No  use of accessory muscles of respiration.  CARDIOVASCULAR: S1, S2 normal. No murmurs, rubs, or gallops.  ABDOMEN: Soft, nontender, nondistended. Bowel sounds present. No organomegaly or mass. Positive PEG tube in place  EXTREMITIES: No cyanosis, clubbing or edema b/l.    NEUROLOGIC: Cranial nerves II through XII are intact. No focal Motor or sensory deficits b/l. Globally weak   PSYCHIATRIC: The patient is alert and oriented x 3. Good affect SKIN: No obvious rash, lesion, or ulcer.    LABORATORY PANEL:   CBC  Recent Labs Lab 04/07/15 0508  WBC 17.2*  HGB 8.6*  HCT 28.8*  PLT 372   ------------------------------------------------------------------------------------------------------------------  Chemistries   Recent Labs Lab 04/06/15 1558 04/07/15 0508  NA 138 138  K 5.0 4.5  CL 97* 97*  CO2 34* 33*  GLUCOSE 185* 148*  BUN 36* 32*  CREATININE 0.85 0.90  CALCIUM 9.3 9.1  AST 23  --   ALT 35  --   ALKPHOS 122  --   BILITOT 0.3  --    ------------------------------------------------------------------------------------------------------------------  Cardiac Enzymes  Recent Labs Lab 04/06/15 1558  TROPONINI 0.05*   ------------------------------------------------------------------------------------------------------------------  RADIOLOGY:  Dg Chest 2 View  04/06/2015   CLINICAL DATA:  Shortness of breath  EXAM: CHEST  2 VIEW  COMPARISON:  11/13/2014 chest radiograph  FINDINGS: Stable cardiomediastinal silhouette with normal heart size and atherosclerotic aortic arch. No pneumothorax. No pleural effusion. Re- demonstrated are large bilateral lower lobe lung masses, which have increased in size since 11/13/2014, for example a 7.8 cm left lower lobe lung mass, previously 6.0 cm. No overt pulmonary edema. Mild patchy bibasilar lung  opacities.  IMPRESSION: 1. Enlarging bilateral lower lobe lung masses, in keeping with progressive pulmonary metastases versus synchronous  primary lung malignancy. 2. New mild patchy bibasilar lung opacities, which are nonspecific, cannot exclude aspiration and/or pneumonia.   Electronically Signed   By: Ilona Sorrel M.D.   On: 04/06/2015 16:53     ASSESSMENT AND PLAN:   81 year old male with past medical history of throat cancer, lung cancer, prostate cancer, history of coronary disease, COPD, BPH, osteoarthritis who presented to the hospital due to weakness and worsening shortness of breath and noted to have pneumonia.  #1 pneumonia-this is likely the cause of patient's acute on chronic respiratory failure. -Patient did not improve with oral antibiotics and prednisone taper. Started on IV vancomycin, Zosyn. -Follow sputum, blood cultures. Continue aggressive primary toileting.  #2 generalized weakness-this is likely multifactorial. Probably related to underlying pneumonia, deconditioning, and also underlying malignancy.  -continue IV antibiotics with pneumonia. Await physical therapy evaluation  #3 depression-continue Celexa  #4 anxiety-continue Ativan.  #5 COPD-no acute exacerbation. Continue duo nebs when necessary.   All the records are reviewed and case discussed with Care Management/Social Workerr. Management plans discussed with the patient, family and they are in agreement.  CODE STATUS: DO NOT RESUSCITATE  DVT Prophylaxis: Lovenox  TOTAL TIME TAKING CARE OF THIS PATIENT: 25 minutes.   POSSIBLE D/C IN 1-2 DAYS, DEPENDING ON CLINICAL CONDITION.   Henreitta Leber M.D on 04/07/2015 at 1:48 PM  Between 7am to 6pm - Pager - [phone]  After 6pm go to [email or web address] - password [email or web address] [facility]  Office  [phone]  CC: Primary care physician; Lavera Guise, MD

## 2015-04-08 LAB — CBC
HEMATOCRIT: 29.3 % — AB (ref 40.0–52.0)
Hemoglobin: 8.9 g/dL — ABNORMAL LOW (ref 13.0–18.0)
MCH: 20.7 pg — AB (ref 26.0–34.0)
MCHC: 30.3 g/dL — AB (ref 32.0–36.0)
MCV: 68.4 fL — ABNORMAL LOW (ref 80.0–100.0)
Platelets: 375 10*3/uL (ref 150–440)
RBC: 4.29 MIL/uL — ABNORMAL LOW (ref 4.40–5.90)
RDW: 18 % — AB (ref 11.5–14.5)
WBC: 16.6 10*3/uL — ABNORMAL HIGH (ref 3.8–10.6)

## 2015-04-08 LAB — CREATININE, SERUM
CREATININE: 0.83 mg/dL (ref 0.61–1.24)
GFR calc Af Amer: >60 mL/min (ref >60)
GFR calc non Af Amer: >60 mL/min (ref >60)

## 2015-04-08 LAB — VANCOMYCIN, TROUGH: Vancomycin Tr: 17 ug/mL (ref 10–20)

## 2015-04-08 MED ORDER — LORAZEPAM 2 MG/ML IJ SOLN: 0.5000 mg | Freq: Four times a day (QID) | INTRAMUSCULAR | Status: DC | PRN

## 2015-04-08 MED ORDER — SODIUM CHLORIDE 0.9 % IV SOLN
INTRAVENOUS | Status: DC
Start: 2015-04-08 — End: 2015-04-08

## 2015-04-08 MED ORDER — PANTOPRAZOLE SODIUM 40 MG PO PACK
40.0000 mg | PACK | Freq: Two times a day (BID) | ORAL | Status: DC
  Administered 2015-04-08 – 2015-04-09 (×3): 40 mg
  Filled 2015-04-08 (×3): qty 20

## 2015-04-08 MED ORDER — CEFAZOLIN SODIUM-DEXTROSE 2-3 GM-% IV SOLR: 2.0000 g | Freq: Once | INTRAVENOUS | Status: DC

## 2015-04-08 NOTE — Progress Notes (Signed)
Nutrition Follow-up   INTERVENTION:   EN: Per RN Lindsey PEG tube clogged this morning, MD Sainani made aware. Pt tolerated Jevity 1.5 at 66mL/hr for 14 hour feed well.  Tube became clogged after feeding with medication insertion per RN. Per pt tube becomes clogged often at home as well, but pt has been able to unclog himself in the past.  Will follow poc if still remains clogged this evening; RD notes GI consulted.   NUTRITION DIAGNOSIS:   Swallowing difficulty related to cancer and cancer related treatments as evidenced by  (pt with h/o throat cancer, with Trach/PEG, receiving EN)  GOAL:   Patient will meet greater than or equal to 90% of their needs; ongoing  MONITOR:    (Energy Intake, Anthropometrics, Digestive system, Pulmonary Profile, Electrolyte and renal Profile)   ASSESSMENT:   Pt admitted with pna. Pt with h/o lung, throat cancer with trach/PEG undergoing chemotherapy. Per MD note pt also with prostate cancer.  Diet Order:       Current Nutrition: Pt tolerated Jevity 1.5 at 42mL/hr for 14 hour feed last night very well per [employer]. Free water flushes of 278mL q4 hours.   Gastrointestinal Profile: residuals <55mL per documentation overnight Last BM: 04/08/2015   Medications: ferrous sulfate, folic acid, lasix, MVI  Electrolyte/Renal Profile and Glucose Profile:   Recent Labs Lab 04/06/15 1558 04/07/15 0508 04/08/15 0731  NA 138 138  --   K 5.0 4.5  --   CL 97* 97*  --   CO2 34* 33*  --   BUN 36* 32*  --   CREATININE 0.85 0.90 0.83  CALCIUM 9.3 9.1  --   GLUCOSE 185* 148*  --    Protein Profile:  Recent Labs Lab 04/06/15 1558  ALBUMIN 2.3*   Nutritional Anemia Profile:  CBC Latest Ref Rng 04/08/2015 04/07/2015 04/06/2015  WBC 3.8 - 10.6 K/uL 16.6(H) 17.2(H) 21.9(H)  Hemoglobin 13.0 - 18.0 g/dL 8.9(L) 8.6(L) 8.8(L)  Hematocrit 40.0 - 52.0 % 29.3(L) 28.8(L) 29.8(L)  Platelets 150 - 440 K/uL 375 372 387    Weight Trend since  Admission: Filed Weights   04/06/15 1535 04/06/15 1902  Weight: 180 lb (81.647 kg) 174 lb 3.2 oz (79.017 kg)     Skin:  Reviewed, no issues   BMI:  Body mass index is 24.31 kg/(m^2).  Estimated Nutritional Needs:   Kcal:  BEE: 1512kcals, TEE: (IF 1.1-1.3)(AF 1.2) 1996-2360kcals  Protein:  87-103g protein (1.1-1.3g/kg)  Fluid:  1975-2321mL of fluid (25-40mL/kg)  EDUCATION NEEDS:   Education needs no appropriate at this time   [place], RD, LDN Pager [phone]

## 2015-04-08 NOTE — Plan of Care (Signed)
Problem: Discharge Progression Outcomes Goal: Other Discharge Outcomes/Goals Plan of care progress to goal: - No complaints of pain. - Continues ABX. - Has PEG tube in place, continuous feeding at night.   - Trach in place. - Uses urinal at bedside, will continue to monitor.

## 2015-04-08 NOTE — Progress Notes (Addendum)
ANTIBIOTIC CONSULT NOTE - Follow Up  Pharmacy Consult for Vancomycin/Zosyn Indication: pneumonia  Allergies  Allergen Reactions  . Morphine And Related Other (See Comments)    Reaction:  Altered mental status    Patient Measurements: Height: 5\' 11"  (180.3 cm) Weight: 174 lb 3.2 oz (79.017 kg) IBW/kg (Calculated) : 75.3  Vital Signs: Temp: 98 F (36.7 C) (10/12 0456) Temp Source: Oral (10/12 0456) BP: 133/43 mmHg (10/12 0458) Pulse Rate: 71 (10/12 0458) Intake/Output from previous day: 10/11 0701 - 10/12 0700 In: 454 [NG/GT:260; IV Piggyback:194] Out: 1225 [Urine:1225] Intake/Output from this shift:    Labs:  Recent Labs  04/06/15 1558 04/07/15 0508 04/08/15 0731  WBC 21.9* 17.2* 16.6*  HGB 8.8* 8.6* 8.9*  PLT 387 372 375  CREATININE 0.85 0.90 0.83   Estimated Creatinine Clearance: 74.3 mL/min (by C-G formula based on Cr of 0.83).  Recent Labs  04/08/15 0731  [place]     Microbiology: Recent Results (from the past 720 hour(s))  Culture, expectorated sputum-assessment     Status: None   Collection Time: 04/07/15  6:25 AM  Result Value Ref Range Status   Specimen Description SPUTUM  Final   Special Requests Immunocompromised  Final   Sputum evaluation THIS SPECIMEN IS ACCEPTABLE FOR SPUTUM CULTURE  Final   Report Status 04/07/2015 FINAL  Final    Medical History: Past Medical History  Diagnosis Date  . Cancer ([place])   . COPD (chronic obstructive pulmonary disease) ([place])   . Coronary artery disease   . Stroke ([place])   . Depression   . Sleep apnea   . MRSA pneumonia ([place])   . Barrett's esophagus   . BPH (benign prostatic hyperplasia)   . Nephrolithiasis   . Osteoarthritis     Medications:  Anti-infectives    Start     Dose/Rate Route Frequency Ordered Stop   04/07/15 0800  vancomycin (VANCOCIN) IVPB 1000 mg/200 mL premix     1,000 mg 200 mL/hr over 60 Minutes Intravenous Every 12 hours 04/06/15 1919     04/07/15 0030   piperacillin-tazobactam (ZOSYN) IVPB 3.375 g     3.375 g 12.5 mL/hr over 240 Minutes Intravenous 3 times per day 04/06/15 1919     04/06/15 1745  vancomycin (VANCOCIN) injection 1 g  Status:  Discontinued     1 g Intravenous  Once 04/06/15 1731 04/06/15 1736   04/06/15 1745  vancomycin (VANCOCIN) IVPB 1000 mg/200 mL premix     1,000 mg 200 mL/hr over 60 Minutes Intravenous  Once 04/06/15 1736 04/07/15 0225   04/06/15 1730  piperacillin-tazobactam (ZOSYN) IVPB 3.375 g     3.375 g 12.5 mL/hr over 240 Minutes Intravenous  Once 04/06/15 1726 04/06/15 1829     Assessment: 80 yo male admitted for pneumonia.  Pharmacy consulted to dose vancomycin and zosyn.    SCr: 0.83, est CrCl~75 mL/min, ke: 0.067, t1/2: 10.3 h, Vd: 54 L  Trough on 10/12 at 0730: 17   Goal of Therapy:  Vancomycin trough level 15-20 mcg/ml  Plan:  Continue Vancomycin 1 gm IV q12h as trough this AM was within desired range at 17.  Will obtain follow up trough levels as clinically indicated.  Will continue Zosyn 3.375 gm IV q8h per EI protocol.  Follow up culture results   Pharmacy will continue to follow.  Murrell Converse, PharmD Clinical Pharmacist 04/08/2015

## 2015-04-08 NOTE — Consult Note (Signed)
GI Inpatient Consult Note  Reason for Consult: Clogged PEG tube   Attending Requesting Consult: Dr. Theodoro Doing  History of Present Illness: Stephen Taylor is a 81 y.o. male with pmh of lung and throat cancer with tracheostomoy has had a PEG tube for the past 15 years.  His wife is present and helps with history.  She reports that the tube was changed out approx. 6 months ago at [facility] ED.  They reports it will clog at home and they unclog it themselves with a wire coat hanger.  The tube itself appears damaged and to have mold inside of it.  The nurse, Mendel Ryder, reported that the tube was leaking around the insertion area.  She cleaned it up and changed the dressing.  Mendel Ryder tried for one hour to unclog the tube with flushed of juice and carbonated soda without success.  Patient is currently being treated for pneumonia, being treated with Zosyn and Vanc WBC 16.6.   Past Medical History:  Past Medical History  Diagnosis Date  . Cancer ([place])   . COPD (chronic obstructive pulmonary disease) ([place])   . Coronary artery disease   . Stroke ([place])   . Depression   . Sleep apnea   . MRSA pneumonia ([place])   . Barrett's esophagus   . BPH (benign prostatic hyperplasia)   . Nephrolithiasis   . Osteoarthritis     Problem List: Patient Active Problem List   Diagnosis Date Noted  . Pneumonia 04/06/2015    Past Surgical History: Past Surgical History  Procedure Laterality Date  . Tracheostomy N/A   . Gastrostomy w/ feeding tube N/A   . Laminectomy    . Rotator cuff repair    . Lung removal, partial    . Esophageogastrectomy    . Peg tube placement    . Renal calculi    . Ureteral stent placement    . Flexible bronchoscopy N/A 12/24/2014    Procedure: FLEXIBLE BRONCHOSCOPY;  Surgeon: Allyne Gee, MD;  Location: [facility] ORS;  Service: Pulmonary;  Laterality: N/A;    Allergies: Allergies  Allergen Reactions  . Morphine And Related Other (See Comments)    Reaction:  Altered mental status     Home Medications: Prescriptions prior to admission  Medication Sig Dispense Refill Last Dose  . aspirin EC 81 MG tablet 81 mg by PEG Tube route daily.    04/06/2015 at 1000  . citalopram (CELEXA) 20 MG tablet 20 mg by PEG Tube route daily.    04/06/2015 at Unknown time  . EPINEPHrine (EPIPEN 2-PAK) 0.3 mg/0.3 mL IJ SOAJ injection Inject 0.3 mg into the muscle once as needed (for severe allergic reaction).   PRN at PRN  . Fe Fum-FA-B Cmp-C-Zn-Mg-Mn-Cu (CENTRATEX) 106-1 MG CAPS 1 capsule by PEG Tube route daily.    04/06/2015 at Unknown time  . ferrous sulfate 325 (65 FE) MG EC tablet 325 mg by PEG Tube route daily.    04/06/2015 at Unknown time  . folic acid (FOLVITE) 1 MG tablet 1 mg by PEG Tube route daily.    04/06/2015 at Unknown time  . HYDROcodone-acetaminophen (NORCO/VICODIN) 5-325 MG per tablet 1 tablet by PEG Tube route 3 (three) times daily as needed for moderate pain.    04/06/2015 at 1000  . ipratropium-albuterol (DUONEB) 0.5-2.5 (3) MG/3ML SOLN Take 3 mLs by nebulization every 6 (six) hours as needed (for shortness of breath).   04/06/2015 at Unknown time  . loratadine (CLARITIN) 10 MG tablet 10 mg by  PEG Tube route daily.    04/06/2015 at Unknown time  . LORazepam (ATIVAN) 0.5 MG tablet 0.5 mg by PEG Tube route 4 (four) times daily as needed for anxiety.    04/06/2015 at Unknown time  . montelukast (SINGULAIR) 10 MG tablet 10 mg by PEG Tube route at bedtime.    04/05/2015 at Unknown time  . Multiple Vitamin (MULTIVITAMIN WITH MINERALS) TABS tablet 1 tablet by PEG Tube route daily.    04/06/2015 at Unknown time  . pantoprazole (PROTONIX) 40 MG tablet 40 mg by PEG Tube route 2 (two) times daily.    04/06/2015 at Unknown time  . predniSONE (DELTASONE) 10 MG tablet 10 mg by PEG Tube route 3 (three) times daily.    04/06/2015 at Unknown time  . pseudoephedrine-guaifenesin (MUCINEX D) 60-600 MG per tablet 1 tablet by PEG Tube route 2 (two) times daily as needed for congestion.    04/06/2015  at 1000  . sulfamethoxazole-trimethoprim (BACTRIM DS,SEPTRA DS) 800-160 MG tablet 1 tablet by PEG Tube route 2 (two) times daily.   04/06/2015 at Unknown time  . tamsulosin (FLOMAX) 0.4 MG CAPS capsule 0.4 mg by PEG Tube route daily.    04/06/2015 at Unknown time  . zolpidem (AMBIEN) 5 MG tablet 5 mg by PEG Tube route at bedtime as needed for sleep.    04/05/2015 at Unknown time  . furosemide (LASIX) 20 MG tablet Take 1 tablet (20 mg total) by mouth daily. (Patient not taking: Reported on 04/06/2015) 2 tablet 0   . predniSONE (DELTASONE) 10 MG tablet Take 5 tablets (50 mg total) by mouth daily. (Patient not taking: Reported on 04/06/2015) 20 tablet 0    Home medication reconciliation was completed with the patient.   Scheduled Inpatient Medications:   . citalopram  20 mg Per Tube Daily  . enoxaparin (LOVENOX) injection  40 mg Subcutaneous Q24H  . ferrous sulfate  325 mg Oral Daily  . folic acid  1 mg Per Tube Daily  . free water  200 mL Per Tube QID  . furosemide  20 mg Oral Daily  . loratadine  10 mg Per Tube Daily  . montelukast  10 mg Per Tube QHS  . multivitamin with minerals  1 tablet Per Tube Daily  . piperacillin-tazobactam (ZOSYN)  IV  3.375 g Intravenous 3 times per day  . sodium chloride  3 mL Intravenous Q12H  . tamsulosin  0.4 mg Oral Daily  . vancomycin  1,000 mg Intravenous Q12H    Continuous Inpatient Infusions:   . feeding supplement (JEVITY 1.5 CAL/FIBER) 1,000 mL (04/07/15 1900)    PRN Inpatient Medications:  HYDROcodone-acetaminophen, ipratropium-albuterol, LORazepam, LORazepam, sodium chloride, zolpidem  Family History: family history includes Diabetes in his brother.    Social History:   reports that he quit smoking about 30 years ago. His smoking use included Cigarettes. He has a 60 pack-year smoking history. He does not have any smokeless tobacco history on file. He reports that he does not drink alcohol or use illicit drugs.   Review of  Systems: Constitutional: Weight is stable.  Eyes: No changes in vision. ENT: No oral lesions, sore throat.  GI: see HPI.  Heme/Lymph: No easy bruising.  CV: No chest pain.  GU: No hematuria.  Integumentary: No rashes.  Neuro: No headaches.  Psych: No depression/anxiety.  Endocrine: No heat/cold intolerance.  Allergic/Immunologic: No urticaria.  Resp: Positive for cough and SOB.  Musculoskeletal: No joint swelling.    Physical Examination: BP 133/43 mmHg  Pulse 71  Temp(Src) 98 F (36.7 C) (Oral)  Resp 18  Ht 5\' 11"  (1.803 m)  Wt 79.017 kg (174 lb 3.2 oz)  BMI 24.31 kg/m2  SpO2 99% Gen: NAD, alert and oriented x 4 HEENT: PEERLA, EOMI, Neck: supple, no JVD or thyromegaly Chest: CTA bilaterally, no wheezes, crackles, or other adventitious sounds CV: murmurs noted Abd: soft, NT, ND, +BS in all four quadrants; no HSM, guarding, ridigity, or rebound tenderness Ext: no edema, well perfused with 2+ pulses, Skin: no rash or lesions noted Lymph: no LAD  Data: Lab Results  Component Value Date   WBC 16.6* 04/08/2015   HGB 8.9* 04/08/2015   HCT 29.3* 04/08/2015   MCV 68.4* 04/08/2015   PLT 375 04/08/2015    Recent Labs Lab 04/06/15 1558 04/07/15 0508 04/08/15 0731  HGB 8.8* 8.6* 8.9*   Lab Results  Component Value Date   NA 138 04/07/2015   K 4.5 04/07/2015   CL 97* 04/07/2015   CO2 33* 04/07/2015   BUN 32* 04/07/2015   CREATININE 0.83 04/08/2015   Lab Results  Component Value Date   ALT 35 04/06/2015   AST 23 04/06/2015   ALKPHOS 122 04/06/2015   BILITOT 0.3 04/06/2015   No results for input(s): APTT, INR, PTT in the last 168 hours.   Imaging:  CLINICAL DATA: Shortness of breath  EXAM: CHEST 2 VIEW  COMPARISON: 11/13/2014 chest radiograph  FINDINGS: Stable cardiomediastinal silhouette with normal heart size and atherosclerotic aortic arch. No pneumothorax. No pleural effusion. Re- demonstrated are large bilateral lower lobe lung masses,  which have increased in size since 11/13/2014, for example a 7.8 cm left lower lobe lung mass, previously 6.0 cm. No overt pulmonary edema. Mild patchy bibasilar lung opacities.  IMPRESSION: 1. Enlarging bilateral lower lobe lung masses, in keeping with progressive pulmonary metastases versus synchronous primary lung malignancy. 2. New mild patchy bibasilar lung opacities, which are nonspecific, cannot exclude aspiration and/or pneumonia.   Electronically Signed  By: Ilona Sorrel M.D.  On: 04/06/2015 16:53 Assessment/Plan: Mr. Carne is a 81 y.o. male with clogged PEG tube.  Attempts to unclog were unsuccessful.    Recommendations: We will proceed with new G tube placement tomorrow as long as patients respiratory status is stable enough to proceed.  Thank you for the consult. Please call with questions or concerns.  Salvadore Farber, PA-C  I personally performed these services.

## 2015-04-08 NOTE — Progress Notes (Signed)
[name] [place] NAME: Stephen Taylor    MR#:  [id number]  DATE OF BIRTH:  [birth year]  SUBJECTIVE:  CHIEF COMPLAINT:   Chief Complaint  Patient presents with  . Shortness of Breath   Patient here due to worsening shortness of breath and weakness and noted to have pneumonia. Patient says his strength has improved. His PEG tube noted to be clogged today. Overall feels better.  REVIEW OF SYSTEMS:    Review of Systems  Constitutional: Negative for fever and chills.  HENT: Negative for congestion and tinnitus.   Eyes: Negative for blurred vision and double vision.  Respiratory: Positive for cough, sputum production and shortness of breath. Negative for wheezing.   Cardiovascular: Negative for chest pain, orthopnea and PND.  Gastrointestinal: Negative for nausea, vomiting, abdominal pain and diarrhea.  Genitourinary: Negative for dysuria and hematuria.  Neurological: Positive for weakness. Negative for dizziness, sensory change and focal weakness.  All other systems reviewed and are negative.   Nutrition: tube feeds Tolerating Diet: Yes Tolerating PT:  Yes     DRUG ALLERGIES:   Allergies  Allergen Reactions  . Morphine And Related Other (See Comments)    Reaction:  Altered mental status    VITALS:  Blood pressure 133/53, pulse 81, temperature 98.7 F (37.1 C), temperature source Oral, resp. rate 18, height 5\' 11"  (1.803 m), weight 79.017 kg (174 lb 3.2 oz), SpO2 99 %.  PHYSICAL EXAMINATION:   Physical Exam  GENERAL:  81 y.o.-year-old patient sitting up in the bed in no acute distress.  EYES: Pupils equal, round, reactive to light and accommodation. No scleral icterus. Extraocular muscles intact.  HEENT: Head atraumatic, normocephalic. Oropharynx and nasopharynx clear. Positive tracheostomy NECK:  Supple, no jugular venous distention. No thyroid enlargement, no tenderness.  LUNGS: Good air entry bilaterally. No  rales, rhonchi, wheezes. No use of accessory muscles of respiration.  CARDIOVASCULAR: S1, S2 normal. No murmurs, rubs, or gallops.  ABDOMEN: Soft, nontender, nondistended. Bowel sounds present. No organomegaly or mass. Positive PEG tube in place  EXTREMITIES: No cyanosis, clubbing or edema b/l.    NEUROLOGIC: Cranial nerves II through XII are intact. No focal Motor or sensory deficits b/l. Globally weak   PSYCHIATRIC: The patient is alert and oriented x 3. Good affect SKIN: No obvious rash, lesion, or ulcer.    LABORATORY PANEL:   CBC  Recent Labs Lab 04/08/15 0731  WBC 16.6*  HGB 8.9*  HCT 29.3*  PLT 375   ------------------------------------------------------------------------------------------------------------------  Chemistries   Recent Labs Lab 04/06/15 1558 04/07/15 0508 04/08/15 0731  NA 138 138  --   K 5.0 4.5  --   CL 97* 97*  --   CO2 34* 33*  --   GLUCOSE 185* 148*  --   BUN 36* 32*  --   CREATININE 0.85 0.90 0.83  CALCIUM 9.3 9.1  --   AST 23  --   --   ALT 35  --   --   ALKPHOS 122  --   --   BILITOT 0.3  --   --    ------------------------------------------------------------------------------------------------------------------  Cardiac Enzymes  Recent Labs Lab 04/06/15 1558  TROPONINI 0.05*   ------------------------------------------------------------------------------------------------------------------  RADIOLOGY:  Dg Chest 2 View  04/06/2015  CLINICAL DATA:  Shortness of breath EXAM: CHEST  2 VIEW COMPARISON:  11/13/2014 chest radiograph FINDINGS: Stable cardiomediastinal silhouette with normal heart size and atherosclerotic aortic arch. No pneumothorax. No pleural effusion. Re- demonstrated  are large bilateral lower lobe lung masses, which have increased in size since 11/13/2014, for example a 7.8 cm left lower lobe lung mass, previously 6.0 cm. No overt pulmonary edema. Mild patchy bibasilar lung opacities. IMPRESSION: 1. Enlarging  bilateral lower lobe lung masses, in keeping with progressive pulmonary metastases versus synchronous primary lung malignancy. 2. New mild patchy bibasilar lung opacities, which are nonspecific, cannot exclude aspiration and/or pneumonia. Electronically Signed   By: Ilona Sorrel M.D.   On: 04/06/2015 16:53     ASSESSMENT AND PLAN:   81 year old male with past medical history of throat cancer, lung cancer, prostate cancer, history of coronary disease, COPD, BPH, osteoarthritis who presented to the hospital due to weakness and worsening shortness of breath and noted to have pneumonia.  #1 pneumonia-this is likely the cause of patient's acute on chronic respiratory failure. -Patient did not improve with oral antibiotics and prednisone taper as outpatient.   -Continue IV vancomycin/Zosyn and will likely switch to Oral abx in 1-2 days. Cultures so far negative. - Continue aggressive primary toileting.  #2 generalized weakness-this is likely multifactorial. Probably related to underlying pneumonia, deconditioning, and also underlying malignancy.  -continue IV antibiotics with pneumonia.  -Seen by physical therapy and recommended home health services which will be arranged prior to discharge.  #3 depression-continue Celexa  #4 anxiety-continue Ativan.  #5 COPD-no acute exacerbation. Continue duo nebs when necessary.  #6 PEG tube dysfunction-patient's PEG tube is clogged.  We'll get GI consult patient likely needs his PEG replaced and to be done tomorrow.    All the records are reviewed and case discussed with Care Management/Social Workerr. Management plans discussed with the patient, family and they are in agreement.  CODE STATUS: DO NOT RESUSCITATE  DVT Prophylaxis: Lovenox  TOTAL TIME TAKING CARE OF THIS PATIENT: 30 minutes.   POSSIBLE D/C IN 1-2 DAYS, DEPENDING ON CLINICAL CONDITION.   Henreitta Leber M.D on 04/08/2015 at 2:40 PM  Between 7am to 6pm - Pager -  [phone]  After 6pm go to [email or web address] - password [email or web address] [facility]  Office  [phone]  CC: Primary care physician; Lavera Guise, MD

## 2015-04-08 NOTE — Progress Notes (Signed)
PT Cancellation Note  Patient Details Name: Stephen Taylor MRN: [id number] DOB: 1931/09/01   Cancelled Treatment:    Reason Eval/Treat Not Completed: Other (comment) (See PT note for further details)  PT attempted this morning, however pt unwilling to participate in therapy secondary to feeling worse, having pain in his arm, and not sleeping well. Nurse present. PT will attempt at later time/date when appropriate.   Janyth Contes 04/08/2015, 12:18 PM  Janyth Contes, SPT. [phone]

## 2015-04-08 NOTE — Consult Note (Signed)
  Pt seen and examined. G tube replaced in ER about 6 months. Was working fine until current hospitalization. Nursing unable to unclog. I tried unclogging with a guidewire. Most of the obstruction appeared to be at the tip of G tube. Was able to unclog part of the obstruction. However, still difficult to flush G tube with any ease. Therefore, will need to place a new G tube tomorrow AM either at the same site or a different site. G site looks intact and does not look infected. Hopefully, pt's respiratory status will be stable enough to proceed with EGD with G tube tomorrow. Thanks.

## 2015-04-08 NOTE — Consult Note (Signed)
  I was informed by nursing staff that G tube now is flushing well. No issues with TF tonight. Therefore, will not schedule PEG tomorrow. Will sign off. Call back if issues arise again. thanks

## 2015-04-08 NOTE — Plan of Care (Addendum)
Problem: Discharge Progression Outcomes Goal: Other Discharge Outcomes/Goals Outcome: Progressing Patient remains alert and oriented oxygen down to 6 liters, PEG tube became clogged this am during med pass, GI consulted and visited patient and unclogged tube, test run made of feedings to make sure it did not clog up and it ran smoothly, new IV site, getting continuous feedings tonight possible discharge back home in a couple days.

## 2015-04-09 ENCOUNTER — Encounter
Admission: EM | Disposition: A | Discharge status: Home Health | Payer: Self-pay | Source: Home / Self Care | Attending: Specialist

## 2015-04-09 LAB — CBC
HEMATOCRIT: 31 % — AB (ref 40.0–52.0)
HEMOGLOBIN: 9 g/dL — AB (ref 13.0–18.0)
MCH: 20.2 pg — ABNORMAL LOW (ref 26.0–34.0)
MCHC: 29.1 g/dL — ABNORMAL LOW (ref 32.0–36.0)
MCV: 69.3 fL — ABNORMAL LOW (ref 80.0–100.0)
Platelets: 424 10*3/uL (ref 150–440)
RBC: 4.47 MIL/uL (ref 4.40–5.90)
RDW: 17.6 % — AB (ref 11.5–14.5)
WBC: 15.3 10*3/uL — AB (ref 3.8–10.6)

## 2015-04-09 SURGERY — REPLACEMENT, PEG TUBE, WITHOUT ENDOSCOPY
Anesthesia: General | Laterality: Left

## 2015-04-09 MED ORDER — AMOXICILLIN-POT CLAVULANATE 500-125 MG PO TABS: 1.0000 | ORAL_TABLET | Freq: Two times a day (BID) | ORAL | Status: DC

## 2015-04-09 MED ORDER — PREDNISONE 10 MG PO TABS: 10.0000 mg | ORAL_TABLET | Freq: Every day | ORAL | Status: DC

## 2015-04-09 NOTE — Care Management (Signed)
Discharge to home today per Dr. Verdell Carmine. Will be followed by [facility] for home health and physical therapy. Followed by [facility] for PEG supplies. Wife will transport. Shelbie Ammons RN MSN Care Management [phone]

## 2015-04-09 NOTE — Discharge Summary (Signed)
Stephen Taylor at [facility] NAME: Stephen Taylor    MR#:  [id number]  DATE OF BIRTH:  18-Mar-1932  DATE OF ADMISSION:  04/06/2015 ADMITTING PHYSICIAN: Stephen Basta, MD  DATE OF DISCHARGE: 04/09/2015 10:30 AM  PRIMARY CARE PHYSICIAN: Stephen Guise, MD    ADMISSION DIAGNOSIS:  Healthcare-associated pneumonia [J18.9]  DISCHARGE DIAGNOSIS:  Principal Problem:   Pneumonia   SECONDARY DIAGNOSIS:   Past Medical History  Diagnosis Date  . Cancer ([place])   . COPD (chronic obstructive pulmonary disease) ([place])   . Coronary artery disease   . Stroke ([place])   . Depression   . Sleep apnea   . MRSA pneumonia ([place])   . Barrett's esophagus   . BPH (benign prostatic hyperplasia)   . Nephrolithiasis   . Osteoarthritis     HOSPITAL COURSE:   81 year old male with past medical history of throat cancer, lung cancer, prostate cancer, history of coronary disease, COPD, BPH, osteoarthritis who presented to the hospital due to weakness and worsening shortness of breath and noted to have pneumonia.  #1 pneumonia-this is likely the cause of patient's acute on chronic respiratory failure. -Patient did not improve with oral antibiotics and prednisone taper as outpatient and therefore was admitted to the hospital and started on broad-spectrum IV antibiotics with vancomycin and Zosyn. -After aggressive IV antibiotic therapy patient's clinical symptoms have improved and he has more energy and less sputum production and bronchospasm. -His sputum cultures grew out just yeast. Since she is clinically doing well he's currently being discharged on oral Augmentin for another 10 days. Patient will follow-up with his pulmonologist  Stephen Taylor.   #2 generalized weakness-this is likely multifactorial. Probably related to underlying pneumonia, deconditioning, and also underlying malignancy.  -This is also significantly improved since admission. Patient was  seen by physical therapy and recommended home health services which was arranged for him prior to discharge.  #3 depression-he will continue Celexa  #4 anxiety-continue Ativan.  #5 COPD-no acute exacerbation.  - pt. Will cont. duonebs as needed.  He will finish his Prednisone taper he was taking prior to coming to hospital.    #6 PEG tube dysfunction-patient's PEG tube was clogged while in the hospital.  A GI consult was obtained and they were able to unclog it and it's currently functioning well.    Patient is being discharged with home health physical therapy/nursing services.  DISCHARGE CONDITIONS:   Stable  CONSULTS OBTAINED:     DRUG ALLERGIES:   Allergies  Allergen Reactions  . Morphine And Related Other (See Comments)    Reaction:  Altered mental status    DISCHARGE MEDICATIONS:   Discharge Medication List as of 04/09/2015 10:05 AM    START taking these medications   Details  amoxicillin-clavulanate (AUGMENTIN) 500-125 MG tablet Take 1 tablet (500 mg total) by mouth 2 (two) times daily., Starting 04/09/2015, Until Discontinued, Print      CONTINUE these medications which have CHANGED   Details  predniSONE (DELTASONE) 10 MG tablet Take 1 tablet (10 mg total) by mouth daily with breakfast., Starting 04/09/2015, Until Discontinued, Print      CONTINUE these medications which have NOT CHANGED   Details  aspirin EC 81 MG tablet 81 mg by PEG Tube route daily. , Until Discontinued, Historical Med    citalopram (CELEXA) 20 MG tablet 20 mg by PEG Tube route daily. , Until Discontinued, Historical Med    EPINEPHrine (EPIPEN 2-PAK) 0.3 mg/0.3 mL IJ  SOAJ injection Inject 0.3 mg into the muscle once as needed (for severe allergic reaction)., Until Discontinued, Historical Med    Fe Fum-FA-B Cmp-C-Zn-Mg-Mn-Cu (CENTRATEX) 106-1 MG CAPS 1 capsule by PEG Tube route daily. , Until Discontinued, Historical Med    ferrous sulfate 325 (65 FE) MG EC tablet 325 mg by PEG Tube  route daily. , Until Discontinued, Historical Med    folic acid (FOLVITE) 1 MG tablet 1 mg by PEG Tube route daily. , Until Discontinued, Historical Med    HYDROcodone-acetaminophen (NORCO/VICODIN) 5-325 MG per tablet 1 tablet by PEG Tube route 3 (three) times daily as needed for moderate pain. , Until Discontinued, Historical Med    ipratropium-albuterol (DUONEB) 0.5-2.5 (3) MG/3ML SOLN Take 3 mLs by nebulization every 6 (six) hours as needed (for shortness of breath)., Until Discontinued, Historical Med    loratadine (CLARITIN) 10 MG tablet 10 mg by PEG Tube route daily. , Until Discontinued, Historical Med    LORazepam (ATIVAN) 0.5 MG tablet 0.5 mg by PEG Tube route 4 (four) times daily as needed for anxiety. , Until Discontinued, Historical Med    montelukast (SINGULAIR) 10 MG tablet 10 mg by PEG Tube route at bedtime. , Until Discontinued, Historical Med    Multiple Vitamin (MULTIVITAMIN WITH MINERALS) TABS tablet 1 tablet by PEG Tube route daily. , Until Discontinued, Historical Med    pantoprazole (PROTONIX) 40 MG tablet 40 mg by PEG Tube route 2 (two) times daily. , Until Discontinued, Historical Med    pseudoephedrine-guaifenesin (MUCINEX D) 60-600 MG per tablet 1 tablet by PEG Tube route 2 (two) times daily as needed for congestion. , Until Discontinued, Historical Med    tamsulosin (FLOMAX) 0.4 MG CAPS capsule 0.4 mg by PEG Tube route daily. , Until Discontinued, Historical Med    zolpidem (AMBIEN) 5 MG tablet 5 mg by PEG Tube route at bedtime as needed for sleep. , Until Discontinued, Historical Med    furosemide (LASIX) 20 MG tablet Take 1 tablet (20 mg total) by mouth daily., Starting 11/06/2014, Until Fri 11/06/15, Print      STOP taking these medications     sulfamethoxazole-trimethoprim (BACTRIM DS,SEPTRA DS) 800-160 MG tablet          DISCHARGE INSTRUCTIONS:   DIET:  Tube feeds  DISCHARGE CONDITION:  Stable  ACTIVITY:  Activity as tolerated  OXYGEN:   Home Oxygen: Yes.     Oxygen Delivery: Trach collar  DISCHARGE LOCATION:  Home with home health nursing and physical therapy   If you experience worsening of your admission symptoms, develop shortness of breath, life threatening emergency, suicidal or homicidal thoughts you must seek medical attention immediately by calling 911 or calling your MD immediately  if symptoms less severe.  You Must read complete instructions/literature along with all the possible adverse reactions/side effects for all the Medicines you take and that have been prescribed to you. Take any new Medicines after you have completely understood and accpet all the possible adverse reactions/side effects.   Please note  You were cared for by a hospitalist during your hospital stay. If you have any questions about your discharge medications or the care you received while you were in the hospital after you are discharged, you can call the unit and asked to speak with the hospitalist on call if the hospitalist that took care of you is not available. Once you are discharged, your primary care physician will handle any further medical issues. Please note that NO REFILLS for any discharge  medications will be authorized once you are discharged, as it is imperative that you return to your primary care physician (or establish a relationship with a primary care physician if you do not have one) for your aftercare needs so that they can reassess your need for medications and monitor your lab values.     Today   Clinically feels better. Has more energy. Sputum production has improved. No further worsening shortness of breath, no nausea, vomiting, fevers.  VITAL SIGNS:  Blood pressure 152/48, pulse 88, temperature 98.3 F (36.8 C), temperature source Oral, resp. rate 20, height 5\' 11"  (1.803 m), weight 79.017 kg (174 lb 3.2 oz), SpO2 95 %.  I/O:   Intake/Output Summary (Last 24 hours) at 04/09/15 1523 Last data filed at 04/09/15  1016  Gross per 24 hour  Intake   1547 ml  Output    550 ml  Net    997 ml    PHYSICAL EXAMINATION:   GENERAL: 81 y.o.-year-old patient sitting up in the bed in no acute distress.  EYES: Pupils equal, round, reactive to light and accommodation. No scleral icterus. Extraocular muscles intact.  HEENT: Head atraumatic, normocephalic. Oropharynx and nasopharynx clear. Positive tracheostomy NECK: Supple, no jugular venous distention. No thyroid enlargement, no tenderness.  LUNGS: Good air entry bilaterally. No rales, rhonchi, wheezes. No use of accessory muscles of respiration.  CARDIOVASCULAR: S1, S2 normal. No murmurs, rubs, or gallops.  ABDOMEN: Soft, nontender, nondistended. Bowel sounds present. No organomegaly or mass. Positive PEG tube in place  EXTREMITIES: No cyanosis, clubbing or edema b/l.  NEUROLOGIC: Cranial nerves II through XII are intact. No focal Motor or sensory deficits b/l. Globally weak  PSYCHIATRIC: The patient is alert and oriented x 3. Good affect SKIN: No obvious rash, lesion, or ulcer.   DATA REVIEW:   CBC  Recent Labs Lab 04/09/15 0539  WBC 15.3*  HGB 9.0*  HCT 31.0*  PLT 424    Chemistries   Recent Labs Lab 04/06/15 1558 04/07/15 0508 04/08/15 0731  NA 138 138  --   K 5.0 4.5  --   CL 97* 97*  --   CO2 34* 33*  --   GLUCOSE 185* 148*  --   BUN 36* 32*  --   CREATININE 0.85 0.90 0.83  CALCIUM 9.3 9.1  --   AST 23  --   --   ALT 35  --   --   ALKPHOS 122  --   --   BILITOT 0.3  --   --     Cardiac Enzymes  Recent Labs Lab 04/06/15 1558  TROPONINI 0.05*    Microbiology Results  Results for orders placed or performed during the hospital encounter of 04/06/15  Culture, blood (routine x 2)     Status: None (Preliminary result)   Collection Time: 04/06/15  4:11 PM  Result Value Ref Range Status   Specimen Description BLOOD RIGHT ANTECUBITAL  Final   Special Requests BOTTLES DRAWN AEROBIC AND ANAEROBIC 10ML  Final    Culture NO GROWTH 3 DAYS  Final   Report Status PENDING  Incomplete  Culture, blood (routine x 2)     Status: None (Preliminary result)   Collection Time: 04/06/15  4:11 PM  Result Value Ref Range Status   Specimen Description BLOOD LEFT  Final   Special Requests BOTTLES DRAWN AEROBIC AND ANAEROBIC 20ML  Final   Culture NO GROWTH 3 DAYS  Final   Report Status PENDING  Incomplete  Culture,  expectorated sputum-assessment     Status: None   Collection Time: 04/07/15  6:25 AM  Result Value Ref Range Status   Specimen Description SPUTUM  Final   Special Requests Immunocompromised  Final   Sputum evaluation THIS SPECIMEN IS ACCEPTABLE FOR SPUTUM CULTURE  Final   Report Status 04/07/2015 FINAL  Final  Culture, respiratory (NON-Expectorated)     Status: None (Preliminary result)   Collection Time: 04/07/15  6:25 AM  Result Value Ref Range Status   Specimen Description SPUTUM  Final   Special Requests Immunocompromised Reflexed from [id number]  Final   Gram Stain   Final    MODERATE WBC SEEN MANY YEAST MANY GRAM POSITIVE RODS GOOD SPECIMEN - 80-90% WBCS    Culture HEAVY GROWTH YEAST IDENTIFICATION TO FOLLOW   Final   Report Status PENDING  Incomplete    RADIOLOGY:  No results found.    Management plans discussed with the patient, family and they are in agreement.  CODE STATUS:  Advance Directive Documentation        Most Recent Value   Type of Advance Directive  Healthcare Power of Attorney, Living will   Pre-existing out of facility DNR order (yellow form or pink MOST form)     "MOST" Form in Place?        TOTAL TIME TAKING CARE OF THIS PATIENT: 40 minutes.    Henreitta Leber M.D on 04/09/2015 at 3:23 PM  Between 7am to 6pm - Pager - [phone]  After 6pm go to [email or web address] - password [email or web address] [facility]  Office  [phone]  CC: Primary care physician; Stephen Guise, MD

## 2015-04-09 NOTE — Care Management Important Message (Signed)
Important Message  Patient Details  Name: Stephen Taylor MRN: [id number] Date of Birth: 06/28/1931   Medicare Important Message Given:  Yes-third notification given    Shelbie Ammons, RN 04/09/2015, 8:07 AM

## 2015-04-09 NOTE — Discharge Instructions (Signed)
°  DIET:  Tube feeds  DISCHARGE CONDITION:  Stable  ACTIVITY:  Activity as tolerated  OXYGEN:  Home Oxygen: Yes.     Oxygen Delivery: Deno Lunger.   DISCHARGE LOCATION:  Home with Home health nursing/PT.    If you experience worsening of your admission symptoms, develop shortness of breath, life threatening emergency, suicidal or homicidal thoughts you must seek medical attention immediately by calling 911 or calling your MD immediately  if symptoms less severe.  You Must read complete instructions/literature along with all the possible adverse reactions/side effects for all the Medicines you take and that have been prescribed to you. Take any new Medicines after you have completely understood and accpet all the possible adverse reactions/side effects.   Please note  You were cared for by a hospitalist during your hospital stay. If you have any questions about your discharge medications or the care you received while you were in the hospital after you are discharged, you can call the unit and asked to speak with the hospitalist on call if the hospitalist that took care of you is not available. Once you are discharged, your primary care physician will handle any further medical issues. Please note that NO REFILLS for any discharge medications will be authorized once you are discharged, as it is imperative that you return to your primary care physician (or establish a relationship with a primary care physician if you do not have one) for your aftercare needs so that they can reassess your need for medications and monitor your lab values.

## 2015-04-09 NOTE — Progress Notes (Signed)
MD order to discharge to home with home health. Reviewed discharge instructions and meds with pt an dhis wife.  Both verbalized understanding of instructions. Pt discharged.

## 2015-04-09 NOTE — Plan of Care (Signed)
Problem: Discharge Progression Outcomes Goal: Other Discharge Outcomes/Goals Outcome: Progressing Plan of care progress to goals: 1. No complaints of pain, resting comfortably in bed 2. Hemodynamically:             -VSS, afebrile              -IV Abx given as scheduled              -PEG tube in place w/ continuous feeding throughout the night              -Trach in place, Respiratory therapy checking in frequently, O2 sats stable on 6L  3. High fall risk r/t generalized weakness & multiple lines. + 1 assist to stand using urinal. Pt refuses bed alarm but understands how to use call system for assistance.

## 2015-04-11 LAB — CULTURE, BLOOD (ROUTINE X 2)
CULTURE: NO GROWTH
Culture: NO GROWTH

## 2015-04-12 LAB — CULTURE, RESPIRATORY

## 2015-04-12 LAB — CULTURE, RESPIRATORY W GRAM STAIN

## 2015-04-21 ENCOUNTER — Encounter: Payer: Self-pay | Admitting: Emergency Medicine

## 2015-04-21 ENCOUNTER — Inpatient Hospital Stay: Payer: Medicare Other

## 2015-04-21 ENCOUNTER — Emergency Department: Payer: Medicare Other

## 2015-04-21 ENCOUNTER — Inpatient Hospital Stay
Admission: EM | Admit: 2015-04-21 | Discharge: 2015-04-27 | DRG: 177 | Disposition: A | Discharge status: Home Health | Payer: Medicare Other | Attending: Internal Medicine | Admitting: Internal Medicine

## 2015-04-21 DIAGNOSIS — Y95 Nosocomial condition: Secondary | ICD-10-CM | POA: Diagnosis not present

## 2015-04-21 DIAGNOSIS — Z8546 Personal history of malignant neoplasm of prostate: Secondary | ICD-10-CM | POA: Diagnosis not present

## 2015-04-21 DIAGNOSIS — J189 Pneumonia, unspecified organism: Secondary | ICD-10-CM

## 2015-04-21 DIAGNOSIS — Z79899 Other long term (current) drug therapy: Secondary | ICD-10-CM | POA: Diagnosis not present

## 2015-04-21 DIAGNOSIS — T17890A Other foreign object in other parts of respiratory tract causing asphyxiation, initial encounter: Secondary | ICD-10-CM | POA: Diagnosis not present

## 2015-04-21 DIAGNOSIS — Z930 Tracheostomy status: Secondary | ICD-10-CM | POA: Diagnosis not present

## 2015-04-21 DIAGNOSIS — Z8521 Personal history of malignant neoplasm of larynx: Secondary | ICD-10-CM

## 2015-04-21 DIAGNOSIS — I2510 Atherosclerotic heart disease of native coronary artery without angina pectoris: Secondary | ICD-10-CM | POA: Diagnosis present

## 2015-04-21 DIAGNOSIS — Z85118 Personal history of other malignant neoplasm of bronchus and lung: Secondary | ICD-10-CM | POA: Diagnosis not present

## 2015-04-21 DIAGNOSIS — K2270 Barrett's esophagus without dysplasia: Secondary | ICD-10-CM | POA: Diagnosis not present

## 2015-04-21 DIAGNOSIS — M1990 Unspecified osteoarthritis, unspecified site: Secondary | ICD-10-CM | POA: Diagnosis present

## 2015-04-21 DIAGNOSIS — J9690 Respiratory failure, unspecified, unspecified whether with hypoxia or hypercapnia: Secondary | ICD-10-CM | POA: Diagnosis present

## 2015-04-21 DIAGNOSIS — J151 Pneumonia due to Pseudomonas: Principal | ICD-10-CM | POA: Diagnosis present

## 2015-04-21 DIAGNOSIS — J441 Chronic obstructive pulmonary disease with (acute) exacerbation: Secondary | ICD-10-CM | POA: Diagnosis present

## 2015-04-21 DIAGNOSIS — Z7982 Long term (current) use of aspirin: Secondary | ICD-10-CM

## 2015-04-21 DIAGNOSIS — G4730 Sleep apnea, unspecified: Secondary | ICD-10-CM | POA: Diagnosis not present

## 2015-04-21 DIAGNOSIS — J9621 Acute and chronic respiratory failure with hypoxia: Secondary | ICD-10-CM | POA: Diagnosis not present

## 2015-04-21 DIAGNOSIS — Z87442 Personal history of urinary calculi: Secondary | ICD-10-CM

## 2015-04-21 DIAGNOSIS — Z452 Encounter for adjustment and management of vascular access device: Secondary | ICD-10-CM

## 2015-04-21 DIAGNOSIS — J440 Chronic obstructive pulmonary disease with acute lower respiratory infection: Secondary | ICD-10-CM | POA: Diagnosis not present

## 2015-04-21 DIAGNOSIS — R918 Other nonspecific abnormal finding of lung field: Secondary | ICD-10-CM | POA: Diagnosis not present

## 2015-04-21 DIAGNOSIS — Z833 Family history of diabetes mellitus: Secondary | ICD-10-CM | POA: Diagnosis not present

## 2015-04-21 DIAGNOSIS — D638 Anemia in other chronic diseases classified elsewhere: Secondary | ICD-10-CM | POA: Diagnosis present

## 2015-04-21 DIAGNOSIS — Z8673 Personal history of transient ischemic attack (TIA), and cerebral infarction without residual deficits: Secondary | ICD-10-CM

## 2015-04-21 DIAGNOSIS — Z23 Encounter for immunization: Secondary | ICD-10-CM

## 2015-04-21 DIAGNOSIS — J984 Other disorders of lung: Secondary | ICD-10-CM | POA: Diagnosis not present

## 2015-04-21 DIAGNOSIS — B488 Other specified mycoses: Secondary | ICD-10-CM | POA: Diagnosis present

## 2015-04-21 DIAGNOSIS — F329 Major depressive disorder, single episode, unspecified: Secondary | ICD-10-CM | POA: Diagnosis not present

## 2015-04-21 DIAGNOSIS — K219 Gastro-esophageal reflux disease without esophagitis: Secondary | ICD-10-CM | POA: Diagnosis not present

## 2015-04-21 DIAGNOSIS — J9600 Acute respiratory failure, unspecified whether with hypoxia or hypercapnia: Secondary | ICD-10-CM | POA: Diagnosis present

## 2015-04-21 DIAGNOSIS — Z931 Gastrostomy status: Secondary | ICD-10-CM | POA: Diagnosis not present

## 2015-04-21 DIAGNOSIS — N400 Enlarged prostate without lower urinary tract symptoms: Secondary | ICD-10-CM | POA: Diagnosis not present

## 2015-04-21 DIAGNOSIS — Z87891 Personal history of nicotine dependence: Secondary | ICD-10-CM

## 2015-04-21 DIAGNOSIS — E43 Unspecified severe protein-calorie malnutrition: Secondary | ICD-10-CM | POA: Diagnosis not present

## 2015-04-21 DIAGNOSIS — J9601 Acute respiratory failure with hypoxia: Secondary | ICD-10-CM | POA: Diagnosis not present

## 2015-04-21 HISTORY — DX: Anemia, unspecified: D64.9

## 2015-04-21 LAB — CBC WITH DIFFERENTIAL/PLATELET
Basophils Absolute: 0.1 10*3/uL (ref 0–0.1)
Basophils Relative: 0 %
EOS ABS: 0.1 10*3/uL (ref 0–0.7)
Eosinophils Relative: 1 %
HCT: 27.9 % — ABNORMAL LOW (ref 40.0–52.0)
HEMOGLOBIN: 8.2 g/dL — AB (ref 13.0–18.0)
LYMPHS ABS: 0.6 10*3/uL — AB (ref 1.0–3.6)
MCH: 20.2 pg — AB (ref 26.0–34.0)
MCHC: 29.2 g/dL — ABNORMAL LOW (ref 32.0–36.0)
MCV: 69.2 fL — ABNORMAL LOW (ref 80.0–100.0)
Monocytes Absolute: 2 10*3/uL — ABNORMAL HIGH (ref 0.2–1.0)
Monocytes Relative: 15 %
Neutro Abs: 11.1 10*3/uL — ABNORMAL HIGH (ref 1.4–6.5)
Platelets: 240 10*3/uL (ref 150–440)
RBC: 4.03 MIL/uL — AB (ref 4.40–5.90)
RDW: 19 % — ABNORMAL HIGH (ref 11.5–14.5)
WBC: 13.9 10*3/uL — AB (ref 3.8–10.6)

## 2015-04-21 LAB — BASIC METABOLIC PANEL
ANION GAP: 9 (ref 5–15)
BUN: 24 mg/dL — ABNORMAL HIGH (ref 6–20)
CO2: 31 mmol/L (ref 22–32)
Calcium: 8.6 mg/dL — ABNORMAL LOW (ref 8.9–10.3)
Chloride: 93 mmol/L — ABNORMAL LOW (ref 101–111)
Creatinine, Ser: 0.68 mg/dL (ref 0.61–1.24)
GLUCOSE: 183 mg/dL — AB (ref 65–99)
POTASSIUM: 4.7 mmol/L (ref 3.5–5.1)
SODIUM: 133 mmol/L — AB (ref 135–145)

## 2015-04-21 LAB — TROPONIN I

## 2015-04-21 MED ORDER — FOLIC ACID 1 MG PO TABS
1.0000 mg | ORAL_TABLET | Freq: Every day | ORAL | Status: DC
  Administered 2015-04-21 – 2015-04-27 (×7): 1 mg
  Filled 2015-04-21 (×7): qty 1

## 2015-04-21 MED ORDER — PIPERACILLIN-TAZOBACTAM 3.375 G IVPB 30 MIN
3.3750 g | Freq: Once | INTRAVENOUS | Status: AC
  Administered 2015-04-21: 3.375 g via INTRAVENOUS
  Filled 2015-04-21: qty 50

## 2015-04-21 MED ORDER — ZOLPIDEM TARTRATE 5 MG PO TABS: 5.0000 mg | ORAL_TABLET | Freq: Every evening | ORAL | Status: DC | PRN

## 2015-04-21 MED ORDER — IOHEXOL 300 MG/ML  SOLN
75.0000 mL | Freq: Once | INTRAMUSCULAR | Status: AC | PRN
  Administered 2015-04-21: 75 mL via INTRAVENOUS

## 2015-04-21 MED ORDER — IPRATROPIUM-ALBUTEROL 0.5-2.5 (3) MG/3ML IN SOLN: 3.0000 mL | Freq: Four times a day (QID) | RESPIRATORY_TRACT | Status: DC

## 2015-04-21 MED ORDER — ASPIRIN EC 81 MG PO TBEC
81.0000 mg | DELAYED_RELEASE_TABLET | Freq: Every day | ORAL | Status: DC
  Administered 2015-04-21 – 2015-04-27 (×7): 81 mg via ORAL
  Filled 2015-04-21 (×7): qty 1

## 2015-04-21 MED ORDER — GUAIFENESIN 100 MG/5ML PO SOLN
400.0000 mg | ORAL | Status: DC | PRN
  Administered 2015-04-22: 400 mg via ORAL
  Filled 2015-04-21: qty 20

## 2015-04-21 MED ORDER — METHYLPREDNISOLONE SODIUM SUCC 125 MG IJ SOLR
125.0000 mg | Freq: Once | INTRAMUSCULAR | Status: AC
  Administered 2015-04-21: 125 mg via INTRAVENOUS
  Filled 2015-04-21: qty 2

## 2015-04-21 MED ORDER — ADULT MULTIVITAMIN W/MINERALS CH
1.0000 | ORAL_TABLET | Freq: Every day | ORAL | Status: DC
  Administered 2015-04-21 – 2015-04-27 (×7): 1 via ORAL
  Filled 2015-04-21 (×7): qty 1

## 2015-04-21 MED ORDER — IPRATROPIUM-ALBUTEROL 0.5-2.5 (3) MG/3ML IN SOLN
9.0000 mL | Freq: Once | RESPIRATORY_TRACT | Status: AC
  Administered 2015-04-21: 9 mL via RESPIRATORY_TRACT
  Filled 2015-04-21: qty 9

## 2015-04-21 MED ORDER — ALBUTEROL SULFATE (2.5 MG/3ML) 0.083% IN NEBU: 2.5000 mg | INHALATION_SOLUTION | Freq: Once | RESPIRATORY_TRACT | Status: DC

## 2015-04-21 MED ORDER — ZOLPIDEM TARTRATE 5 MG PO TABS
5.0000 mg | ORAL_TABLET | Freq: Every evening | ORAL | Status: DC | PRN
  Administered 2015-04-21 – 2015-04-26 (×5): 5 mg via ORAL
  Filled 2015-04-21 (×5): qty 1

## 2015-04-21 MED ORDER — LORAZEPAM 0.5 MG PO TABS
0.5000 mg | ORAL_TABLET | Freq: Four times a day (QID) | ORAL | Status: DC | PRN
  Administered 2015-04-23 – 2015-04-25 (×2): 0.5 mg
  Filled 2015-04-21 (×2): qty 1

## 2015-04-21 MED ORDER — LORATADINE 10 MG PO TABS
10.0000 mg | ORAL_TABLET | Freq: Every day | ORAL | Status: DC
  Administered 2015-04-21 – 2015-04-27 (×7): 10 mg
  Filled 2015-04-21 (×7): qty 1

## 2015-04-21 MED ORDER — POLYETHYLENE GLYCOL 3350 17 G PO PACK: 17.0000 g | PACK | Freq: Every day | ORAL | Status: DC | PRN

## 2015-04-21 MED ORDER — IPRATROPIUM-ALBUTEROL 0.5-2.5 (3) MG/3ML IN SOLN
3.0000 mL | RESPIRATORY_TRACT | Status: DC
  Administered 2015-04-22 – 2015-04-24 (×19): 3 mL via RESPIRATORY_TRACT
  Filled 2015-04-21 (×19): qty 3

## 2015-04-21 MED ORDER — ONDANSETRON HCL 4 MG PO TABS: 4.0000 mg | ORAL_TABLET | Freq: Four times a day (QID) | ORAL | Status: DC | PRN

## 2015-04-21 MED ORDER — MONTELUKAST SODIUM 10 MG PO TABS
10.0000 mg | ORAL_TABLET | Freq: Every day | ORAL | Status: DC
Start: 2015-04-21 — End: 2015-04-25
  Administered 2015-04-21 – 2015-04-24 (×4): 10 mg
  Filled 2015-04-21 (×4): qty 1

## 2015-04-21 MED ORDER — METHYLPREDNISOLONE SODIUM SUCC 40 MG IJ SOLR
40.0000 mg | Freq: Three times a day (TID) | INTRAMUSCULAR | Status: DC
  Administered 2015-04-22 – 2015-04-23 (×3): 40 mg via INTRAVENOUS
  Filled 2015-04-21 (×4): qty 1

## 2015-04-21 MED ORDER — PIPERACILLIN-TAZOBACTAM 3.375 G IVPB
3.3750 g | Freq: Three times a day (TID) | INTRAVENOUS | Status: DC
  Administered 2015-04-22 – 2015-04-23 (×4): 3.375 g via INTRAVENOUS
  Filled 2015-04-21 (×6): qty 50

## 2015-04-21 MED ORDER — CITALOPRAM HYDROBROMIDE 20 MG PO TABS
20.0000 mg | ORAL_TABLET | Freq: Every day | ORAL | Status: DC
Start: 2015-04-21 — End: 2015-04-27
  Administered 2015-04-21 – 2015-04-27 (×7): 20 mg
  Filled 2015-04-21 (×7): qty 1

## 2015-04-21 MED ORDER — ACETAMINOPHEN 650 MG RE SUPP: 650.0000 mg | Freq: Four times a day (QID) | RECTAL | Status: DC | PRN

## 2015-04-21 MED ORDER — VANCOMYCIN HCL IN DEXTROSE 1-5 GM/200ML-% IV SOLN
1000.0000 mg | Freq: Two times a day (BID) | INTRAVENOUS | Status: DC
  Administered 2015-04-22 – 2015-04-23 (×4): 1000 mg via INTRAVENOUS
  Filled 2015-04-21 (×6): qty 200

## 2015-04-21 MED ORDER — FERROUS SULFATE 325 (65 FE) MG PO TABS
325.0000 mg | ORAL_TABLET | Freq: Every day | ORAL | Status: DC
  Administered 2015-04-21 – 2015-04-27 (×7): 325 mg via ORAL
  Filled 2015-04-21 (×8): qty 1

## 2015-04-21 MED ORDER — SODIUM CHLORIDE 0.9 % IV SOLN
INTRAVENOUS | Status: DC
  Administered 2015-04-21: 22:00:00 via INTRAVENOUS

## 2015-04-21 MED ORDER — SODIUM CHLORIDE 0.9 % IJ SOLN
3.0000 mL | Freq: Two times a day (BID) | INTRAMUSCULAR | Status: DC
  Administered 2015-04-22 – 2015-04-26 (×8): 3 mL via INTRAVENOUS

## 2015-04-21 MED ORDER — PANTOPRAZOLE SODIUM 40 MG PO TBEC
40.0000 mg | DELAYED_RELEASE_TABLET | Freq: Two times a day (BID) | ORAL | Status: DC
  Administered 2015-04-22: 40 mg via ORAL
  Filled 2015-04-21 (×3): qty 1

## 2015-04-21 MED ORDER — ENOXAPARIN SODIUM 40 MG/0.4ML ~~LOC~~ SOLN
40.0000 mg | SUBCUTANEOUS | Status: DC
  Administered 2015-04-21 – 2015-04-26 (×6): 40 mg via SUBCUTANEOUS
  Filled 2015-04-21 (×6): qty 0.4

## 2015-04-21 MED ORDER — TAMSULOSIN HCL 0.4 MG PO CAPS
0.4000 mg | ORAL_CAPSULE | Freq: Every day | ORAL | Status: DC
  Administered 2015-04-21 – 2015-04-27 (×7): 0.4 mg via ORAL
  Filled 2015-04-21 (×6): qty 1

## 2015-04-21 MED ORDER — PIPERACILLIN-TAZOBACTAM 3.375 G IVPB 30 MIN
3.3750 g | Freq: Three times a day (TID) | INTRAVENOUS | Status: DC
  Filled 2015-04-21: qty 50

## 2015-04-21 MED ORDER — ACETAMINOPHEN 325 MG PO TABS
650.0000 mg | ORAL_TABLET | Freq: Four times a day (QID) | ORAL | Status: DC | PRN
Start: 2015-04-21 — End: 2015-04-27

## 2015-04-21 MED ORDER — ONDANSETRON HCL 4 MG/2ML IJ SOLN: 4.0000 mg | Freq: Four times a day (QID) | INTRAMUSCULAR | Status: DC | PRN

## 2015-04-21 MED ORDER — PIPERACILLIN-TAZOBACTAM 3.375 G IVPB
3.3750 g | Freq: Three times a day (TID) | INTRAVENOUS | Status: DC
  Filled 2015-04-21 (×2): qty 50

## 2015-04-21 MED ORDER — VANCOMYCIN HCL IN DEXTROSE 1-5 GM/200ML-% IV SOLN
1000.0000 mg | Freq: Once | INTRAVENOUS | Status: AC
  Administered 2015-04-21: 1000 mg via INTRAVENOUS
  Filled 2015-04-21: qty 200

## 2015-04-21 MED ORDER — ACETYLCYSTEINE 20 % IN SOLN
600.0000 mg | Freq: Two times a day (BID) | RESPIRATORY_TRACT | Status: DC
  Administered 2015-04-22: 600 mg via ORAL
  Filled 2015-04-21: qty 4

## 2015-04-21 MED ORDER — ALBUTEROL SULFATE (2.5 MG/3ML) 0.083% IN NEBU
INHALATION_SOLUTION | RESPIRATORY_TRACT | Status: AC
  Filled 2015-04-21: qty 3

## 2015-04-21 MED ORDER — VANCOMYCIN HCL IN DEXTROSE 1-5 GM/200ML-% IV SOLN
1000.0000 mg | Freq: Two times a day (BID) | INTRAVENOUS | Status: DC
Start: 2015-04-22 — End: 2015-04-21
  Filled 2015-04-21 (×2): qty 200

## 2015-04-21 NOTE — Progress Notes (Signed)
ANTIBIOTIC CONSULT NOTE - INITIAL  Pharmacy Consult for vancomycin and piperacillin/tazobactam Indication: pneumonia  Allergies  Allergen Reactions  . Morphine And Related Other (See Comments)    Reaction:  Altered mental status    Patient Measurements: Height: 5\' 11"  (180.3 cm) Weight: 173 lb 12.8 oz (78.835 kg) IBW/kg (Calculated) : 75.3  Vital Signs: Temp: 98.2 F (36.8 C) (10/25 1734) Temp Source: Oral (10/25 1734) BP: 112/54 mmHg (10/25 1800) Pulse Rate: 76 (10/25 1800) Intake/Output from previous day:   Intake/Output from this shift:    Labs:  Recent Labs  04/21/15 1742  WBC 13.9*  HGB 8.2*  PLT 240  CREATININE 0.68   Estimated Creatinine Clearance: 77.1 mL/min (by C-G formula based on Cr of 0.68). No results for input(s): VANCOTROUGH, VANCOPEAK, VANCORANDOM, GENTTROUGH, GENTPEAK, GENTRANDOM, TOBRATROUGH, TOBRAPEAK, TOBRARND, AMIKACINPEAK, AMIKACINTROU, AMIKACIN in the last 72 hours.   Microbiology: Recent Results (from the past 720 hour(s))  Culture, blood (routine x 2)     Status: None   Collection Time: 04/06/15  4:11 PM  Result Value Ref Range Status   Specimen Description BLOOD RIGHT ANTECUBITAL  Final   Special Requests BOTTLES DRAWN AEROBIC AND ANAEROBIC 10ML  Final   Culture NO GROWTH 5 DAYS  Final   Report Status 04/11/2015 FINAL  Final  Culture, blood (routine x 2)     Status: None   Collection Time: 04/06/15  4:11 PM  Result Value Ref Range Status   Specimen Description BLOOD LEFT  Final   Special Requests BOTTLES DRAWN AEROBIC AND ANAEROBIC 20ML  Final   Culture NO GROWTH 5 DAYS  Final   Report Status 04/11/2015 FINAL  Final  Culture, expectorated sputum-assessment     Status: None   Collection Time: 04/07/15  6:25 AM  Result Value Ref Range Status   Specimen Description SPUTUM  Final   Special Requests Immunocompromised  Final   Sputum evaluation THIS SPECIMEN IS ACCEPTABLE FOR SPUTUM CULTURE  Final   Report Status 04/07/2015 FINAL   Final  Culture, respiratory (NON-Expectorated)     Status: None   Collection Time: 04/07/15  6:25 AM  Result Value Ref Range Status   Specimen Description SPUTUM  Final   Special Requests Immunocompromised Reflexed from [id number]  Final   Gram Stain   Final    MODERATE WBC SEEN MANY YEAST MANY GRAM POSITIVE RODS GOOD SPECIMEN - 80-90% WBCS    Culture   Final    HEAVY GROWTH CANDIDA TROPICALIS MODERATE GROWTH CANDIDA ALBICANS RARE ACINETOBACTER LWOFFII    Report Status 04/12/2015 FINAL  Final   Organism ID, Bacteria ACINETOBACTER LWOFFII  Final      Susceptibility   Acinetobacter lwoffii - MIC*    CEFTRIAXONE Value in next row Intermediate      INTERMEDIATE16    CIPROFLOXACIN Value in next row Resistant      RESISTANT>=4    GENTAMICIN Value in next row Sensitive      SENSITIVE<=1    IMIPENEM Value in next row Sensitive      SENSITIVE<=0.25    PIP/TAZO Value in next row Resistant      RESISTANT>=128    TRIMETH/SULFA Value in next row Sensitive      SENSITIVE<=20    CEFEPIME Value in next row Sensitive      SENSITIVE2    AMPICILLIN/SULBACTAM Value in next row Sensitive      SENSITIVE<=2    * RARE ACINETOBACTER LWOFFII    Medical History: Past Medical History  Diagnosis Date  .  Cancer ([place])   . COPD (chronic obstructive pulmonary disease) ([place])   . Coronary artery disease   . Stroke ([place])   . Depression   . Sleep apnea   . MRSA pneumonia ([place])   . Barrett's esophagus   . BPH (benign prostatic hyperplasia)   . Nephrolithiasis   . Osteoarthritis     Medications:  Anti-infectives    Start     Dose/Rate Route Frequency Ordered Stop   04/22/15 0100  vancomycin (VANCOCIN) IVPB 1000 mg/200 mL premix     1,000 mg 200 mL/hr over 60 Minutes Intravenous Every 12 hours 04/21/15 1856     04/21/15 2200  piperacillin-tazobactam (ZOSYN) IVPB 3.375 g     3.375 g 100 mL/hr over 30 Minutes Intravenous 3 times per day 04/21/15 1848     04/21/15 1900  vancomycin (VANCOCIN) IVPB  1000 mg/200 mL premix     1,000 mg 200 mL/hr over 60 Minutes Intravenous  Once 04/21/15 1848       Assessment: Pharmacy consulted to dose piperacillin/tazobactam and vancomycin in this 81 year old male presenting with pneumonia.   Kinetics: Using actual body weight of 79 kg CrCl ~80 mL/min Ke=0.071 Half-life: 9.7 hours Vd=55L Cmin=15  Goal of Therapy:  Vancomycin trough level 15-20 mcg/ml  Plan:  Expected duration 7 days with resolution of temperature and/or normalization of WBC Measure antibiotic drug levels at steady state Follow up culture results   Vancomycin 1g dose given in ED.  Vancomycin maintenance dose of 1g q12h (with stacked dose starting 6 hours after initial dose) Vancomycin trough 10/27 @ 12:00  Piperacillin/tazobactam 3.375 g IV q8h EI  Pharmacy will continue to follow renal function and vancomycin levels and make dose adjustments as needed. Thank you for the consult.  Darylene Price Jovontae Banko 04/21/2015,6:58 PM

## 2015-04-21 NOTE — ED Notes (Signed)
RT suctioned pt trach, pt placed on trach collar, pt also has g-tube in place

## 2015-04-21 NOTE — H&P (Signed)
[place] [facility] [place] NAME: Latham Kinzler    MR#:  [id number]  DATE OF BIRTH:  12/11/1931  DATE OF ADMISSION:  04/21/2015  PRIMARY CARE PHYSICIAN: Lavera Guise, MD   REQUESTING/REFERRING PHYSICIAN: Dr. Larae Grooms  CHIEF COMPLAINT:   Chief Complaint  Patient presents with  . Shortness of Breath    HISTORY OF PRESENT ILLNESS:  Satoshi Kalas  is a 81 y.o. male with a known history of chronic respiratory failure on trach collar, COPD, history of laryngeal cancer, prostate cancer currently on injections presents to the hospital from home secondary to respiratory distress and difficulty breathing. Patient has a tracheostomy, on oxygen at this time and is unable to speak. His wife at bedside provides most of the history. According to the wife, patient had laryngeal cancer history and had radiation and tracheostomy done about 15 years ago. He was doing fine at home, on room air, even doing household activities up until 3 weeks ago. He was admitted to the hospital at that time for pneumonia. He was in the hospital and got treated with IV antibiotics and was discharged about 2 weeks ago on oral Augmentin. He was requiring trach collar oxygen support at the time of discharge. For the first couple of days after discharge patient was doing fine. Over the last week he was having increasingly thick mucus, wife had to suction him more often. He was noticed to have some chills yesterday, nauseated all day yesterday. This morning the suction machine at home was not able to suction the thick secretions, he was getting increasingly tachypneic and so was brought to the hospital. Chest x-ray shows worsening of his right lower lobe pneumonia.  PAST MEDICAL HISTORY:   Past Medical History  Diagnosis Date  . Cancer (HCC)     laryngeal cancer, prostrate cancer  . COPD (chronic obstructive pulmonary disease) ([place])   . Coronary artery disease   . Stroke  [facility])     no residual weakness  . Depression   . Sleep apnea   . MRSA pneumonia ([place])   . Barrett's esophagus   . BPH (benign prostatic hyperplasia)   . Nephrolithiasis   . Osteoarthritis   . Anemia     PAST SURGICAL HISTORY:   Past Surgical History  Procedure Laterality Date  . Tracheostomy N/A   . Gastrostomy w/ feeding tube N/A   . Laminectomy    . Rotator cuff repair    . Lung removal, partial    . Esophageogastrectomy    . Peg tube placement    . Renal calculi    . Ureteral stent placement    . Flexible bronchoscopy N/A 12/24/2014    Procedure: FLEXIBLE BRONCHOSCOPY;  Surgeon: Allyne Gee, MD;  Location: [facility] ORS;  Service: Pulmonary;  Laterality: N/A;    SOCIAL HISTORY:   Social History  Substance Use Topics  . Smoking status: Former Smoker -- 2.00 packs/day for 30 years    Types: Cigarettes    Quit date: 12/23/1984  . Smokeless tobacco: Not on file  . Alcohol Use: No    FAMILY HISTORY:   Family History  Problem Relation Age of Onset  . Diabetes Brother     DRUG ALLERGIES:   Allergies  Allergen Reactions  . Morphine And Related Other (See Comments)    Reaction:  Altered mental status    REVIEW OF SYSTEMS:   Review of Systems  Unable to perform ROS: critical illness  MEDICATIONS AT HOME:   Prior to Admission medications   Medication Sig Start Date End Date Taking? Authorizing Provider  aspirin EC 81 MG tablet 81 mg by PEG Tube route daily.    Yes Historical Provider, MD  citalopram (CELEXA) 20 MG tablet 20 mg by PEG Tube route daily.    Yes Historical Provider, MD  EPINEPHrine (EPIPEN 2-PAK) 0.3 mg/0.3 mL IJ SOAJ injection Inject 0.3 mg into the muscle once as needed (for severe allergic reaction).   Yes Historical Provider, MD  Fe Fum-FA-B Cmp-C-Zn-Mg-Mn-Cu (CENTRATEX) 106-1 MG CAPS 1 capsule by PEG Tube route daily.    Yes Historical Provider, MD  ferrous sulfate 325 (65 FE) MG EC tablet 325 mg by PEG Tube route daily.    Yes Historical  Provider, MD  folic acid (FOLVITE) 1 MG tablet 1 mg by PEG Tube route daily.    Yes Historical Provider, MD  guaiFENesin (MUCINEX) 600 MG 12 hr tablet Take 600 mg by mouth 2 (two) times daily.   Yes Historical Provider, MD  guaifenesin (ROBITUSSIN) 100 MG/5ML syrup Take 400 mg by mouth every 4 (four) hours as needed for cough.   Yes Historical Provider, MD  HYDROcodone-acetaminophen (NORCO/VICODIN) 5-325 MG per tablet 1 tablet by PEG Tube route 3 (three) times daily as needed for moderate pain.    Yes Historical Provider, MD  ipratropium-albuterol (DUONEB) 0.5-2.5 (3) MG/3ML SOLN Take 3 mLs by nebulization every 6 (six) hours as needed (for shortness of breath).   Yes Historical Provider, MD  loratadine (CLARITIN) 10 MG tablet 10 mg by PEG Tube route daily.    Yes Historical Provider, MD  LORazepam (ATIVAN) 0.5 MG tablet 0.5 mg by PEG Tube route 4 (four) times daily as needed for anxiety.    Yes Historical Provider, MD  montelukast (SINGULAIR) 10 MG tablet 10 mg by PEG Tube route at bedtime.    Yes Historical Provider, MD  Multiple Vitamin (MULTIVITAMIN WITH MINERALS) TABS tablet 1 tablet by PEG Tube route daily.    Yes Historical Provider, MD  pantoprazole (PROTONIX) 40 MG tablet 40 mg by PEG Tube route 2 (two) times daily.    Yes Historical Provider, MD  sulfamethoxazole-trimethoprim (BACTRIM DS,SEPTRA DS) 800-160 MG tablet 1 tablet by PEG Tube route 2 (two) times daily.  04/21/15 05/01/15 Yes Historical Provider, MD  tamsulosin (FLOMAX) 0.4 MG CAPS capsule 0.4 mg by PEG Tube route daily.    Yes Historical Provider, MD  zolpidem (AMBIEN) 5 MG tablet 5 mg by PEG Tube route at bedtime as needed for sleep.    Yes Historical Provider, MD  amoxicillin-clavulanate (AUGMENTIN) 500-125 MG tablet Take 1 tablet (500 mg total) by mouth 2 (two) times daily. Patient not taking: Reported on 04/21/2015 04/09/15   Henreitta Leber, MD  furosemide (LASIX) 20 MG tablet Take 1 tablet (20 mg total) by mouth  daily. Patient not taking: Reported on 04/06/2015 11/06/14 11/06/15  Lisa Roca, MD  predniSONE (DELTASONE) 10 MG tablet Take 1 tablet (10 mg total) by mouth daily with breakfast. Patient not taking: Reported on 04/21/2015 04/09/15   Henreitta Leber, MD      VITAL SIGNS:  Blood pressure 112/54, pulse 76, temperature 98.2 F (36.8 C), temperature source Oral, resp. rate 27, height 5\' 11"  (1.803 m), weight 78.835 kg (173 lb 12.8 oz), SpO2 100 %.  PHYSICAL EXAMINATION:   Physical Exam  GENERAL:  81 y.o.-year-old patient lying in the bed and appears to be in acute respiratory distress with tachypnea.  EYES: Pupils equal, round, reactive to light and accommodation. No scleral icterus. Extraocular muscles intact.  HEENT: Head atraumatic, normocephalic. Oropharynx and nasopharynx clear. Some thick mucus phlegm noted on palate NECK:  Supple, no jugular venous distention. No thyroid enlargement, no tenderness.  LUNGS: coarse rhonchi bilaterally worse in the bases, scattered wheezing noted, no rales Noted patient using accessory muscles with minimal movement  CARDIOVASCULAR: S1, S2 normal. No rubs, or gallops. Loud 3/6 systolic murmur present ABDOMEN: Soft, nontender, nondistended. Bowel sounds present. No organomegaly or mass.  EXTREMITIES: No pedal edema, cyanosis, or clubbing.  NEUROLOGIC: no facial droop, able to move all 4 extremities, following commands, 5/5 strength noted, sensation intact. Unable to speak due to trach  PSYCHIATRIC: The patient is alert and oriented x 3.  SKIN: No obvious rash, lesion, or ulcer.   LABORATORY PANEL:   CBC  Recent Labs Lab 04/21/15 1742  WBC 13.9*  HGB 8.2*  HCT 27.9*  PLT 240   ------------------------------------------------------------------------------------------------------------------  Chemistries   Recent Labs Lab 04/21/15 1742  NA 133*  K 4.7  CL 93*  CO2 31  GLUCOSE 183*  BUN 24*  CREATININE 0.68  CALCIUM 8.6*    ------------------------------------------------------------------------------------------------------------------  Cardiac Enzymes  Recent Labs Lab 04/21/15 1742  TROPONINI <0.03   ------------------------------------------------------------------------------------------------------------------  RADIOLOGY:  Dg Chest 1 View  04/21/2015  CLINICAL DATA:  increased SOB and thick secretions in trach that wife was unable to suction. Pt states what has come out was thick and yellow. Pt is normally on room air EXAM: CHEST  1 VIEW COMPARISON:  04/06/2015 FINDINGS: Tracheostomy remains in place. Low lung volumes with crowding of bronchovascular structures particularly around the hila. Masslike opacity in the left lower lung as before. Right infrahilar airspace opacity somewhat less well demarcated than on previous exam. Airspace opacities extend laterally at the right lung base. Vascular clips overlie the right hilum. Heart size upper limits normal for technique. Atheromatous aorta. No pneumothorax. Blunting of the left lateral costophrenic angle suggesting small effusion. Visualized skeletal structures are unremarkable. IMPRESSION: 1. Progressive right lower lung airspace disease possibly pneumonia. 2. Persistent left lower lobe lung masslike opacity. CT may be useful to differentiate pneumonia from neoplasm. Electronically Signed   By: Lucrezia Europe M.D.   On: 04/21/2015 18:08    EKG:   Orders placed or performed during the hospital encounter of 04/06/15  . ED EKG  . ED EKG    IMPRESSION AND PLAN:   Fabyan Loughmiller  is a 81 y.o. male with a known history of chronic respiratory failure on trach collar, COPD, history of laryngeal cancer, prostate cancer currently on injections presents to the hospital from home secondary to respiratory distress and difficulty breathing.  #1 Acute respiratory failure- secondary to worsening pneumonia-healthcare acquired pneumonia -Patient has chronic tracheostomy,  just started on trach collar 2 weeks ago at the time of discharge. - Blood Cultures ordered. Chest x-ray with worsening right lower lobe pneumonia and also chronic changes and left lower lobe. -CT of the chest ordered. -Intake increased mucus secretions noted, frequent suctioning advice. Mucomyst solution inhalation twice a day. -Pulmonary consults. Continues to worse or increased secretions, flexible bronchoscopy can be considered - Monitor in ICU due to his tenuous respiratory status. If condition worsens, will have to change him to a ventilator  #2 COPD-with acute exacerbation, significant wheezing on exam. -Duo-nebs, Solu-Medrol IV at this time.  #3 prostate cancer-following with urologist. Has adenocarcinoma of the prostate -Received six-month Lupron injection in August 2016 -Continue  outpatient follow-up  #4 Barrett's esophagus-continue outpatient monitoring. -On Protonix twice a day  #5 DVT prophylaxis-on Lovenox.  #6 depression-continue Celexa   All the records are reviewed and case discussed with ED provider. Management plans discussed with the patient, family and they are in agreement.  CODE STATUS: Full Code  TOTAL CRITICAL CARE TIME SPENT IN TAKING CARE OF THIS PATIENT: 65 minutes.    Cyncere Sontag M.D on 04/21/2015 at 7:57 PM  Between 7am to 6pm - Pager - [phone]  After 6pm go to [email or web address] - password [email or web address] [facility]  Office  [phone]  CC: Primary care physician; Lavera Guise, MD

## 2015-04-21 NOTE — ED Provider Notes (Addendum)
[facility] Emergency Department Provider Note  ____________________________________________  Time seen: Seen upon arrival to the emergency department  I have reviewed the triage vital signs and the nursing notes.   HISTORY  Chief Complaint Shortness of Breath    HPI Stephen Taylor is a 81 y.o. male with a history of laryngeal cancer status post ostomy who was recently hospitalized for pneumonia who is presenting today with worsening shortness of breath and thickening secretions from his trach. The patient denies any chest pain. Said that the symptoms worsened acutely today. However, he says that he never felt completely better after being discharged from his pneumonia. Denies any bloody discharge from his trach. No vomiting. Also noted that the patient has increasing swelling to his left foot. Per EMS, the patient desats to 88% on room air. He is usually on room air throughout his trach.    Past Medical History  Diagnosis Date  . Cancer ([place])   . COPD (chronic obstructive pulmonary disease) ([place])   . Coronary artery disease   . Stroke ([place])   . Depression   . Sleep apnea   . MRSA pneumonia ([place])   . Barrett's esophagus   . BPH (benign prostatic hyperplasia)   . Nephrolithiasis   . Osteoarthritis     Patient Active Problem List   Diagnosis Date Noted  . Pneumonia 04/06/2015    Past Surgical History  Procedure Laterality Date  . Tracheostomy N/A   . Gastrostomy w/ feeding tube N/A   . Laminectomy    . Rotator cuff repair    . Lung removal, partial    . Esophageogastrectomy    . Peg tube placement    . Renal calculi    . Ureteral stent placement    . Flexible bronchoscopy N/A 12/24/2014    Procedure: FLEXIBLE BRONCHOSCOPY;  Surgeon: Allyne Gee, MD;  Location: [facility] ORS;  Service: Pulmonary;  Laterality: N/A;    Current Outpatient Rx  Name  Route  Sig  Dispense  Refill  . amoxicillin-clavulanate (AUGMENTIN) 500-125 MG tablet   Oral  Take 1 tablet (500 mg total) by mouth 2 (two) times daily.   20 tablet   0   . aspirin EC 81 MG tablet   PEG Tube   81 mg by PEG Tube route daily.          . citalopram (CELEXA) 20 MG tablet   PEG Tube   20 mg by PEG Tube route daily.          Marland Kitchen EPINEPHrine (EPIPEN 2-PAK) 0.3 mg/0.3 mL IJ SOAJ injection   Intramuscular   Inject 0.3 mg into the muscle once as needed (for severe allergic reaction).         . Fe Fum-FA-B Cmp-C-Zn-Mg-Mn-Cu (CENTRATEX) 106-1 MG CAPS   PEG Tube   1 capsule by PEG Tube route daily.          . ferrous sulfate 325 (65 FE) MG EC tablet   PEG Tube   325 mg by PEG Tube route daily.          . folic acid (FOLVITE) 1 MG tablet   PEG Tube   1 mg by PEG Tube route daily.          . furosemide (LASIX) 20 MG tablet   Oral   Take 1 tablet (20 mg total) by mouth daily. Patient not taking: Reported on 04/06/2015   2 tablet   0   . HYDROcodone-acetaminophen (NORCO/VICODIN) 5-325  MG per tablet   PEG Tube   1 tablet by PEG Tube route 3 (three) times daily as needed for moderate pain.          Marland Kitchen ipratropium-albuterol (DUONEB) 0.5-2.5 (3) MG/3ML SOLN   Nebulization   Take 3 mLs by nebulization every 6 (six) hours as needed (for shortness of breath).         . loratadine (CLARITIN) 10 MG tablet   PEG Tube   10 mg by PEG Tube route daily.          Marland Kitchen LORazepam (ATIVAN) 0.5 MG tablet   PEG Tube   0.5 mg by PEG Tube route 4 (four) times daily as needed for anxiety.          . montelukast (SINGULAIR) 10 MG tablet   PEG Tube   10 mg by PEG Tube route at bedtime.          . Multiple Vitamin (MULTIVITAMIN WITH MINERALS) TABS tablet   PEG Tube   1 tablet by PEG Tube route daily.          . pantoprazole (PROTONIX) 40 MG tablet   PEG Tube   40 mg by PEG Tube route 2 (two) times daily.          . predniSONE (DELTASONE) 10 MG tablet   Oral   Take 1 tablet (10 mg total) by mouth daily with breakfast.   30 tablet   0     Label  &  dispense according to the schedule below. ...   . pseudoephedrine-guaifenesin (MUCINEX D) 60-600 MG per tablet   PEG Tube   1 tablet by PEG Tube route 2 (two) times daily as needed for congestion.          . tamsulosin (FLOMAX) 0.4 MG CAPS capsule   PEG Tube   0.4 mg by PEG Tube route daily.          Marland Kitchen zolpidem (AMBIEN) 5 MG tablet   PEG Tube   5 mg by PEG Tube route at bedtime as needed for sleep.            Allergies Morphine and related  Family History  Problem Relation Age of Onset  . Diabetes Brother     Social History Social History  Substance Use Topics  . Smoking status: Former Smoker -- 2.00 packs/day for 30 years    Types: Cigarettes    Quit date: 12/23/1984  . Smokeless tobacco: None  . Alcohol Use: No    Review of Systems Constitutional: No fever/chills Eyes: No visual changes. ENT: No sore throat. Cardiovascular: Denies chest pain. Respiratory: As above  Gastrointestinal: No abdominal pain.  No nausea, no vomiting.  No diarrhea.  No constipation. Genitourinary: Negative for dysuria. Musculoskeletal: Negative for back pain. Skin: Negative for rash. Neurological: Negative for headaches, focal weakness or numbness.  10-point ROS otherwise negative.  ____________________________________________   PHYSICAL EXAM:  VITAL SIGNS: ED Triage Vitals  Enc Vitals Group     BP 04/21/15 1734 131/54 mmHg     Pulse Rate 04/21/15 1734 82     Resp 04/21/15 1734 29     Temp 04/21/15 1734 98.2 F (36.8 C)     Temp Source 04/21/15 1734 Oral     SpO2 04/21/15 1727 97 %     Weight 04/21/15 1734 173 lb 12.8 oz (78.835 kg)     Height 04/21/15 1734 5\' 11"  (1.803 m)     Head Cir --  Peak Flow --      Pain Score 04/21/15 1736 0     Pain Loc --      Pain Edu? --      Excl. in [place]? --     Constitutional: Alert and oriented. Well appearing and in no acute distress. Eyes: Conjunctivae are normal. PERRL. EOMI. Head: Atraumatic. Nose: No  congestion/rhinnorhea. Mouth/Throat: Mucous membranes are moist.  Oropharynx non-erythematous. Neck: No stridor.  Tracheostomy without any visualized secretions. Cardiovascular: Normal rate, regular rhythm. Grossly normal heart sounds.  Good peripheral circulation. Respiratory: Tachypneic with wheezing and decreased air movement throughout.  Gastrointestinal: Soft and nontender. No distention. No abdominal bruits. No CVA tenderness. Musculoskeletal: Bilateral lower extremity edema to the ankles. There is no pain or erythema. There are dorsalis pedis pulses which are present and equal bilaterally. The edema is slightly greater on the left foot. No joint effusions. Neurologic:  Normal speech and language. No gross focal neurologic deficits are appreciated. No gait instability. Skin:  Skin is warm, dry and intact. No rash noted. Psychiatric: Mood and affect are normal. Speech and behavior are normal.  ____________________________________________   LABS (all labs ordered are listed, but only abnormal results are displayed)  Labs Reviewed  CBC WITH DIFFERENTIAL/PLATELET - Abnormal; Notable for the following:    WBC 13.9 (*)    RBC 4.03 (*)    Hemoglobin 8.2 (*)    HCT 27.9 (*)    MCV 69.2 (*)    MCH 20.2 (*)    MCHC 29.2 (*)    RDW 19.0 (*)    Neutro Abs 11.1 (*)    Lymphs Abs 0.6 (*)    Monocytes Absolute 2.0 (*)    All other components within normal limits  BASIC METABOLIC PANEL - Abnormal; Notable for the following:    Sodium 133 (*)    Chloride 93 (*)    Glucose, Bld 183 (*)    BUN 24 (*)    Calcium 8.6 (*)    All other components within normal limits  TROPONIN I  CREATININE, SERUM   ____________________________________________  EKG  ED ECG REPORT I, Doran Stabler, the attending physician, personally viewed and interpreted this ECG.   Date: 04/21/2015  EKG Time: 1735  Rate: 81  Rhythm: normal sinus rhythm  Axis: Normal axis  Intervals:Long QT  ST&T Change: No  ST elevations or depressions. No abnormal T-wave inversions. Poor baseline. Likely related to patient's tachypnea.  ____________________________________________  RADIOLOGY  Progressive right lower lung airspace disease, possibly pneumonia. Persistent left lower lobe masslike opacity. ____________________________________________   PROCEDURES    ____________________________________________   INITIAL IMPRESSION / ASSESSMENT AND PLAN / ED COURSE  Pertinent labs & imaging results that were available during my care of the patient were reviewed by me and considered in my medical decision making (see chart for details).  ----------------------------------------- 7:04 PM on 04/21/2015 -----------------------------------------  Patient is satting well on the trach collar. Discussed x-ray findings with the family as well as the patient and the need for admission to the hospital. They understand and are willing to comply. We'll treat the patient for hospital-acquired pneumonia considering he was recently hospitalized for similar condition. ____________________________________________   FINAL CLINICAL IMPRESSION(S) / ED DIAGNOSES  Hospital-acquired pneumonia.    Orbie Pyo, MD 04/21/15 1905  Signed out to Dr. Tressia Miners.  Orbie Pyo, MD 04/21/15 Drema Halon

## 2015-04-21 NOTE — ED Notes (Signed)
EMS states that they were called out for increased SOB and thick secretions in trach that wife was unable to suction. Pt states what has come out was thick and yellow. Pt is normally on room air but since he was discharged last week from hospital with Pneumonia he has been wearing oxygen.EMS states that pt has been battling Pneumonia since April.  EMS states they noted pt O2 dropped to 88% on Room air.

## 2015-04-22 ENCOUNTER — Inpatient Hospital Stay: Payer: Medicare Other

## 2015-04-22 DIAGNOSIS — Y95 Nosocomial condition: Secondary | ICD-10-CM

## 2015-04-22 DIAGNOSIS — J9601 Acute respiratory failure with hypoxia: Secondary | ICD-10-CM

## 2015-04-22 DIAGNOSIS — R918 Other nonspecific abnormal finding of lung field: Secondary | ICD-10-CM

## 2015-04-22 DIAGNOSIS — J189 Pneumonia, unspecified organism: Secondary | ICD-10-CM | POA: Insufficient documentation

## 2015-04-22 DIAGNOSIS — E43 Unspecified severe protein-calorie malnutrition: Secondary | ICD-10-CM | POA: Insufficient documentation

## 2015-04-22 LAB — BLOOD GAS, ARTERIAL
ACID-BASE EXCESS: 10.5 mmol/L — AB (ref 0.0–3.0)
ALLENS TEST (PASS/FAIL): POSITIVE — AB
Bicarbonate: 36.7 mEq/L — ABNORMAL HIGH (ref 21.0–28.0)
FIO2: 0.35
O2 SAT: 91.5 %
PATIENT TEMPERATURE: 37
PCO2 ART: 54 mmHg — AB (ref 32.0–48.0)
pH, Arterial: 7.44 (ref 7.350–7.450)
pO2, Arterial: 60 mmHg — ABNORMAL LOW (ref 83.0–108.0)

## 2015-04-22 LAB — BASIC METABOLIC PANEL
ANION GAP: 6 (ref 5–15)
BUN: 20 mg/dL (ref 6–20)
CALCIUM: 8.9 mg/dL (ref 8.9–10.3)
CO2: 36 mmol/L — AB (ref 22–32)
CREATININE: 0.69 mg/dL (ref 0.61–1.24)
Chloride: 94 mmol/L — ABNORMAL LOW (ref 101–111)
Glucose, Bld: 225 mg/dL — ABNORMAL HIGH (ref 65–99)
Potassium: 4.5 mmol/L (ref 3.5–5.1)
SODIUM: 136 mmol/L (ref 135–145)

## 2015-04-22 LAB — CBC
HCT: 28.8 % — ABNORMAL LOW (ref 40.0–52.0)
HEMOGLOBIN: 8.7 g/dL — AB (ref 13.0–18.0)
MCH: 21 pg — AB (ref 26.0–34.0)
MCHC: 30.2 g/dL — ABNORMAL LOW (ref 32.0–36.0)
MCV: 69.6 fL — ABNORMAL LOW (ref 80.0–100.0)
PLATELETS: 232 10*3/uL (ref 150–440)
RBC: 4.14 MIL/uL — AB (ref 4.40–5.90)
RDW: 19.5 % — ABNORMAL HIGH (ref 11.5–14.5)
WBC: 12 10*3/uL — AB (ref 3.8–10.6)

## 2015-04-22 LAB — MRSA PCR SCREENING: MRSA by PCR: NEGATIVE

## 2015-04-22 MED ORDER — CHLORHEXIDINE GLUCONATE 0.12 % MT SOLN
15.0000 mL | Freq: Two times a day (BID) | OROMUCOSAL | Status: DC
  Administered 2015-04-22 – 2015-04-27 (×11): 15 mL via OROMUCOSAL
  Filled 2015-04-22 (×6): qty 15

## 2015-04-22 MED ORDER — FLUCONAZOLE IN SODIUM CHLORIDE 400-0.9 MG/200ML-% IV SOLN
400.0000 mg | INTRAVENOUS | Status: DC
  Administered 2015-04-22 – 2015-04-23 (×2): 400 mg via INTRAVENOUS
  Filled 2015-04-22 (×3): qty 200

## 2015-04-22 MED ORDER — MIDAZOLAM HCL 2 MG/2ML IJ SOLN
0.5000 mg | Freq: Once | INTRAMUSCULAR | Status: AC
  Administered 2015-04-22: 0.5 mg via INTRAVENOUS

## 2015-04-22 MED ORDER — FENTANYL CITRATE (PF) 100 MCG/2ML IJ SOLN
50.0000 ug | Freq: Once | INTRAMUSCULAR | Status: AC
  Administered 2015-04-22: 50 ug via INTRAVENOUS

## 2015-04-22 MED ORDER — CETYLPYRIDINIUM CHLORIDE 0.05 % MT LIQD
7.0000 mL | Freq: Two times a day (BID) | OROMUCOSAL | Status: DC
  Administered 2015-04-22 – 2015-04-27 (×11): 7 mL via OROMUCOSAL

## 2015-04-22 MED ORDER — ACETYLCYSTEINE 20 % IN SOLN: 600.0000 mg | Freq: Two times a day (BID) | RESPIRATORY_TRACT | Status: DC

## 2015-04-22 MED ORDER — ACETYLCYSTEINE 20 % IN SOLN
4.0000 mL | Freq: Two times a day (BID) | RESPIRATORY_TRACT | Status: AC
  Administered 2015-04-22 – 2015-04-26 (×9): 4 mL via RESPIRATORY_TRACT
  Filled 2015-04-22 (×10): qty 4

## 2015-04-22 MED ORDER — CHLORHEXIDINE GLUCONATE CLOTH 2 % EX PADS
6.0000 | MEDICATED_PAD | Freq: Every day | CUTANEOUS | Status: DC
  Administered 2015-04-22 – 2015-04-25 (×4): 6 via TOPICAL

## 2015-04-22 MED ORDER — FREE WATER
250.0000 mL | Freq: Three times a day (TID) | Status: DC
  Administered 2015-04-22 – 2015-04-27 (×15): 250 mL

## 2015-04-22 MED ORDER — PANTOPRAZOLE SODIUM 40 MG PO PACK
40.0000 mg | PACK | Freq: Two times a day (BID) | ORAL | Status: DC
  Administered 2015-04-22 – 2015-04-27 (×10): 40 mg
  Filled 2015-04-22 (×11): qty 20

## 2015-04-22 MED ORDER — JEVITY 1.5 CAL/FIBER PO LIQD
1000.0000 mL | ORAL | Status: DC
  Administered 2015-04-22 – 2015-04-25 (×5): 1000 mL

## 2015-04-22 MED ORDER — FENTANYL CITRATE (PF) 100 MCG/2ML IJ SOLN
INTRAMUSCULAR | Status: AC
  Filled 2015-04-22: qty 2

## 2015-04-22 MED ORDER — MIDAZOLAM HCL 2 MG/2ML IJ SOLN
INTRAMUSCULAR | Status: AC
  Filled 2015-04-22: qty 2

## 2015-04-22 MED ORDER — FLUCONAZOLE IN SODIUM CHLORIDE 400-0.9 MG/200ML-% IV SOLN
400.0000 mg | INTRAVENOUS | Status: DC
  Filled 2015-04-22: qty 200

## 2015-04-22 MED ORDER — MUPIROCIN 2 % EX OINT
1.0000 "application " | TOPICAL_OINTMENT | Freq: Two times a day (BID) | CUTANEOUS | Status: DC
  Administered 2015-04-22 – 2015-04-25 (×6): 1 via NASAL
  Filled 2015-04-22: qty 22

## 2015-04-22 NOTE — Progress Notes (Signed)
Patient began complaining of SOB. Oxygen saturations dropped to 68% on 50% oxygenation via trach collar. I began to oxygenate patient via ambu bag with assistance from another RN at bedside. RT and MD were called and they came to deep suction patient. Patient tolerated suction well. A moderated amount of pink-tinged, thick sputum was obtained and oxygen saturation has improved to 99% on 50% oxygenation via trach collar. MD has decided to perform bedside bronchoscopy, he has obtained consent from family. Patient remains alert and oriented.

## 2015-04-22 NOTE — Progress Notes (Signed)
[facility] NAME: Stephen Taylor    MR#:  [id number]  DATE OF BIRTH:  09-05-1931  SUBJECTIVE:  CHIEF COMPLAINT:   Chief Complaint  Patient presents with  . Shortness of Breath   Patient here due to shortness of breath, cough, productive sputum. 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 Chest x-ray findings are suggestive of pneumonia. Patient was admitted and started on IV antibiotics.  REVIEW OF SYSTEMS:    Review of Systems  Constitutional: Negative for fever and chills.  HENT: Negative for congestion and tinnitus.   Eyes: Negative for blurred vision and double vision.  Respiratory: Positive for cough, sputum production and shortness of breath. Negative for wheezing.   Cardiovascular: Negative for chest pain, orthopnea and PND.  Gastrointestinal: Negative for nausea, vomiting, abdominal pain and diarrhea.  Genitourinary: Negative for dysuria and hematuria.  Neurological: Positive for weakness (generalized). Negative for dizziness, sensory change and focal weakness.  All other systems reviewed and are negative.   Nutrition: Tube feedings Tolerating Diet: Yes Tolerating PT: Await evaluation     DRUG ALLERGIES:   Allergies  Allergen Reactions  . Morphine And Related Other (See Comments)    Reaction:  Altered mental status    VITALS:  Blood pressure 125/55, pulse 74, temperature 96.8 F (36 C), temperature source Axillary, resp. rate 27, height 5\' 11"  (1.803 m), weight 78.835 kg (173 lb 12.8 oz), SpO2 98 %.  PHYSICAL EXAMINATION:   Physical Exam  GENERAL:  81 y.o.-year-old patient lethargic lying in the bed in mild distress  EYES: Pupils equal, round, reactive to light and accommodation. No scleral icterus. Extraocular muscles intact.  HEENT: Head atraumatic, normocephalic. Oropharynx and nasopharynx clear.  + Tracheostomy with Trach Collar.   NECK:  Supple, no jugular venous  distention. No thyroid enlargement, no tenderness.  LUNGS: Poor Resp. effort, no wheezing, rales, rhonchi. No use of accessory muscles of respiration.  CARDIOVASCULAR: S1, S2 normal. No murmurs, rubs, or gallops.  ABDOMEN: Soft, nontender, nondistended. Bowel sounds present. No organomegaly or mass.  EXTREMITIES: No cyanosis, clubbing or edema b/l.    NEUROLOGIC: Cranial nerves II through XII are intact. No focal Motor or sensory deficits b/l.  Globally weak PSYCHIATRIC: The patient is alert and oriented x 3.  SKIN: No obvious rash, lesion, or ulcer.    LABORATORY PANEL:   CBC  Recent Labs Lab 04/22/15 0556  WBC 12.0*  HGB 8.7*  HCT 28.8*  PLT 232   ------------------------------------------------------------------------------------------------------------------  Chemistries   Recent Labs Lab 04/22/15 0556  NA 136  K 4.5  CL 94*  CO2 36*  GLUCOSE 225*  BUN 20  CREATININE 0.69  CALCIUM 8.9   ------------------------------------------------------------------------------------------------------------------  Cardiac Enzymes  Recent Labs Lab 04/21/15 1742  TROPONINI <0.03   ------------------------------------------------------------------------------------------------------------------  RADIOLOGY:  Dg Chest 1 View  04/21/2015  CLINICAL DATA:  increased SOB and thick secretions in trach that wife was unable to suction. Pt states what has come out was thick and yellow. Pt is normally on room air EXAM: CHEST  1 VIEW COMPARISON:  04/06/2015 FINDINGS: Tracheostomy remains in place. Low lung volumes with crowding of bronchovascular structures particularly around the hila. Masslike opacity in the left lower lung as before. Right infrahilar airspace opacity somewhat less well demarcated than on previous exam. Airspace opacities extend laterally at the right lung base. Vascular clips overlie the right hilum. Heart size upper limits normal for technique. Atheromatous aorta. No  pneumothorax. Blunting of the left lateral costophrenic angle suggesting small effusion. Visualized skeletal structures are unremarkable. IMPRESSION: 1. Progressive right lower lung airspace disease possibly pneumonia. 2. Persistent left lower lobe lung masslike opacity. CT may be useful to differentiate pneumonia from neoplasm. Electronically Signed   By: Lucrezia Europe M.D.   On: 04/21/2015 18:08   Ct Chest W Contrast  04/21/2015  CLINICAL DATA:  81 year old male with increasing shortness of breath and thick tracheal secretions. EXAM: CT CHEST WITH CONTRAST TECHNIQUE: Multidetector CT imaging of the chest was performed during intravenous contrast administration. CONTRAST:  5mL OMNIPAQUE IOHEXOL 300 MG/ML  SOLN COMPARISON:  Chest CT 02/22/2013. FINDINGS: Mediastinum/Lymph Nodes: Tracheostomy tube in position with tip approximately 2.2 cm above the carina. Heart size is borderline enlarged. There is no significant pericardial fluid, thickening or pericardial calcification. There is atherosclerosis of the thoracic aorta, the great vessels of the mediastinum and the coronary arteries, including calcified atherosclerotic plaque in the left main, left anterior descending, left circumflex and right coronary arteries. Severe calcifications of the aortic valve. Mild calcifications of the mitral annulus. Right hilar lymphadenopathy measuring up to 1.4 cm in short axis. Left hilar lymphadenopathy measuring up to 12 mm in short axis. Multiple borderline enlarged mediastinal lymph nodes measuring up to 9 mm in short axis in the low right paratracheal station. Postoperative changes from apparent partial esophagectomy and gastric pull-through. No axillary lymphadenopathy. Lungs/Pleura: Status post right upper lobectomy. Large bilateral lower lobe masses. The right lower lobe mass measures up to 11.1 x 7.8 cm (image 39 of series 2) and appears to have extensive internal low-attenuation, which may suggest internal areas of  necrosis. The left lower lobe mass measures up to 6.5 x 7.8 cm (image 38 of series 2). Both of these lesions are associated with some distal postobstructive changes, likely postobstructive pneumonitis. A few other scattered tiny pulmonary nodules are noted throughout the lungs bilaterally, nonspecific. Small right pleural effusion lying dependently. Upper Abdomen: Partially calcified gallstones lying dependently in the gallbladder. Incompletely visualize 9 mm calculus in the lower pole collecting system of the right kidney. Numerous surgical clips throughout the upper abdomen. Percutaneous gastrostomy tube with tip in the distal body of the stomach. Extensive atherosclerosis. Musculoskeletal/Soft Tissues: Multiple old healed right-sided rib fractures with posttraumatic deformity in the right chest wall. There are no aggressive appearing lytic or blastic lesions noted in the visualized portions of the skeleton. IMPRESSION: 1. Large bilateral lower lobe mass is concerning for either metastatic disease or synchronous primary bronchogenic neoplasms. These have increased significantly in size compared to prior CT of the abdomen and pelvis 06/14/2014. New small right pleural effusion is likely malignant. Mild postobstructive pneumonitis within the lower lobes of the lungs bilaterally. Bilateral hilar lymphadenopathy is also noted. 2. Cholelithiasis without evidence of acute cholecystitis at this time. 3. 9 mm nonobstructive calculus in the lower pole of the right kidney. 4. There are calcifications of the aortic valve (severe) and mitral annulus. Echocardiographic correlation for evaluation of potential valvular dysfunction may be warranted if clinically indicated. Electronically Signed   By: Vinnie Langton M.D.   On: 04/21/2015 21:18     ASSESSMENT AND PLAN:   81 year old male with past medical history of laryngeal cancer status post tracheostomy, history of prostate cancer, COPD, history of previous CVA,  depression, BPH, osteoarthritis, chronic anemia, history of coronary artery disease, who presents to the hospital due to shortness of breath and weakness and noted to be in acute on chronic respiratory failure  and also noted to have a pneumonia.  #1 Acute on chronic respiratory failure- secondary to worsening pneumonia-healthcare acquired pneumonia - Patient is status post tracheostomy due to previous history of laryngeal cancer. - Continue broad-spectrum IV antibiotics with vancomycin, Zosyn. Follow sputum and blood cultures. Continue aggressive pulmonary toileting. -CT chest showing bibasilar atelectasis versus possible underlying mass. Appreciate pulmonary consult and patient may get a bronchoscopy to further evaluate this.  #2 COPD-with acute exacerbation, due to underlying pneumonia. -Continue IV Solu-Medrol, DuoNeb's, frequent suctioning and aggressive pulmonary toileting. -Continue broad-spectrum IV antibiotics for the pneumonia.  #3 prostate cancer-following with urologist. Has adenocarcinoma of the prostate -Received six-month Lupron injection in August 2016 -Continue outpatient follow-up.  Continue Flomax  #4 Barrett's esophagus-continue outpatient monitoring. -Continue Protonix  #5 depression-continue Celexa    All the records are reviewed and case discussed with Care Management/Social Workerr. Management plans discussed with the patient, family and they are in agreement.  CODE STATUS: Full  DVT Prophylaxis: Lovenox  TOTAL TIME TAKING CARE OF THIS PATIENT: 30 minutes.   POSSIBLE D/C IN 2-3 DAYS, DEPENDING ON CLINICAL CONDITION.   Henreitta Leber M.D on 04/22/2015 at 1:28 PM  Between 7am to 6pm - Pager - [phone]  After 6pm go to [email or web address] - password [email or web address] [facility]  Office  [phone]  CC: Primary care physician; Lavera Guise, MD

## 2015-04-22 NOTE — Progress Notes (Signed)
ANTIBIOTIC CONSULT NOTE - INITIAL  Pharmacy Consult for vancomycin and piperacillin/tazobactam Indication: pneumonia  Allergies  Allergen Reactions  . Morphine And Related Other (See Comments)    Reaction:  Altered mental status    Patient Measurements: Height: 5\' 11"  (180.3 cm) Weight: 173 lb 12.8 oz (78.835 kg) IBW/kg (Calculated) : 75.3  Vital Signs: Temp: 96.3 F (35.7 C) (10/26 1200) BP: 95/52 mmHg (10/26 1200) Pulse Rate: 74 (10/26 1200) Intake/Output from previous day: 10/25 0701 - 10/26 0700 In: 29 [I.V.:29] Out: 450 [Urine:450] Intake/Output from this shift: Total I/O In: 100 [Other:100] Out: 100 [Urine:100]  Labs:  Recent Labs  04/21/15 1742 04/22/15 0556  WBC 13.9* 12.0*  HGB 8.2* 8.7*  PLT 240 232  CREATININE 0.68 0.69   Estimated Creatinine Clearance: 77.1 mL/min (by C-G formula based on Cr of 0.69). No results for input(s): VANCOTROUGH, VANCOPEAK, VANCORANDOM, GENTTROUGH, GENTPEAK, GENTRANDOM, TOBRATROUGH, TOBRAPEAK, TOBRARND, AMIKACINPEAK, AMIKACINTROU, AMIKACIN in the last 72 hours.   Microbiology: Recent Results (from the past 720 hour(s))  Culture, blood (routine x 2)     Status: None   Collection Time: 04/06/15  4:11 PM  Result Value Ref Range Status   Specimen Description BLOOD RIGHT ANTECUBITAL  Final   Special Requests BOTTLES DRAWN AEROBIC AND ANAEROBIC 10ML  Final   Culture NO GROWTH 5 DAYS  Final   Report Status 04/11/2015 FINAL  Final  Culture, blood (routine x 2)     Status: None   Collection Time: 04/06/15  4:11 PM  Result Value Ref Range Status   Specimen Description BLOOD LEFT  Final   Special Requests BOTTLES DRAWN AEROBIC AND ANAEROBIC 20ML  Final   Culture NO GROWTH 5 DAYS  Final   Report Status 04/11/2015 FINAL  Final  Culture, expectorated sputum-assessment     Status: None   Collection Time: 04/07/15  6:25 AM  Result Value Ref Range Status   Specimen Description SPUTUM  Final   Special Requests Immunocompromised   Final   Sputum evaluation THIS SPECIMEN IS ACCEPTABLE FOR SPUTUM CULTURE  Final   Report Status 04/07/2015 FINAL  Final  Culture, respiratory (NON-Expectorated)     Status: None   Collection Time: 04/07/15  6:25 AM  Result Value Ref Range Status   Specimen Description SPUTUM  Final   Special Requests Immunocompromised Reflexed from [id number]  Final   Gram Stain   Final    MODERATE WBC SEEN MANY YEAST MANY GRAM POSITIVE RODS GOOD SPECIMEN - 80-90% WBCS    Culture   Final    HEAVY GROWTH CANDIDA TROPICALIS MODERATE GROWTH CANDIDA ALBICANS RARE ACINETOBACTER LWOFFII    Report Status 04/12/2015 FINAL  Final   Organism ID, Bacteria ACINETOBACTER LWOFFII  Final      Susceptibility   Acinetobacter lwoffii - MIC*    CEFTRIAXONE Value in next row Intermediate      INTERMEDIATE16    CIPROFLOXACIN Value in next row Resistant      RESISTANT>=4    GENTAMICIN Value in next row Sensitive      SENSITIVE<=1    IMIPENEM Value in next row Sensitive      SENSITIVE<=0.25    PIP/TAZO Value in next row Resistant      RESISTANT>=128    TRIMETH/SULFA Value in next row Sensitive      SENSITIVE<=20    CEFEPIME Value in next row Sensitive      SENSITIVE2    AMPICILLIN/SULBACTAM Value in next row Sensitive      SENSITIVE<=2    *  RARE ACINETOBACTER LWOFFII  Culture, expectorated sputum-assessment     Status: None (Preliminary result)   Collection Time: 04/22/15 12:40 AM  Result Value Ref Range Status   Specimen Description SPUTUM  Final   Special Requests Normal  Final   Sputum evaluation THIS SPECIMEN IS ACCEPTABLE FOR SPUTUM CULTURE  Final   Report Status PENDING  Incomplete    Medical History: Past Medical History  Diagnosis Date  . Cancer (HCC)     laryngeal cancer, prostrate cancer  . COPD (chronic obstructive pulmonary disease) ([place])   . Coronary artery disease   . Stroke [facility])     no residual weakness  . Depression   . Sleep apnea   . MRSA pneumonia ([place])   . Barrett's esophagus    . BPH (benign prostatic hyperplasia)   . Nephrolithiasis   . Osteoarthritis   . Anemia     Medications:  Anti-infectives    Start     Dose/Rate Route Frequency Ordered Stop   04/22/15 0600  piperacillin-tazobactam (ZOSYN) IVPB 3.375 g  Status:  Discontinued     3.375 g 12.5 mL/hr over 240 Minutes Intravenous 3 times per day 04/21/15 1904 04/21/15 2144   04/22/15 0400  piperacillin-tazobactam (ZOSYN) IVPB 3.375 g     3.375 g 12.5 mL/hr over 240 Minutes Intravenous 3 times per day 04/21/15 2206     04/22/15 0100  vancomycin (VANCOCIN) IVPB 1000 mg/200 mL premix  Status:  Discontinued     1,000 mg 200 mL/hr over 60 Minutes Intravenous Every 12 hours 04/21/15 1856 04/21/15 2144   04/22/15 0100  vancomycin (VANCOCIN) IVPB 1000 mg/200 mL premix     1,000 mg 200 mL/hr over 60 Minutes Intravenous Every 12 hours 04/21/15 2205     04/21/15 2200  piperacillin-tazobactam (ZOSYN) IVPB 3.375 g  Status:  Discontinued     3.375 g 100 mL/hr over 30 Minutes Intravenous 3 times per day 04/21/15 1848 04/21/15 1903   04/21/15 2000  piperacillin-tazobactam (ZOSYN) IVPB 3.375 g     3.375 g 100 mL/hr over 30 Minutes Intravenous  Once 04/21/15 1903 04/21/15 2007   04/21/15 1900  vancomycin (VANCOCIN) IVPB 1000 mg/200 mL premix     1,000 mg 200 mL/hr over 60 Minutes Intravenous  Once 04/21/15 1848 04/21/15 2043     Assessment: Pharmacy consulted to dose piperacillin/tazobactam and vancomycin in this 81 year old male presenting with pneumonia.   Goal of Therapy:  Vancomycin trough level 15-20 mcg/ml  Plan:  Expected duration 7 days with resolution of temperature and/or normalization of WBC Measure antibiotic drug levels at steady state Follow up culture results   Will continue Vancomycin maintenance dose of 1g q12h. Vancomycin trough 10/27 @ 12:30  Piperacillin/tazobactam 3.375 g IV q8h EI  Pharmacy will continue to follow renal function and vancomycin levels and make dose adjustments as  needed. Thank you for the consult.  Ulice Dash D 04/22/2015,12:35 PM

## 2015-04-22 NOTE — Progress Notes (Signed)
Patient with thick copious bloody secretions. Multiple plugs pulled upon suctioning. Strongly recommend bronchoscopy washing to clear secretions, as patient has a Pharmacologist. This type of trach does not have an inner cannula, which means that patient could potentially plug this trach frequently.

## 2015-04-22 NOTE — Consult Note (Signed)
[facility] Infectious Disease     Reason for Consult:PNA   Referring Physician: Benjamin Stain Date of Admission:  04/21/2015   Active Problems:   Acute respiratory failure (HCC)   Protein-calorie malnutrition, severe   Hospital acquired PNA   HPI: Stephen Taylor is a 81 y.o. male history of chronic respiratory failure on trach collar, COPD, history of laryngeal cancer, prostate cancer currently on injections presents to the hospital from home secondary to respiratory distress and difficulty breathing. Patient has a tracheostomy, on oxygen at this time and is unable to speak. His wife at bedside provides most of the history. According to the wife, patient had laryngeal cancer history and had radiation and tracheostomy done about 15 years ago. He was doing fine at home, on room air, even doing household activities up until 3 weeks ago. He was admitted to the hospital at that time for pneumonia. He was in the hospital and got treated with IV antibiotics and was discharged about 2 weeks ago on oral Augmentin. He was requiring trach collar oxygen support at the time of discharge. Wife reports with the 10 days of abx he did ok but once off abx started to worsen.. Over the last week he was having increasingly thick mucus, wife had to suction him more often.  Since admit had CT scan with mass noted and had bronch 10.26 with thick secretions.  Prior sputum from 10/16 had Acineobacter lwoffii and and candida tropicals and albicans Has had night sweats per wife and lost 13 #s  Past Medical History  Diagnosis Date  . Cancer (HCC)     laryngeal cancer, prostrate cancer  . COPD (chronic obstructive pulmonary disease) ([place])   . Coronary artery disease   . Stroke [facility]     no residual weakness  . Depression   . Sleep apnea   . MRSA pneumonia ([place])   . Barrett's esophagus   . BPH (benign prostatic hyperplasia)   . Nephrolithiasis   . Osteoarthritis   . Anemia    Past Surgical History  Procedure  Laterality Date  . Tracheostomy N/A   . Gastrostomy w/ feeding tube N/A   . Laminectomy    . Rotator cuff repair    . Lung removal, partial    . Esophageogastrectomy    . Peg tube placement    . Renal calculi    . Ureteral stent placement    . Flexible bronchoscopy N/A 12/24/2014    Procedure: FLEXIBLE BRONCHOSCOPY;  Surgeon: Allyne Gee, MD;  Location: [facility] ORS;  Service: Pulmonary;  Laterality: N/A;   Social History  Substance Use Topics  . Smoking status: Former Smoker -- 2.00 packs/day for 30 years    Types: Cigarettes    Quit date: 12/23/1984  . Smokeless tobacco: None  . Alcohol Use: No   Family History  Problem Relation Age of Onset  . Diabetes Brother     Allergies:  Allergies  Allergen Reactions  . Morphine And Related Other (See Comments)    Reaction:  Altered mental status    Current antibiotics: Antibiotics Given (last 72 hours)    Date/Time Action Medication Dose Rate   04/22/15 0054 Given   vancomycin (VANCOCIN) IVPB 1000 mg/200 mL premix 1,000 mg 200 mL/hr   04/22/15 0443 Given   piperacillin-tazobactam (ZOSYN) IVPB 3.375 g 3.375 g 12.5 mL/hr   04/22/15 1215 Given   vancomycin (VANCOCIN) IVPB 1000 mg/200 mL premix 1,000 mg 200 mL/hr   04/22/15 1320 Given  piperacillin-tazobactam (ZOSYN) IVPB 3.375 g 3.375 g 12.5 mL/hr      MEDICATIONS: . acetylcysteine  4 mL Nebulization BID  . antiseptic oral rinse  7 mL Mouth Rinse q12n4p  . aspirin EC  81 mg Oral Daily  . chlorhexidine  15 mL Mouth Rinse BID  . Chlorhexidine Gluconate Cloth  6 each Topical Q0600  . citalopram  20 mg Per Tube Daily  . enoxaparin (LOVENOX) injection  40 mg Subcutaneous Q24H  . ferrous sulfate  325 mg Oral Daily  . folic acid  1 mg Per Tube Daily  . free water  250 mL Per Tube 3 times per day  . ipratropium-albuterol  3 mL Nebulization Q4H  . loratadine  10 mg Per Tube Daily  . methylPREDNISolone (SOLU-MEDROL) injection  40 mg Intravenous 3 times per day  . montelukast  10  mg Per Tube QHS  . multivitamin with minerals  1 tablet Oral Daily  . mupirocin ointment  1 application Nasal BID  . pantoprazole  40 mg Oral BID  . piperacillin-tazobactam (ZOSYN)  IV  3.375 g Intravenous 3 times per day  . sodium chloride  3 mL Intravenous Q12H  . tamsulosin  0.4 mg Oral Daily  . vancomycin  1,000 mg Intravenous Q12H    Review of Systems - 11 systems reviewed and negative per HPI   OBJECTIVE: Temp:  [95.4 F (35.2 C)-98.8 F (37.1 C)] 96.4 F (35.8 C) (10/26 1400) Pulse Rate:  [39-95] 79 (10/26 1400) Resp:  [13-35] 32 (10/26 1400) BP: (83-158)/(39-72) 137/54 mmHg (10/26 1400) SpO2:  [88 %-100 %] 95 % (10/26 1523) FiO2 (%):  [35 %-50 %] 50 % (10/26 1523) Weight:  [78.835 kg (173 lb 12.8 oz)] 78.835 kg (173 lb 12.8 oz) (10/25 1734) Physical Exam  Constitutional: He is oriented to person, place, and time.thin, trach in place, chronically ill appearing. Can talk with finger over trach but diff to undestand HENT: perrla Mouth/Throat: Trach site wnl Oropharynx is clear and moist. No oropharyngeal exudate.  Cardiovascular: Normal rate, regular rhythm and normal heart sounds.  Pulmonary/Chest: bil rhonchi Abdominal: Soft. Bowel sounds are normal. He exhibits no distension. There is no tenderness.  Lymphadenopathy: He has no cervical adenopathy.  Neurological: He is alert and oriented to person, place, and time.  Skin: Skin is warm and dry. No rash noted. No erythema.  Psychiatric: He has a normal mood and affect.  LABS: Results for orders placed or performed during the hospital encounter of 04/21/15 (from the past 48 hour(s))  CBC with Differential     Status: Abnormal   Collection Time: 04/21/15  5:42 PM  Result Value Ref Range   WBC 13.9 (H) 3.8 - 10.6 K/uL   RBC 4.03 (L) 4.40 - 5.90 MIL/uL   Hemoglobin 8.2 (L) 13.0 - 18.0 g/dL    Comment: RESULT REPEATED AND VERIFIED   HCT 27.9 (L) 40.0 - 52.0 %    Comment: RESULT REPEATED AND VERIFIED   MCV 69.2 (L) 80.0  - 100.0 fL   MCH 20.2 (L) 26.0 - 34.0 pg   MCHC 29.2 (L) 32.0 - 36.0 g/dL   RDW 19.0 (H) 11.5 - 14.5 %   Platelets 240 150 - 440 K/uL   Neutrophils Relative % 80% %   Neutro Abs 11.1 (H) 1.4 - 6.5 K/uL   Lymphocytes Relative 4% %   Lymphs Abs 0.6 (L) 1.0 - 3.6 K/uL   Monocytes Relative 15% %   Monocytes Absolute 2.0 (H) 0.2 -  1.0 K/uL   Eosinophils Relative 1% %   Eosinophils Absolute 0.1 0 - 0.7 K/uL   Basophils Relative 0% %   Basophils Absolute 0.1 0 - 0.1 K/uL  Troponin I     Status: None   Collection Time: 04/21/15  5:42 PM  Result Value Ref Range   Troponin I <0.03 <0.031 ng/mL    Comment:        NO INDICATION OF MYOCARDIAL INJURY.   Basic metabolic panel     Status: Abnormal   Collection Time: 04/21/15  5:42 PM  Result Value Ref Range   Sodium 133 (L) 135 - 145 mmol/L   Potassium 4.7 3.5 - 5.1 mmol/L    Comment: HEMOLYSIS AT THIS LEVEL MAY AFFECT RESULT   Chloride 93 (L) 101 - 111 mmol/L   CO2 31 22 - 32 mmol/L   Glucose, Bld 183 (H) 65 - 99 mg/dL   BUN 24 (H) 6 - 20 mg/dL   Creatinine, Ser 0.68 0.61 - 1.24 mg/dL   Calcium 8.6 (L) 8.9 - 10.3 mg/dL   GFR calc non Af Amer >60 >60 mL/min   GFR calc Af Amer >60 >60 mL/min    Comment: (NOTE) The eGFR has been calculated using the CKD EPI equation. This calculation has not been validated in all clinical situations. eGFR's persistently <60 mL/min signify possible Chronic Kidney Disease.    Anion gap 9 5 - 15  Culture, expectorated sputum-assessment     Status: None (Preliminary result)   Collection Time: 04/22/15 12:40 AM  Result Value Ref Range   Specimen Description SPUTUM    Special Requests Normal    Sputum evaluation THIS SPECIMEN IS ACCEPTABLE FOR SPUTUM CULTURE    Report Status PENDING   Blood gas, arterial     Status: Abnormal   Collection Time: 04/22/15  4:30 AM  Result Value Ref Range   FIO2 0.35    Delivery systems TRACH COLLAR    pH, Arterial 7.44 7.350 - 7.450   pCO2 arterial 54 (H) 32.0 - 48.0  mmHg   pO2, Arterial 60 (L) 83.0 - 108.0 mmHg   Bicarbonate 36.7 (H) 21.0 - 28.0 mEq/L   Acid-Base Excess 10.5 (H) 0.0 - 3.0 mmol/L   O2 Saturation 91.5 %   Patient temperature 37.0    Collection site RIGHT RADIAL    Sample type ARTERIAL DRAW    Allens test (pass/fail) POSITIVE (A) PASS  Basic metabolic panel     Status: Abnormal   Collection Time: 04/22/15  5:56 AM  Result Value Ref Range   Sodium 136 135 - 145 mmol/L   Potassium 4.5 3.5 - 5.1 mmol/L   Chloride 94 (L) 101 - 111 mmol/L   CO2 36 (H) 22 - 32 mmol/L   Glucose, Bld 225 (H) 65 - 99 mg/dL   BUN 20 6 - 20 mg/dL   Creatinine, Ser 0.69 0.61 - 1.24 mg/dL   Calcium 8.9 8.9 - 10.3 mg/dL   GFR calc non Af Amer >60 >60 mL/min   GFR calc Af Amer >60 >60 mL/min    Comment: (NOTE) The eGFR has been calculated using the CKD EPI equation. This calculation has not been validated in all clinical situations. eGFR's persistently <60 mL/min signify possible Chronic Kidney Disease.    Anion gap 6 5 - 15  CBC     Status: Abnormal   Collection Time: 04/22/15  5:56 AM  Result Value Ref Range   WBC 12.0 (H) 3.8 - 10.6 K/uL  RBC 4.14 (L) 4.40 - 5.90 MIL/uL   Hemoglobin 8.7 (L) 13.0 - 18.0 g/dL   HCT 28.8 (L) 40.0 - 52.0 %   MCV 69.6 (L) 80.0 - 100.0 fL   MCH 21.0 (L) 26.0 - 34.0 pg   MCHC 30.2 (L) 32.0 - 36.0 g/dL   RDW 19.5 (H) 11.5 - 14.5 %   Platelets 232 150 - 440 K/uL   No components found for: ESR, C REACTIVE PROTEIN MICRO: Recent Results (from the past 720 hour(s))  Culture, blood (routine x 2)     Status: None   Collection Time: 04/06/15  4:11 PM  Result Value Ref Range Status   Specimen Description BLOOD RIGHT ANTECUBITAL  Final   Special Requests BOTTLES DRAWN AEROBIC AND ANAEROBIC 10ML  Final   Culture NO GROWTH 5 DAYS  Final   Report Status 04/11/2015 FINAL  Final  Culture, blood (routine x 2)     Status: None   Collection Time: 04/06/15  4:11 PM  Result Value Ref Range Status   Specimen Description BLOOD LEFT   Final   Special Requests BOTTLES DRAWN AEROBIC AND ANAEROBIC 20ML  Final   Culture NO GROWTH 5 DAYS  Final   Report Status 04/11/2015 FINAL  Final  Culture, expectorated sputum-assessment     Status: None   Collection Time: 04/07/15  6:25 AM  Result Value Ref Range Status   Specimen Description SPUTUM  Final   Special Requests Immunocompromised  Final   Sputum evaluation THIS SPECIMEN IS ACCEPTABLE FOR SPUTUM CULTURE  Final   Report Status 04/07/2015 FINAL  Final  Culture, respiratory (NON-Expectorated)     Status: None   Collection Time: 04/07/15  6:25 AM  Result Value Ref Range Status   Specimen Description SPUTUM  Final   Special Requests Immunocompromised Reflexed from [id number]  Final   Gram Stain   Final    MODERATE WBC SEEN MANY YEAST MANY GRAM POSITIVE RODS GOOD SPECIMEN - 80-90% WBCS    Culture   Final    HEAVY GROWTH CANDIDA TROPICALIS MODERATE GROWTH CANDIDA ALBICANS RARE ACINETOBACTER LWOFFII    Report Status 04/12/2015 FINAL  Final   Organism ID, Bacteria ACINETOBACTER LWOFFII  Final      Susceptibility   Acinetobacter lwoffii - MIC*    CEFTRIAXONE Value in next row Intermediate      INTERMEDIATE16    CIPROFLOXACIN Value in next row Resistant      RESISTANT>=4    GENTAMICIN Value in next row Sensitive      SENSITIVE<=1    IMIPENEM Value in next row Sensitive      SENSITIVE<=0.25    PIP/TAZO Value in next row Resistant      RESISTANT>=128    TRIMETH/SULFA Value in next row Sensitive      SENSITIVE<=20    CEFEPIME Value in next row Sensitive      SENSITIVE2    AMPICILLIN/SULBACTAM Value in next row Sensitive      SENSITIVE<=2    * RARE ACINETOBACTER LWOFFII  Culture, expectorated sputum-assessment     Status: None (Preliminary result)   Collection Time: 04/22/15 12:40 AM  Result Value Ref Range Status   Specimen Description SPUTUM  Final   Special Requests Normal  Final   Sputum evaluation THIS SPECIMEN IS ACCEPTABLE FOR SPUTUM CULTURE  Final   Report  Status PENDING  Incomplete    IMAGING: Dg Chest 1 View  04/21/2015  CLINICAL DATA:  increased SOB and thick secretions in trach that wife  was unable to suction. Pt states what has come out was thick and yellow. Pt is normally on room air EXAM: CHEST  1 VIEW COMPARISON:  04/06/2015 FINDINGS: Tracheostomy remains in place. Low lung volumes with crowding of bronchovascular structures particularly around the hila. Masslike opacity in the left lower lung as before. Right infrahilar airspace opacity somewhat less well demarcated than on previous exam. Airspace opacities extend laterally at the right lung base. Vascular clips overlie the right hilum. Heart size upper limits normal for technique. Atheromatous aorta. No pneumothorax. Blunting of the left lateral costophrenic angle suggesting small effusion. Visualized skeletal structures are unremarkable. IMPRESSION: 1. Progressive right lower lung airspace disease possibly pneumonia. 2. Persistent left lower lobe lung masslike opacity. CT may be useful to differentiate pneumonia from neoplasm. Electronically Signed   By: Lucrezia Europe M.D.   On: 04/21/2015 18:08   Dg Chest 2 View  04/06/2015  CLINICAL DATA:  Shortness of breath EXAM: CHEST  2 VIEW COMPARISON:  11/13/2014 chest radiograph FINDINGS: Stable cardiomediastinal silhouette with normal heart size and atherosclerotic aortic arch. No pneumothorax. No pleural effusion. Re- demonstrated are large bilateral lower lobe lung masses, which have increased in size since 11/13/2014, for example a 7.8 cm left lower lobe lung mass, previously 6.0 cm. No overt pulmonary edema. Mild patchy bibasilar lung opacities. IMPRESSION: 1. Enlarging bilateral lower lobe lung masses, in keeping with progressive pulmonary metastases versus synchronous primary lung malignancy. 2. New mild patchy bibasilar lung opacities, which are nonspecific, cannot exclude aspiration and/or pneumonia. Electronically Signed   By: Ilona Sorrel M.D.    On: 04/06/2015 16:53   Ct Chest W Contrast  04/21/2015  CLINICAL DATA:  81 year old male with increasing shortness of breath and thick tracheal secretions. EXAM: CT CHEST WITH CONTRAST TECHNIQUE: Multidetector CT imaging of the chest was performed during intravenous contrast administration. CONTRAST:  40m OMNIPAQUE IOHEXOL 300 MG/ML  SOLN COMPARISON:  Chest CT 02/22/2013. FINDINGS: Mediastinum/Lymph Nodes: Tracheostomy tube in position with tip approximately 2.2 cm above the carina. Heart size is borderline enlarged. There is no significant pericardial fluid, thickening or pericardial calcification. There is atherosclerosis of the thoracic aorta, the great vessels of the mediastinum and the coronary arteries, including calcified atherosclerotic plaque in the left main, left anterior descending, left circumflex and right coronary arteries. Severe calcifications of the aortic valve. Mild calcifications of the mitral annulus. Right hilar lymphadenopathy measuring up to 1.4 cm in short axis. Left hilar lymphadenopathy measuring up to 12 mm in short axis. Multiple borderline enlarged mediastinal lymph nodes measuring up to 9 mm in short axis in the low right paratracheal station. Postoperative changes from apparent partial esophagectomy and gastric pull-through. No axillary lymphadenopathy. Lungs/Pleura: Status post right upper lobectomy. Large bilateral lower lobe masses. The right lower lobe mass measures up to 11.1 x 7.8 cm (image 39 of series 2) and appears to have extensive internal low-attenuation, which may suggest internal areas of necrosis. The left lower lobe mass measures up to 6.5 x 7.8 cm (image 38 of series 2). Both of these lesions are associated with some distal postobstructive changes, likely postobstructive pneumonitis. A few other scattered tiny pulmonary nodules are noted throughout the lungs bilaterally, nonspecific. Small right pleural effusion lying dependently. Upper Abdomen: Partially  calcified gallstones lying dependently in the gallbladder. Incompletely visualize 9 mm calculus in the lower pole collecting system of the right kidney. Numerous surgical clips throughout the upper abdomen. Percutaneous gastrostomy tube with tip in the distal body of the stomach.  Extensive atherosclerosis. Musculoskeletal/Soft Tissues: Multiple old healed right-sided rib fractures with posttraumatic deformity in the right chest wall. There are no aggressive appearing lytic or blastic lesions noted in the visualized portions of the skeleton. IMPRESSION: 1. Large bilateral lower lobe mass is concerning for either metastatic disease or synchronous primary bronchogenic neoplasms. These have increased significantly in size compared to prior CT of the abdomen and pelvis 06/14/2014. New small right pleural effusion is likely malignant. Mild postobstructive pneumonitis within the lower lobes of the lungs bilaterally. Bilateral hilar lymphadenopathy is also noted. 2. Cholelithiasis without evidence of acute cholecystitis at this time. 3. 9 mm nonobstructive calculus in the lower pole of the right kidney. 4. There are calcifications of the aortic valve (severe) and mitral annulus. Echocardiographic correlation for evaluation of potential valvular dysfunction may be warranted if clinically indicated. Electronically Signed   By: Vinnie Langton M.D.   On: 04/21/2015 21:18   Dg Chest Port 1 View  04/22/2015  CLINICAL DATA:  Respiratory difficulty, COPD, tracheostomy EXAM: PORTABLE CHEST 1 VIEW COMPARISON:  04/21/2015 FINDINGS: Limited portable exam, rotated to the right. Tracheostomy is 3 cm above the carina. Upper lobes remain clear. Bilateral lower lobe infrahilar masses better appreciated by CT comparison. Small pleural effusions noted. No significant interval change or pneumothorax. Postop changes in the right hilum. IMPRESSION: Stable tracheostomy and upper lobe aeration Large bilateral infrahilar lung masses Small  pleural effusions Little interval change. Electronically Signed   By: Jerilynn Mages.  Shick M.D.   On: 04/22/2015 14:19    Assessment:   CAYMEN DUBRAY is a 81 y.o. male with history of chronic respiratory failure on trach collar, COPD, history of laryngeal cancer, prostate cancer with second admission for worsening sob, suptum production. CT shows  large bil increasing lung masses with post obs changes. recnet cx with Acinetobacter - relatively sensitive- and candida. Had some clinical response per report to augmentin for 10 days at last admission but worsened after stopping. Had bronch today with cx pending. Had bronch in June 2016 with neg AFB, fungal cx + candida and klebsiella noted. Recommendations Cont vanco and zosyn pending todays cx Will likely need prolonged treatment given post obs pna and possible necrotic masses Add fluconazole Cont Pred and pulm toilet  Thank you very much for allowing me to participate in the care of this patient. Please call with questions.   Cheral Marker. Ola Spurr, MD

## 2015-04-22 NOTE — Progress Notes (Signed)
Initial Nutrition Assessment  DOCUMENTATION CODES:   Severe malnutrition in context of chronic illness  INTERVENTION:   EN: TF home regimen discussed in detail with pt's wife. Pt typically does nighttime feedings but recently has had more difficulty tolerating (pt complains of feeling full so wife has been stretching TF out longer); recommend restarting nighttime feedings. Do not have Nutren 2.0 on hospital formulary; recommend restarting Jevity 1.5 at rate of 95 ml/hr (pt on this on previous recent admission) for 16 hours providing 2280 kcals, 97 g of protein, 1155 mL of free water. Will assess for tolerance; if not tolerating well can consider 24 hours feedings at lower rate or other modalities. Discussed with wife.Wife reports they recently have increased free water flushes to due thick secretions via trach; pt currently on IVF. Will start free water at 250 mL q 8 hours. Continue to assess Education: will provide written education on safe methods for unclogging feeding tube pt and wife   NUTRITION DIAGNOSIS:   Unintentional weight loss related to chronic illness as evidenced by percent weight loss.   GOAL:   Patient will meet greater than or equal to 90% of their needs  MONITOR:    (EN, Digestive System, Electrolyte/Renal Profile, Glucose Profile, Anthropometrics)  REASON FOR ASSESSMENT:   Consult Enteral/tube feeding initiation and management  ASSESSMENT:    Pt admitted with acute respiratory failure secondary to pneumonia, acute exacerbation of COPD, pt with chronic trach. Pt with prostate cancer being followed by urologist  Past Medical History  Diagnosis Date  . Cancer (HCC)     laryngeal cancer, prostrate cancer  . COPD (chronic obstructive pulmonary disease) ([place])   . Coronary artery disease   . Stroke [facility])     no residual weakness  . Depression   . Sleep apnea   . MRSA pneumonia ([place])   . Barrett's esophagus   . BPH (benign prostatic hyperplasia)   .  Nephrolithiasis   . Osteoarthritis   . Anemia    Diet Order:  Diet NPO time specified  Food and Nutrition Related History: pt has been receiving Nutren 2.0 at home; night time feedings. Wife reports that they recently added 1 can of Boost (additional 350 kcals) to the 4 containers of Nutren 2.0 per day to feeding regimen per MD order due to concern over wt loss. Wife reports that pt goal was 2350 kcals with the Nutren and Boost   Digestive System: wife reports PEG tube clogs frequently, noted GI consulted on last admission for the same  Nutrition Focused Physical Exam: Nutrition-Focused physical exam completed. Findings are moderate fat depletion, mild to severe muscle depletion, and mild edema.  Unable to visualize legs during exam  Electrolyte and Renal Profile:  Recent Labs Lab 04/21/15 1742 04/22/15 0556  BUN 24* 20  CREATININE 0.68 0.69  NA 133* 136  K 4.7 4.5   Meds: solumedrol, MVI  Electrolyte and Renal Profile:  Recent Labs Lab 04/21/15 1742 04/22/15 0556  BUN 24* 20  CREATININE 0.68 0.69  NA 133* 136  K 4.7 4.5   Height:   Ht Readings from Last 1 Encounters:  04/21/15 5\' 11"  (1.803 m)    Weight: wife reports pt with 13 pound wt loss over the past month; 7.0% wt loss   Wt Readings from Last 1 Encounters:  04/21/15 173 lb 12.8 oz (78.835 kg)    BMI:  Body mass index is 24.25 kg/(m^2).  Estimated Nutritional Needs:   Kcal:  2177-2540 kcals (BEE 1512,  1.2 AF, 1.2-1.4 IF)   Protein:  87-103 g (1.1-1.3 g/kg)   Fluid:  1975-2370 mL (25-30 ml/kg)    HIGH Care Level  Kerman Passey MS, RD, LDN [phone] Pager

## 2015-04-22 NOTE — Progress Notes (Signed)
Rectal temp 95.4, placed on bair hugger.   Provided education about hand hygiene as pt uses his finger to cover the opening of the trach to talk. Hand sanitizer at the bedside. Encouraged to clean hands before speaking to prevent infection.

## 2015-04-22 NOTE — Progress Notes (Signed)
Inpatient Diabetes Program Recommendations  AACE/ADA: New Consensus Statement on Inpatient Glycemic Control (2015)  Target Ranges:  Prepandial:   less than 140 mg/dL      Peak postprandial:   less than 180 mg/dL (1-2 hours)      Critically ill patients:  140 - 180 mg/dL  Results for [name], [name] (MRN [id number]) as of 04/22/2015 10:46  Ref. Range 04/21/2015 17:42 04/22/2015 05:56  Glucose Latest Ref Range: 65-99 mg/dL 183 (H) 225 (H)   Review of Glycemic Control  Diabetes history: NO Outpatient Diabetes medications: NA Current orders for Inpatient glycemic control: None  Inpatient Diabetes Program Recommendations: Correction (SSI): While inpatient and ordered steroids, please consider ordering CBGs with Novolog correction scale Q4H.  Thanks, Barnie Alderman, RN, MSN, CCRN, CDE Diabetes Coordinator Inpatient Diabetes Program [phone] (Team Pager from [place] to [place]) [phone] ([place] office) [phone] [facility] office) [phone] [facility] office)

## 2015-04-22 NOTE — Consult Note (Signed)
PULMONARY / CRITICAL CARE MEDICINE   Name: Stephen Taylor MRN: [id number] DOB: December 26, 1931    ADMISSION DATE:  04/21/2015 CONSULTATION DATE:  04/22/15  REFERRING MD :  Dr. Tressia Miners   CHIEF COMPLAINT:   Short of breath and increased secretions   HISTORY OF PRESENT ILLNESS per wife and patient    81 y.o. male with a known history of chronic respiratory failure on trach collar, COPD, history of laryngeal cancer, prostate cancer currently on injections, lung ca s\p RUL lobectomy, presents to the hospital from home with respiratory distress and difficulty breathing. Patient has a tracheostomy, on oxygen at this time and is unable to speak. His wife at bedside provides most of the history. According to the wife, patient had laryngeal cancer history and had radiation and tracheostomy done about 15 years ago. He was doing fine at home, on room air, even doing household activities up until 3 weeks ago. He was admitted to the hospital at that time for pneumonia. He was in the hospital and got treated with IV antibiotics and was discharged about 2 weeks ago on oral Augmentink sputum cultures only grew yeast at that time. He was requiring trach collar oxygen support at the time of discharge. For the first couple of days after discharge patient was doing fine. Over the last week he was having increasingly thick, yellow, mucus, wife had to suction him more often. He was noticed to have some chills yesterday, nauseated all day yesterday. Yesterday the suction machine at home was not able to suction the thick secretions, he was getting increasingly tachypneic and so was brought to the hospital. CT chest shows a right lower lobe mass and a left lower lobe mass, associated with some distal post obstructive changes, likely post obstructive pneumonitis/pneumonia, pulmonary/great care was consult for further recommendations   SIGNIFICANT EVENTS    PAST MEDICAL HISTORY    :  Past Medical History  Diagnosis  Date  . Cancer (HCC)     laryngeal cancer, prostrate cancer  . COPD (chronic obstructive pulmonary disease) ([place])   . Coronary artery disease   . Stroke [facility])     no residual weakness  . Depression   . Sleep apnea   . MRSA pneumonia ([place])   . Barrett's esophagus   . BPH (benign prostatic hyperplasia)   . Nephrolithiasis   . Osteoarthritis   . Anemia    Past Surgical History  Procedure Laterality Date  . Tracheostomy N/A   . Gastrostomy w/ feeding tube N/A   . Laminectomy    . Rotator cuff repair    . Lung removal, partial    . Esophageogastrectomy    . Peg tube placement    . Renal calculi    . Ureteral stent placement    . Flexible bronchoscopy N/A 12/24/2014    Procedure: FLEXIBLE BRONCHOSCOPY;  Surgeon: Allyne Gee, MD;  Location: [facility] ORS;  Service: Pulmonary;  Laterality: N/A;   Prior to Admission medications   Medication Sig Start Date End Date Taking? Authorizing Provider  aspirin EC 81 MG tablet 81 mg by PEG Tube route daily.    Yes Historical Provider, MD  citalopram (CELEXA) 20 MG tablet 20 mg by PEG Tube route daily.    Yes Historical Provider, MD  EPINEPHrine (EPIPEN 2-PAK) 0.3 mg/0.3 mL IJ SOAJ injection Inject 0.3 mg into the muscle once as needed (for severe allergic reaction).   Yes Historical Provider, MD  Fe Fum-FA-B Cmp-C-Zn-Mg-Mn-Cu (CENTRATEX) 106-1 MG CAPS 1 capsule  by PEG Tube route daily.    Yes Historical Provider, MD  ferrous sulfate 325 (65 FE) MG EC tablet 325 mg by PEG Tube route daily.    Yes Historical Provider, MD  folic acid (FOLVITE) 1 MG tablet 1 mg by PEG Tube route daily.    Yes Historical Provider, MD  guaiFENesin (MUCINEX) 600 MG 12 hr tablet Take 600 mg by mouth 2 (two) times daily.   Yes Historical Provider, MD  guaifenesin (ROBITUSSIN) 100 MG/5ML syrup Take 400 mg by mouth every 4 (four) hours as needed for cough.   Yes Historical Provider, MD  HYDROcodone-acetaminophen (NORCO/VICODIN) 5-325 MG per tablet 1 tablet by PEG Tube route 3  (three) times daily as needed for moderate pain.    Yes Historical Provider, MD  ipratropium-albuterol (DUONEB) 0.5-2.5 (3) MG/3ML SOLN Take 3 mLs by nebulization every 6 (six) hours as needed (for shortness of breath).   Yes Historical Provider, MD  loratadine (CLARITIN) 10 MG tablet 10 mg by PEG Tube route daily.    Yes Historical Provider, MD  LORazepam (ATIVAN) 0.5 MG tablet 0.5 mg by PEG Tube route 4 (four) times daily as needed for anxiety.    Yes Historical Provider, MD  montelukast (SINGULAIR) 10 MG tablet 10 mg by PEG Tube route at bedtime.    Yes Historical Provider, MD  Multiple Vitamin (MULTIVITAMIN WITH MINERALS) TABS tablet 1 tablet by PEG Tube route daily.    Yes Historical Provider, MD  pantoprazole (PROTONIX) 40 MG tablet 40 mg by PEG Tube route 2 (two) times daily.    Yes Historical Provider, MD  sulfamethoxazole-trimethoprim (BACTRIM DS,SEPTRA DS) 800-160 MG tablet 1 tablet by PEG Tube route 2 (two) times daily.  04/21/15 05/01/15 Yes Historical Provider, MD  tamsulosin (FLOMAX) 0.4 MG CAPS capsule 0.4 mg by PEG Tube route daily.    Yes Historical Provider, MD  zolpidem (AMBIEN) 5 MG tablet 5 mg by PEG Tube route at bedtime as needed for sleep.    Yes Historical Provider, MD  amoxicillin-clavulanate (AUGMENTIN) 500-125 MG tablet Take 1 tablet (500 mg total) by mouth 2 (two) times daily. Patient not taking: Reported on 04/21/2015 04/09/15   Henreitta Leber, MD  furosemide (LASIX) 20 MG tablet Take 1 tablet (20 mg total) by mouth daily. Patient not taking: Reported on 04/06/2015 11/06/14 11/06/15  Lisa Roca, MD  predniSONE (DELTASONE) 10 MG tablet Take 1 tablet (10 mg total) by mouth daily with breakfast. Patient not taking: Reported on 04/21/2015 04/09/15   Henreitta Leber, MD   Allergies  Allergen Reactions  . Morphine And Related Other (See Comments)    Reaction:  Altered mental status     FAMILY HISTORY   Family History  Problem Relation Age of Onset  . Diabetes  Brother       SOCIAL HISTORY    reports that he quit smoking about 30 years ago. His smoking use included Cigarettes. He has a 60 pack-year smoking history. He does not have any smokeless tobacco history on file. He reports that he does not drink alcohol or use illicit drugs.  Review of Systems  Constitutional: Positive for chills and diaphoresis. Negative for fever.  HENT: Negative for hearing loss and tinnitus.   Eyes: Negative for blurred vision and photophobia.  Respiratory: Positive for cough, sputum production, shortness of breath and wheezing.   Cardiovascular: Positive for chest pain. Negative for palpitations.  Gastrointestinal: Positive for nausea and vomiting. Negative for heartburn and abdominal pain.  Genitourinary: Negative  for dysuria and urgency.  Musculoskeletal: Negative for myalgias.  Neurological: Positive for weakness. Negative for dizziness, tingling and headaches.      VITAL SIGNS    Temp:  [95.4 F (35.2 C)-98.8 F (37.1 C)] 96.1 F (35.6 C) (10/26 1100) Pulse Rate:  [39-95] 39 (10/26 1100) Resp:  [13-35] 14 (10/26 1100) BP: (83-158)/(39-72) 115/42 mmHg (10/26 1100) SpO2:  [88 %-100 %] 100 % (10/26 1100) FiO2 (%):  [35 %-50 %] 50 % (10/26 0818) Weight:  [173 lb 12.8 oz (78.835 kg)] 173 lb 12.8 oz (78.835 kg) (10/25 1734) HEMODYNAMICS:   VENTILATOR SETTINGS: Vent Mode:  [-]  FiO2 (%):  [35 %-50 %] 50 % INTAKE / OUTPUT:  Intake/Output Summary (Last 24 hours) at 04/22/15 1124 Last data filed at 04/22/15 5366  Gross per 24 hour  Intake    129 ml  Output    550 ml  Net   -421 ml       PHYSICAL EXAM   Physical Exam  HENT:  Head: Normocephalic and atraumatic.  Right Ear: External ear normal.  Left Ear: External ear normal.  Eyes: Pupils are equal, round, and reactive to light.  Neck: Normal range of motion. Neck supple.  Tracheostomy site clean, no purulent drainage  Cardiovascular: Normal rate and normal heart sounds.    Pulmonary/Chest:  No significant tachypnea, coarse upper airway sounds, decreased bilateral lower breath sounds, thick secretions noted from trach site, yellow in nature  Abdominal: Soft.  G-tube in place with no erythema or drainage  Musculoskeletal: Normal range of motion.  Neurological: He is alert.  Skin: Skin is warm. He is diaphoretic.  Nursing note and vitals reviewed.      LABS   LABS:  CBC  Recent Labs Lab 04/21/15 1742 04/22/15 0556  WBC 13.9* 12.0*  HGB 8.2* 8.7*  HCT 27.9* 28.8*  PLT 240 232   Coag's No results for input(s): APTT, INR in the last 168 hours. BMET  Recent Labs Lab 04/21/15 1742 04/22/15 0556  NA 133* 136  K 4.7 4.5  CL 93* 94*  CO2 31 36*  BUN 24* 20  CREATININE 0.68 0.69  GLUCOSE 183* 225*   Electrolytes  Recent Labs Lab 04/21/15 1742 04/22/15 0556  CALCIUM 8.6* 8.9   Sepsis Markers No results for input(s): LATICACIDVEN, PROCALCITON, O2SATVEN in the last 168 hours. ABG  Recent Labs Lab 04/22/15 0430  PHART 7.44  PCO2ART 54*  PO2ART 60*   Liver Enzymes No results for input(s): AST, ALT, ALKPHOS, BILITOT, ALBUMIN in the last 168 hours. Cardiac Enzymes  Recent Labs Lab 04/21/15 1742  TROPONINI <0.03   Glucose No results for input(s): GLUCAP in the last 168 hours.   Recent Results (from the past 240 hour(s))  Culture, expectorated sputum-assessment     Status: None (Preliminary result)   Collection Time: 04/22/15 12:40 AM  Result Value Ref Range Status   Specimen Description SPUTUM  Final   Special Requests Normal  Final   Sputum evaluation THIS SPECIMEN IS ACCEPTABLE FOR SPUTUM CULTURE  Final   Report Status PENDING  Incomplete     Current facility-administered medications:  .  0.9 %  sodium chloride infusion, , Intravenous, Continuous, Gladstone Lighter, MD, Last Rate: 60 mL/hr at 04/21/15 2131 .  acetaminophen (TYLENOL) tablet 650 mg, 650 mg, Oral, Q6H PRN **OR** acetaminophen (TYLENOL) suppository  650 mg, 650 mg, Rectal, Q6H PRN, Gladstone Lighter, MD .  acetylcysteine (MUCOMYST) 20 % nebulizer / oral solution 600 mg, 600 mg,  Nebulization, BID, Rod Majerus, MD .  antiseptic oral rinse (CPC / CETYLPYRIDINIUM CHLORIDE 0.05%) solution 7 mL, 7 mL, Mouth Rinse, q12n4p, Henreitta Leber, MD, 7 mL at 04/22/15 1110 .  aspirin EC tablet 81 mg, 81 mg, Oral, Daily, Gladstone Lighter, MD, 81 mg at 04/22/15 0905 .  chlorhexidine (PERIDEX) 0.12 % solution 15 mL, 15 mL, Mouth Rinse, BID, Henreitta Leber, MD, 15 mL at 04/22/15 0906 .  Chlorhexidine Gluconate Cloth 2 % PADS 6 each, 6 each, Topical, Q0600, Henreitta Leber, MD, 6 each at 04/22/15 0600 .  citalopram (CELEXA) tablet 20 mg, 20 mg, Per Tube, Daily, Gladstone Lighter, MD, 20 mg at 04/22/15 0906 .  enoxaparin (LOVENOX) injection 40 mg, 40 mg, Subcutaneous, Q24H, Gladstone Lighter, MD, 40 mg at 04/21/15 2145 .  ferrous sulfate tablet 325 mg, 325 mg, Oral, Daily, Gladstone Lighter, MD, 325 mg at 04/22/15 0905 .  folic acid (FOLVITE) tablet 1 mg, 1 mg, Per Tube, Daily, Gladstone Lighter, MD, 1 mg at 04/22/15 0905 .  guaiFENesin (ROBITUSSIN) 100 MG/5ML solution 400 mg, 400 mg, Oral, Q4H PRN, Gladstone Lighter, MD, 400 mg at 04/22/15 0906 .  ipratropium-albuterol (DUONEB) 0.5-2.5 (3) MG/3ML nebulizer solution 3 mL, 3 mL, Nebulization, Q4H, Gladstone Lighter, MD, 3 mL at 04/22/15 0818 .  loratadine (CLARITIN) tablet 10 mg, 10 mg, Per Tube, Daily, Gladstone Lighter, MD, 10 mg at 04/22/15 0905 .  LORazepam (ATIVAN) tablet 0.5 mg, 0.5 mg, Per Tube, QID PRN, Gladstone Lighter, MD .  methylPREDNISolone sodium succinate (SOLU-MEDROL) 40 mg/mL injection 40 mg, 40 mg, Intravenous, 3 times per day, Gladstone Lighter, MD, 40 mg at 04/22/15 0443 .  montelukast (SINGULAIR) tablet 10 mg, 10 mg, Per Tube, QHS, Gladstone Lighter, MD, 10 mg at 04/21/15 2251 .  multivitamin with minerals tablet 1 tablet, 1 tablet, Oral, Daily, Gladstone Lighter, MD, 1 tablet at 04/22/15  0905 .  mupirocin ointment (BACTROBAN) 2 % 1 application, 1 application, Nasal, BID, Henreitta Leber, MD, 1 application at 38/18/29 0906 .  ondansetron (ZOFRAN) tablet 4 mg, 4 mg, Oral, Q6H PRN **OR** ondansetron (ZOFRAN) injection 4 mg, 4 mg, Intravenous, Q6H PRN, Gladstone Lighter, MD .  pantoprazole (PROTONIX) EC tablet 40 mg, 40 mg, Oral, BID, Gladstone Lighter, MD, 40 mg at 04/22/15 0905 .  piperacillin-tazobactam (ZOSYN) IVPB 3.375 g, 3.375 g, Intravenous, 3 times per day, Henreitta Leber, MD, 3.375 g at 04/22/15 0443 .  polyethylene glycol (MIRALAX / GLYCOLAX) packet 17 g, 17 g, Oral, Daily PRN, Gladstone Lighter, MD .  sodium chloride 0.9 % injection 3 mL, 3 mL, Intravenous, Q12H, Gladstone Lighter, MD, 3 mL at 04/22/15 0907 .  tamsulosin (FLOMAX) capsule 0.4 mg, 0.4 mg, Oral, Daily, Gladstone Lighter, MD, 0.4 mg at 04/22/15 0905 .  vancomycin (VANCOCIN) IVPB 1000 mg/200 mL premix, 1,000 mg, Intravenous, Q12H, Henreitta Leber, MD, 1,000 mg at 04/22/15 0054 .  zolpidem (AMBIEN) tablet 5 mg, 5 mg, Oral, QHS PRN, Lytle Butte, MD, 5 mg at 04/21/15 2251  IMAGING    Dg Chest 1 View  04/21/2015  CLINICAL DATA:  increased SOB and thick secretions in trach that wife was unable to suction. Pt states what has come out was thick and yellow. Pt is normally on room air EXAM: CHEST  1 VIEW COMPARISON:  04/06/2015 FINDINGS: Tracheostomy remains in place. Low lung volumes with crowding of bronchovascular structures particularly around the hila. Masslike opacity in the left lower lung as before. Right infrahilar airspace opacity somewhat less  well demarcated than on previous exam. Airspace opacities extend laterally at the right lung base. Vascular clips overlie the right hilum. Heart size upper limits normal for technique. Atheromatous aorta. No pneumothorax. Blunting of the left lateral costophrenic angle suggesting small effusion. Visualized skeletal structures are unremarkable. IMPRESSION: 1. Progressive  right lower lung airspace disease possibly pneumonia. 2. Persistent left lower lobe lung masslike opacity. CT may be useful to differentiate pneumonia from neoplasm. Electronically Signed   By: Lucrezia Europe M.D.   On: 04/21/2015 18:08   Ct Chest W Contrast  04/21/2015  CLINICAL DATA:  81 year old male with increasing shortness of breath and thick tracheal secretions. EXAM: CT CHEST WITH CONTRAST TECHNIQUE: Multidetector CT imaging of the chest was performed during intravenous contrast administration. CONTRAST:  21mL OMNIPAQUE IOHEXOL 300 MG/ML  SOLN COMPARISON:  Chest CT 02/22/2013. FINDINGS: Mediastinum/Lymph Nodes: Tracheostomy tube in position with tip approximately 2.2 cm above the carina. Heart size is borderline enlarged. There is no significant pericardial fluid, thickening or pericardial calcification. There is atherosclerosis of the thoracic aorta, the great vessels of the mediastinum and the coronary arteries, including calcified atherosclerotic plaque in the left main, left anterior descending, left circumflex and right coronary arteries. Severe calcifications of the aortic valve. Mild calcifications of the mitral annulus. Right hilar lymphadenopathy measuring up to 1.4 cm in short axis. Left hilar lymphadenopathy measuring up to 12 mm in short axis. Multiple borderline enlarged mediastinal lymph nodes measuring up to 9 mm in short axis in the low right paratracheal station. Postoperative changes from apparent partial esophagectomy and gastric pull-through. No axillary lymphadenopathy. Lungs/Pleura: Status post right upper lobectomy. Large bilateral lower lobe masses. The right lower lobe mass measures up to 11.1 x 7.8 cm (image 39 of series 2) and appears to have extensive internal low-attenuation, which may suggest internal areas of necrosis. The left lower lobe mass measures up to 6.5 x 7.8 cm (image 38 of series 2). Both of these lesions are associated with some distal postobstructive changes,  likely postobstructive pneumonitis. A few other scattered tiny pulmonary nodules are noted throughout the lungs bilaterally, nonspecific. Small right pleural effusion lying dependently. Upper Abdomen: Partially calcified gallstones lying dependently in the gallbladder. Incompletely visualize 9 mm calculus in the lower pole collecting system of the right kidney. Numerous surgical clips throughout the upper abdomen. Percutaneous gastrostomy tube with tip in the distal body of the stomach. Extensive atherosclerosis. Musculoskeletal/Soft Tissues: Multiple old healed right-sided rib fractures with posttraumatic deformity in the right chest wall. There are no aggressive appearing lytic or blastic lesions noted in the visualized portions of the skeleton. IMPRESSION: 1. Large bilateral lower lobe mass is concerning for either metastatic disease or synchronous primary bronchogenic neoplasms. These have increased significantly in size compared to prior CT of the abdomen and pelvis 06/14/2014. New small right pleural effusion is likely malignant. Mild postobstructive pneumonitis within the lower lobes of the lungs bilaterally. Bilateral hilar lymphadenopathy is also noted. 2. Cholelithiasis without evidence of acute cholecystitis at this time. 3. 9 mm nonobstructive calculus in the lower pole of the right kidney. 4. There are calcifications of the aortic valve (severe) and mitral annulus. Echocardiographic correlation for evaluation of potential valvular dysfunction may be warranted if clinically indicated. Electronically Signed   By: Vinnie Langton M.D.   On: 04/21/2015 21:18      Indwelling Urinary Catheter continued, requirement due to   Reason to continue Indwelling Urinary Catheter for strict Intake/Output monitoring for hemodynamic instability   Central Line  continued, requirement due to   Reason to continue [employer] Monitoring of central venous pressure or other hemodynamic parameters   Ventilator  continued, requirement due to, resp failure    Ventilator Sedation RASS 0 to -2   Lines:  Cultures: Sputum 10/26>>   Antibiotics: Vanc 10/25>> Zosyn 10/25>>   ASSESSMENT/PLAN  81 y.o. male with a known history of chronic respiratory failure on trach collar, COPD, history of laryngeal cancer, prostate cancer currently on injections presents to the hospital from home secondary to respiratory distress and difficulty breathing.  Acute respiratory failure- secondary to worsening pneumonia-healthcare acquired pneumonia/multilobar pneumonia with possible necrosis. -Continue with antibiotics -Continue with gentle suctioning -Masses seen on chest CT are most likely multilobar pneumonia with rounded atelectasis, plan for trach aspirate culture and will attempt bronchoscopy. -Recent admission for pneumonia, sputum culture at that time mostly with yeast/fungus. -Currently holding saturations with trach collar in place. -Continue with bronchodilators and Mucomyst -Patient has chronic tracheostomy, just started on trach collar 2 weeks ago at the time   COPD-with acute exacerbation, wheezing on exam. -Duo-nebs, Solu-Medrol IV at this time.   prostate cancer-following with urologist. Has adenocarcinoma of the prostate -Received six-month Lupron injection in August 2016 -Continue outpatient follow-up  Barrett's esophagus-continue outpatient monitoring. -On Protonix twice a day  I have personally obtained a history, examined the patient, evaluated laboratory and imaging results, formulated the assessment and plan and placed orders.  The Patient requires high complexity decision making for assessment and support, frequent evaluation and titration of therapies, application of advanced monitoring technologies and extensive interpretation of multiple databases. Critical Care Time devoted to patient care services described in this note is 45 minutes.   Overall, patient is critically ill, prognosis  is guarded. Patient at high risk for cardiac arrest and death.   Vilinda Boehringer, MD [facility] Pager [phone] (please enter [phone]-[phone]) On Call Pager - [phone] (please enter [phone]-[phone])     04/22/2015, 11:24 AM

## 2015-04-22 NOTE — Progress Notes (Signed)
ANTIBIOTIC CONSULT NOTE - INITIAL  Pharmacy Consult for fluconazole  Indication: rule out pneumonia/bronchitis  Allergies  Allergen Reactions  . Morphine And Related Other (See Comments)    Reaction:  Altered mental status    Patient Measurements: Height: 5\' 11"  (180.3 cm) Weight: 173 lb 12.8 oz (78.835 kg) IBW/kg (Calculated) : 75.3  Vital Signs: Temp: 96.6 F (35.9 C) (10/26 1600) BP: 124/50 mmHg (10/26 1600) Pulse Rate: 79 (10/26 1600) Intake/Output from previous day: 10/25 0701 - 10/26 0700 In: 29 [I.V.:29] Out: 450 [Urine:450] Intake/Output from this shift: Total I/O In: 600 [Other:100; IV Piggyback:500] Out: 100 [Urine:100]  Labs:  Recent Labs  04/21/15 1742 04/22/15 0556  WBC 13.9* 12.0*  HGB 8.2* 8.7*  PLT 240 232  CREATININE 0.68 0.69   Estimated Creatinine Clearance: 77.1 mL/min (by C-G formula based on Cr of 0.69). No results for input(s): VANCOTROUGH, VANCOPEAK, VANCORANDOM, GENTTROUGH, GENTPEAK, GENTRANDOM, TOBRATROUGH, TOBRAPEAK, TOBRARND, AMIKACINPEAK, AMIKACINTROU, AMIKACIN in the last 72 hours.   Microbiology: Recent Results (from the past 720 hour(s))  Culture, blood (routine x 2)     Status: None   Collection Time: 04/06/15  4:11 PM  Result Value Ref Range Status   Specimen Description BLOOD RIGHT ANTECUBITAL  Final   Special Requests BOTTLES DRAWN AEROBIC AND ANAEROBIC 10ML  Final   Culture NO GROWTH 5 DAYS  Final   Report Status 04/11/2015 FINAL  Final  Culture, blood (routine x 2)     Status: None   Collection Time: 04/06/15  4:11 PM  Result Value Ref Range Status   Specimen Description BLOOD LEFT  Final   Special Requests BOTTLES DRAWN AEROBIC AND ANAEROBIC 20ML  Final   Culture NO GROWTH 5 DAYS  Final   Report Status 04/11/2015 FINAL  Final  Culture, expectorated sputum-assessment     Status: None   Collection Time: 04/07/15  6:25 AM  Result Value Ref Range Status   Specimen Description SPUTUM  Final   Special Requests  Immunocompromised  Final   Sputum evaluation THIS SPECIMEN IS ACCEPTABLE FOR SPUTUM CULTURE  Final   Report Status 04/07/2015 FINAL  Final  Culture, respiratory (NON-Expectorated)     Status: None   Collection Time: 04/07/15  6:25 AM  Result Value Ref Range Status   Specimen Description SPUTUM  Final   Special Requests Immunocompromised Reflexed from [id number]  Final   Gram Stain   Final    MODERATE WBC SEEN MANY YEAST MANY GRAM POSITIVE RODS GOOD SPECIMEN - 80-90% WBCS    Culture   Final    HEAVY GROWTH CANDIDA TROPICALIS MODERATE GROWTH CANDIDA ALBICANS RARE ACINETOBACTER LWOFFII    Report Status 04/12/2015 FINAL  Final   Organism ID, Bacteria ACINETOBACTER LWOFFII  Final      Susceptibility   Acinetobacter lwoffii - MIC*    CEFTRIAXONE Value in next row Intermediate      INTERMEDIATE16    CIPROFLOXACIN Value in next row Resistant      RESISTANT>=4    GENTAMICIN Value in next row Sensitive      SENSITIVE<=1    IMIPENEM Value in next row Sensitive      SENSITIVE<=0.25    PIP/TAZO Value in next row Resistant      RESISTANT>=128    TRIMETH/SULFA Value in next row Sensitive      SENSITIVE<=20    CEFEPIME Value in next row Sensitive      SENSITIVE2    AMPICILLIN/SULBACTAM Value in next row Sensitive  SENSITIVE<=2    * RARE ACINETOBACTER LWOFFII  Culture, expectorated sputum-assessment     Status: None (Preliminary result)   Collection Time: 04/22/15 12:40 AM  Result Value Ref Range Status   Specimen Description SPUTUM  Final   Special Requests Normal  Final   Sputum evaluation THIS SPECIMEN IS ACCEPTABLE FOR SPUTUM CULTURE  Final   Report Status PENDING  Incomplete    Medical History: Past Medical History  Diagnosis Date  . Cancer (HCC)     laryngeal cancer, prostrate cancer  . COPD (chronic obstructive pulmonary disease) ([place])   . Coronary artery disease   . Stroke [facility])     no residual weakness  . Depression   . Sleep apnea   . MRSA pneumonia ([place])   .  Barrett's esophagus   . BPH (benign prostatic hyperplasia)   . Nephrolithiasis   . Osteoarthritis   . Anemia     Medications:  Anti-infectives    Start     Dose/Rate Route Frequency Ordered Stop   04/22/15 1800  fluconazole (DIFLUCAN) IVPB 400 mg     400 mg 100 mL/hr over 120 Minutes Intravenous Every 24 hours 04/22/15 1639     04/22/15 1730  fluconazole (DIFLUCAN) IVPB 400 mg  Status:  Discontinued     400 mg 100 mL/hr over 120 Minutes Intravenous Every 24 hours 04/22/15 1638 04/22/15 1639   04/22/15 0600  piperacillin-tazobactam (ZOSYN) IVPB 3.375 g  Status:  Discontinued     3.375 g 12.5 mL/hr over 240 Minutes Intravenous 3 times per day 04/21/15 1904 04/21/15 2144   04/22/15 0400  piperacillin-tazobactam (ZOSYN) IVPB 3.375 g     3.375 g 12.5 mL/hr over 240 Minutes Intravenous 3 times per day 04/21/15 2206     04/22/15 0100  vancomycin (VANCOCIN) IVPB 1000 mg/200 mL premix  Status:  Discontinued     1,000 mg 200 mL/hr over 60 Minutes Intravenous Every 12 hours 04/21/15 1856 04/21/15 2144   04/22/15 0100  vancomycin (VANCOCIN) IVPB 1000 mg/200 mL premix     1,000 mg 200 mL/hr over 60 Minutes Intravenous Every 12 hours 04/21/15 2205     04/21/15 2200  piperacillin-tazobactam (ZOSYN) IVPB 3.375 g  Status:  Discontinued     3.375 g 100 mL/hr over 30 Minutes Intravenous 3 times per day 04/21/15 1848 04/21/15 1903   04/21/15 2000  piperacillin-tazobactam (ZOSYN) IVPB 3.375 g     3.375 g 100 mL/hr over 30 Minutes Intravenous  Once 04/21/15 1903 04/21/15 2007   04/21/15 1900  vancomycin (VANCOCIN) IVPB 1000 mg/200 mL premix     1,000 mg 200 mL/hr over 60 Minutes Intravenous  Once 04/21/15 1848 04/21/15 2043     Assessment: Pharmacy consulted to dose fluconazole in this 81 year old male in the ICU with acute respiratory failure. Patient is currently on day #2 of vancomycin and piperacillin/tazobactam. Patient has had cultures in the past which grew candida.   Plan:  Follow up  culture results  Started fluconazole 400 mg IV daily, follow up with culture results.  Darylene Price Deondray Ospina 04/22/2015,4:41 PM

## 2015-04-22 NOTE — Progress Notes (Signed)
   04/22/15 1600  Clinical Encounter Type  Visited With Patient and family together  Visit Type Initial  Consult/Referral To Other (Comment)  Spiritual Encounters  Spiritual Needs Emotional  Stress Factors  Patient Stress Factors None identified  Family Stress Factors Health changes  Advance Directives (For Healthcare)  Does patient want to make changes to advanced directive? Yes - information given  Chaplain went in to visit with patient and family. Wanted to check in to see how they were feeling.and see if I could be of service. When I introduced self the patient's spouse inquired about a Living Will. I exited the room and returned with two copies and proceeded to give education regarding the HCPOA. The wife was appreciative for the information. She will let me know if she would like to proceed. Chaplain Remiel Corti A. Mahiya Kercheval Ext. [phone]

## 2015-04-22 NOTE — Procedures (Signed)
Date: 04/22/2015, @TIME     PHYSICIAN:  Abdulrahman Bracey  Indications/Preliminary Diagnosis: Pneumonia - bilateral lung masses   Consent: (Place X beside choice/s below)  The benefits, risks and possible complications of the procedure were        explained to:  __x_ patient  ___x patient's family  ___ other:___________  who verbalized understanding and gave:  ___ verbal  ___ written  __x_ verbal and written  ___ telephone  ___ other:________ consent.      Unable to obtain consent; procedure performed on emergent basis.     Other:       PRESEDATION ASSESSMENT: History and Physical has been performed. Patient meds and allergies have been reviewed. Presedation airway examination has been performed and documented. Baseline vital signs, sedation score, oxygenation status, and cardiac rhythm were reviewed. Patient was deemed to be in satisfactory condition to undergo the procedure.  PREMEDICATIONS:   Sedative/Narcotic Amt Dose   Versed 0.5 mg   Fentanyl 50 mcg  Diprivan  mg        Airway Prep (Place X beside choice below)   1% Transtracheal Lidocaine Anesthetization 7 cc   Patient prepped per Bronchoscopy Lab Policy       Insertion Route (Place X beside choice below)   Nasal   Oral   Endotracheal Tube  x Tracheostomy   INTRAPROCEDURE MEDICATIONS:  Sedative/Narcotic Amt Dose   Versed  mg   Fentanyl  mcg  Diprivan  mg       Medication Amt Dose  Medication Amt Dose  Xylocaine 2%  cc  Epinephrine 1:10,000 sol  cc  Xylocaine 4%  cc  Cocaine  cc   TECHNICAL PROCEDURES: (Place X beside choice below)   Procedures  Description    None     Electrocautery     Cryotherapy     Balloon Dilatation     Bronchography     Stent Placement   x  Therapeutic Aspiration     Laser/Argon Plasma    Brachytherapy Catheter Placement    Foreign Body Removal     SPECIMENS (Sites): (Place X beside choice below)  Specimens Description   No Specimens Obtained     Washings   x Lavage RLL    Biopsies    Fine Needle Aspirates    Brushings    Sputum    FINDINGS: , Mucus secretions noted throughout entire lung examination. See below  ESTIMATED BLOOD LOSS: None  COMPLICATIONS/RESOLUTION: None  PROCEDURE DETAILS: Timeout performed and correct patient, name, & ID confirmed. Following prep per Pulmonary policy, appropriate sedation was administered.  Airway exam proceeded with findings, technical procedures, and specimen collection as noted below. At the end of exam the scope was withdrawn without incident. Impression and Plan as noted below.   Inspection: Right lung-status post right upper lobectomy, distorted right upper lobe anatomy, malrotated orifice to the right middle lobe with significant mucus plugging to the orifice of the right middle lobe. Mucous thick, purulent appearance, yellowish, multiple aspiration attempts performed. Small right middle lobe orifice easily plugged by mucus, but cleared with coughing.  Left lung-thick mucous secretions throughout left lung inspection, most predominately noted in the left lower lobe. Mucous secretions were white to yellow, thick, purulent.  Procedure: BAL of right lower lobe-micro-, fungal, AFB culture sent  IMPLANTED DEVICE(S): None  IMPRESSION:POST-PROCEDURE DX: Pneumonia -Multiple thick yellow secretions noted throughout entire examination of lung, BAL of right lower lobe performed. Await fungal, microbiology, AFB results.  RECOMMENDATION/PLAN: Follow BAL results  ADDITIONAL COMMENTS: None   Procedure Time: 20 minutes   Vilinda Boehringer, MD [facility] Pager : [phone] (Please enter 7 digits)

## 2015-04-22 NOTE — Progress Notes (Signed)
[facility]  Patient Status: active  [facility] is providing the following services: SN/PT/MSW  If patient discharges after hours, please call [phone].   Stephen Taylor 04/22/2015, 3:58 PM

## 2015-04-23 LAB — BASIC METABOLIC PANEL
Anion gap: 9 (ref 5–15)
BUN: 30 mg/dL — ABNORMAL HIGH (ref 6–20)
CHLORIDE: 96 mmol/L — AB (ref 101–111)
CO2: 35 mmol/L — ABNORMAL HIGH (ref 22–32)
CREATININE: 0.91 mg/dL (ref 0.61–1.24)
Calcium: 9.5 mg/dL (ref 8.9–10.3)
Glucose, Bld: 371 mg/dL — ABNORMAL HIGH (ref 65–99)
POTASSIUM: 4.9 mmol/L (ref 3.5–5.1)
SODIUM: 140 mmol/L (ref 135–145)

## 2015-04-23 LAB — EXPECTORATED SPUTUM ASSESSMENT W GRAM STAIN, RFLX TO RESP C: Special Requests: NORMAL

## 2015-04-23 LAB — GLUCOSE, CAPILLARY
Glucose-Capillary: 178 mg/dL — ABNORMAL HIGH (ref 65–99)
Glucose-Capillary: 179 mg/dL — ABNORMAL HIGH (ref 65–99)
Glucose-Capillary: 210 mg/dL — ABNORMAL HIGH (ref 65–99)

## 2015-04-23 LAB — CBC
HCT: 30.2 % — ABNORMAL LOW (ref 40.0–52.0)
HEMOGLOBIN: 8.8 g/dL — AB (ref 13.0–18.0)
MCH: 20.4 pg — ABNORMAL LOW (ref 26.0–34.0)
MCHC: 29.2 g/dL — ABNORMAL LOW (ref 32.0–36.0)
MCV: 70.1 fL — ABNORMAL LOW (ref 80.0–100.0)
PLATELETS: 355 10*3/uL (ref 150–440)
RBC: 4.31 MIL/uL — AB (ref 4.40–5.90)
RDW: 19.6 % — ABNORMAL HIGH (ref 11.5–14.5)
WBC: 32.7 10*3/uL — ABNORMAL HIGH (ref 3.8–10.6)

## 2015-04-23 LAB — VANCOMYCIN, TROUGH: VANCOMYCIN TR: 14 ug/mL (ref 10–20)

## 2015-04-23 LAB — EXPECTORATED SPUTUM ASSESSMENT W REFEX TO RESP CULTURE

## 2015-04-23 MED ORDER — METRONIDAZOLE IN NACL 5-0.79 MG/ML-% IV SOLN
500.0000 mg | Freq: Three times a day (TID) | INTRAVENOUS | Status: DC
  Administered 2015-04-23 (×2): 500 mg via INTRAVENOUS
  Filled 2015-04-23 (×5): qty 100

## 2015-04-23 MED ORDER — INSULIN ASPART 100 UNIT/ML ~~LOC~~ SOLN
0.0000 [IU] | Freq: Three times a day (TID) | SUBCUTANEOUS | Status: DC
  Administered 2015-04-23 (×2): 2 [IU] via SUBCUTANEOUS
  Administered 2015-04-24 (×2): 3 [IU] via SUBCUTANEOUS
  Administered 2015-04-25 – 2015-04-26 (×2): 2 [IU] via SUBCUTANEOUS
  Administered 2015-04-26: 3 [IU] via SUBCUTANEOUS
  Administered 2015-04-27 (×2): 2 [IU] via SUBCUTANEOUS
  Filled 2015-04-23: qty 2
  Filled 2015-04-23: qty 3
  Filled 2015-04-23 (×4): qty 2
  Filled 2015-04-23 (×3): qty 3

## 2015-04-23 MED ORDER — METHYLPREDNISOLONE SODIUM SUCC 40 MG IJ SOLR
40.0000 mg | Freq: Two times a day (BID) | INTRAMUSCULAR | Status: DC
  Administered 2015-04-23 – 2015-04-24 (×2): 40 mg via INTRAVENOUS
  Filled 2015-04-23 (×2): qty 1

## 2015-04-23 MED ORDER — VANCOMYCIN HCL 10 G IV SOLR
1250.0000 mg | Freq: Two times a day (BID) | INTRAVENOUS | Status: DC
  Administered 2015-04-24: 1250 mg via INTRAVENOUS
  Filled 2015-04-23 (×2): qty 1250

## 2015-04-23 MED ORDER — SODIUM CHLORIDE 0.9 % IV SOLN
1.0000 g | Freq: Three times a day (TID) | INTRAVENOUS | Status: DC
  Administered 2015-04-23 – 2015-04-24 (×3): 1 g via INTRAVENOUS
  Filled 2015-04-23 (×5): qty 1

## 2015-04-23 NOTE — Progress Notes (Signed)
   04/23/15 1300  Clinical Encounter Type  Visited With Patient  Visit Type Code  Referral From Nurse  Consult/Referral To Chaplain  Responded to Rapid Response on unit.  Pt was responsive and appeared to be doing better when I arrived.  No family present, nursing staff said family had left but would be returning later.  Provided pastoral presence and support to staff and patient.  [place] [phone]

## 2015-04-23 NOTE — Progress Notes (Signed)
Patient transported to room 108 on 28% venti mask setup connected to trach collar.  Tolerated transport well, when arrived to room 108 RT placed patient back on 30% ATC setup.  Will continue to monitor

## 2015-04-23 NOTE — Consult Note (Signed)
PULMONARY / CRITICAL CARE MEDICINE   Name: Stephen Taylor MRN: [id number] DOB: Jul 02, 1931    ADMISSION DATE:  04/21/2015 CONSULTATION DATE:  04/22/15  REFERRING MD :  Dr. Tressia Miners   CHIEF COMPLAINT:   Short of breath and increased secretions   HISTORY OF PRESENT ILLNESS per wife and patient    81 y.o. male with a known history of chronic respiratory failure on trach collar, COPD, history of laryngeal cancer, prostate cancer currently on injections, lung ca s\p RUL lobectomy, presents to the hospital from home with respiratory distress and difficulty breathing. Patient has a tracheostomy, on oxygen at this time and is unable to speak. His wife at bedside provides most of the history. According to the wife, patient had laryngeal cancer history and had radiation and tracheostomy done about 15 years ago. He was doing fine at home, on room air, even doing household activities up until 3 weeks ago. He was admitted to the hospital at that time for pneumonia. He was in the hospital and got treated with IV antibiotics and was discharged about 2 weeks ago on oral Augmentink sputum cultures only grew yeast at that time. He was requiring trach collar oxygen support at the time of discharge. For the first couple of days after discharge patient was doing fine. Over the last week he was having increasingly thick, yellow, mucus, wife had to suction him more often. He was noticed to have some chills yesterday, nauseated all day yesterday. Yesterday the suction machine at home was not able to suction the thick secretions, he was getting increasingly tachypneic and so was brought to the hospital. CT chest shows a right lower lobe mass and a left lower lobe mass, associated with some distal post obstructive changes, likely post obstructive pneumonitis/pneumonia, pulmonary/great care was consult for further recommendations   SUBJECTIVE: Patient doing well this morning, had bronchoscopy with BAL done yesterday.  Now sitting up in chair able to speak by covering tracheostomy site. No acute concerns.  SIGNIFICANT EVENTS  10/26- bronchoscopy, thick purulent secretions noted in bilateral lungs, BAL of right lower lobe performed  PAST MEDICAL HISTORY    :  Past Medical History  Diagnosis Date  . Cancer (HCC)     laryngeal cancer, prostrate cancer  . COPD (chronic obstructive pulmonary disease) ([place])   . Coronary artery disease   . Stroke [facility])     no residual weakness  . Depression   . Sleep apnea   . MRSA pneumonia ([place])   . Barrett's esophagus   . BPH (benign prostatic hyperplasia)   . Nephrolithiasis   . Osteoarthritis   . Anemia    Past Surgical History  Procedure Laterality Date  . Tracheostomy N/A   . Gastrostomy w/ feeding tube N/A   . Laminectomy    . Rotator cuff repair    . Lung removal, partial    . Esophageogastrectomy    . Peg tube placement    . Renal calculi    . Ureteral stent placement    . Flexible bronchoscopy N/A 12/24/2014    Procedure: FLEXIBLE BRONCHOSCOPY;  Surgeon: Allyne Gee, MD;  Location: [facility] ORS;  Service: Pulmonary;  Laterality: N/A;   Prior to Admission medications   Medication Sig Start Date End Date Taking? Authorizing Provider  aspirin EC 81 MG tablet 81 mg by PEG Tube route daily.    Yes Historical Provider, MD  citalopram (CELEXA) 20 MG tablet 20 mg by PEG Tube route daily.    Yes  Historical Provider, MD  EPINEPHrine (EPIPEN 2-PAK) 0.3 mg/0.3 mL IJ SOAJ injection Inject 0.3 mg into the muscle once as needed (for severe allergic reaction).   Yes Historical Provider, MD  Fe Fum-FA-B Cmp-C-Zn-Mg-Mn-Cu (CENTRATEX) 106-1 MG CAPS 1 capsule by PEG Tube route daily.    Yes Historical Provider, MD  ferrous sulfate 325 (65 FE) MG EC tablet 325 mg by PEG Tube route daily.    Yes Historical Provider, MD  folic acid (FOLVITE) 1 MG tablet 1 mg by PEG Tube route daily.    Yes Historical Provider, MD  guaiFENesin (MUCINEX) 600 MG 12 hr tablet Take 600 mg by  mouth 2 (two) times daily.   Yes Historical Provider, MD  guaifenesin (ROBITUSSIN) 100 MG/5ML syrup Take 400 mg by mouth every 4 (four) hours as needed for cough.   Yes Historical Provider, MD  HYDROcodone-acetaminophen (NORCO/VICODIN) 5-325 MG per tablet 1 tablet by PEG Tube route 3 (three) times daily as needed for moderate pain.    Yes Historical Provider, MD  ipratropium-albuterol (DUONEB) 0.5-2.5 (3) MG/3ML SOLN Take 3 mLs by nebulization every 6 (six) hours as needed (for shortness of breath).   Yes Historical Provider, MD  loratadine (CLARITIN) 10 MG tablet 10 mg by PEG Tube route daily.    Yes Historical Provider, MD  LORazepam (ATIVAN) 0.5 MG tablet 0.5 mg by PEG Tube route 4 (four) times daily as needed for anxiety.    Yes Historical Provider, MD  montelukast (SINGULAIR) 10 MG tablet 10 mg by PEG Tube route at bedtime.    Yes Historical Provider, MD  Multiple Vitamin (MULTIVITAMIN WITH MINERALS) TABS tablet 1 tablet by PEG Tube route daily.    Yes Historical Provider, MD  pantoprazole (PROTONIX) 40 MG tablet 40 mg by PEG Tube route 2 (two) times daily.    Yes Historical Provider, MD  sulfamethoxazole-trimethoprim (BACTRIM DS,SEPTRA DS) 800-160 MG tablet 1 tablet by PEG Tube route 2 (two) times daily.  04/21/15 05/01/15 Yes Historical Provider, MD  tamsulosin (FLOMAX) 0.4 MG CAPS capsule 0.4 mg by PEG Tube route daily.    Yes Historical Provider, MD  zolpidem (AMBIEN) 5 MG tablet 5 mg by PEG Tube route at bedtime as needed for sleep.    Yes Historical Provider, MD  amoxicillin-clavulanate (AUGMENTIN) 500-125 MG tablet Take 1 tablet (500 mg total) by mouth 2 (two) times daily. Patient not taking: Reported on 04/21/2015 04/09/15   Henreitta Leber, MD  furosemide (LASIX) 20 MG tablet Take 1 tablet (20 mg total) by mouth daily. Patient not taking: Reported on 04/06/2015 11/06/14 11/06/15  Lisa Roca, MD  predniSONE (DELTASONE) 10 MG tablet Take 1 tablet (10 mg total) by mouth daily with  breakfast. Patient not taking: Reported on 04/21/2015 04/09/15   Henreitta Leber, MD   Allergies  Allergen Reactions  . Morphine And Related Other (See Comments)    Reaction:  Altered mental status     FAMILY HISTORY   Family History  Problem Relation Age of Onset  . Diabetes Brother       SOCIAL HISTORY    reports that he quit smoking about 30 years ago. His smoking use included Cigarettes. He has a 60 pack-year smoking history. He does not have any smokeless tobacco history on file. He reports that he does not drink alcohol or use illicit drugs.  Review of Systems  Constitutional: Negative for fever, chills and diaphoresis.  HENT: Negative for hearing loss and tinnitus.   Eyes: Negative for blurred vision  and photophobia.  Respiratory: Positive for sputum production. Negative for cough, shortness of breath and wheezing.   Cardiovascular: Negative for chest pain and palpitations.  Gastrointestinal: Negative for heartburn, nausea, vomiting and abdominal pain.  Genitourinary: Negative for dysuria and urgency.  Musculoskeletal: Negative for myalgias.  Neurological: Negative for dizziness, tingling, weakness and headaches.      VITAL SIGNS    Temp:  [96 F (35.6 C)-98.1 F (36.7 C)] 96 F (35.6 C) (10/27 0904) Pulse Rate:  [70-93] 72 (10/27 0800) Resp:  [16-46] 20 (10/27 0800) BP: (120-166)/(43-127) 123/53 mmHg (10/27 0800) SpO2:  [95 %-100 %] 96 % (10/27 0800) FiO2 (%):  [35 %-50 %] 40 % (10/27 0726) HEMODYNAMICS:   VENTILATOR SETTINGS: Vent Mode:  [-]  FiO2 (%):  [35 %-50 %] 40 % INTAKE / OUTPUT:  Intake/Output Summary (Last 24 hours) at 04/23/15 1203 Last data filed at 04/23/15 0947  Gross per 24 hour  Intake   2050 ml  Output    600 ml  Net   1450 ml       PHYSICAL EXAM   Physical Exam  HENT:  Head: Normocephalic and atraumatic.  Right Ear: External ear normal.  Left Ear: External ear normal.  Eyes: Pupils are equal, round, and reactive to  light.  Neck: Normal range of motion. Neck supple.  Tracheostomy site clean, no purulent drainage  Cardiovascular: Normal rate and normal heart sounds.   Pulmonary/Chest:  No significant tachypnea - improving respiratory status from yesterday  Abdominal: Soft.  G-tube in place with no erythema or drainage  Musculoskeletal: Normal range of motion.  Neurological: He is alert.  Skin: Skin is warm.  Nursing note and vitals reviewed.      LABS   LABS:  CBC  Recent Labs Lab 04/21/15 1742 04/22/15 0556 04/23/15 0516  WBC 13.9* 12.0* 32.7*  HGB 8.2* 8.7* 8.8*  HCT 27.9* 28.8* 30.2*  PLT 240 232 355   Coag's No results for input(s): APTT, INR in the last 168 hours. BMET  Recent Labs Lab 04/21/15 1742 04/22/15 0556 04/23/15 0516  NA 133* 136 140  K 4.7 4.5 4.9  CL 93* 94* 96*  CO2 31 36* 35*  BUN 24* 20 30*  CREATININE 0.68 0.69 0.91  GLUCOSE 183* 225* 371*   Electrolytes  Recent Labs Lab 04/21/15 1742 04/22/15 0556 04/23/15 0516  CALCIUM 8.6* 8.9 9.5   Sepsis Markers No results for input(s): LATICACIDVEN, PROCALCITON, O2SATVEN in the last 168 hours. ABG  Recent Labs Lab 04/22/15 0430  PHART 7.44  PCO2ART 54*  PO2ART 60*   Liver Enzymes No results for input(s): AST, ALT, ALKPHOS, BILITOT, ALBUMIN in the last 168 hours. Cardiac Enzymes  Recent Labs Lab 04/21/15 1742  TROPONINI <0.03   Glucose  Recent Labs Lab 04/23/15 1117  GLUCAP 178*     Recent Results (from the past 240 hour(s))  Culture, expectorated sputum-assessment     Status: None   Collection Time: 04/22/15 12:40 AM  Result Value Ref Range Status   Specimen Description SPUTUM  Final   Special Requests Normal  Final   Sputum evaluation THIS SPECIMEN IS ACCEPTABLE FOR SPUTUM CULTURE  Final   Report Status 04/23/2015 FINAL  Final  Culture, respiratory (NON-Expectorated)     Status: None (Preliminary result)   Collection Time: 04/22/15 12:40 AM  Result Value Ref Range Status    Specimen Description SPUTUM  Final   Special Requests Normal Reflexed from [id number]  Final   Gram Stain PENDING  Incomplete   Culture   Final    HEAVY GROWTH GRAM NEGATIVE RODS IDENTIFICATION AND SUSCEPTIBILITIES TO FOLLOW ONCE ISOLATED    Report Status PENDING  Incomplete  Culture, bal-quantitative     Status: None (Preliminary result)   Collection Time: 04/22/15  3:14 PM  Result Value Ref Range Status   Specimen Description BRONCHIAL ALVEOLAR LAVAGE  Final   Special Requests Normal  Final   Gram Stain PENDING  Incomplete   Culture   Final    HEAVY GROWTH GRAM NEGATIVE RODS IDENTIFICATION AND SUSCEPTIBILITIES TO FOLLOW    Report Status PENDING  Incomplete  MRSA PCR Screening     Status: None   Collection Time: 04/22/15  5:23 PM  Result Value Ref Range Status   MRSA by PCR NEGATIVE NEGATIVE Final    Comment:        The GeneXpert MRSA Assay (FDA approved for NASAL specimens only), is one component of a comprehensive MRSA colonization surveillance program. It is not intended to diagnose MRSA infection nor to guide or monitor treatment for MRSA infections.      Current facility-administered medications:  .  acetaminophen (TYLENOL) tablet 650 mg, 650 mg, Oral, Q6H PRN **OR** acetaminophen (TYLENOL) suppository 650 mg, 650 mg, Rectal, Q6H PRN, Gladstone Lighter, MD .  acetylcysteine (MUCOMYST) 20 % nebulizer / oral solution 4 mL, 4 mL, Nebulization, BID, Chassie Pennix, MD, 4 mL at 04/23/15 0726 .  antiseptic oral rinse (CPC / CETYLPYRIDINIUM CHLORIDE 0.05%) solution 7 mL, 7 mL, Mouth Rinse, q12n4p, Henreitta Leber, MD, 7 mL at 04/23/15 1118 .  aspirin EC tablet 81 mg, 81 mg, Oral, Daily, Gladstone Lighter, MD, 81 mg at 04/23/15 0836 .  chlorhexidine (PERIDEX) 0.12 % solution 15 mL, 15 mL, Mouth Rinse, BID, Henreitta Leber, MD, 15 mL at 04/23/15 0835 .  Chlorhexidine Gluconate Cloth 2 % PADS 6 each, 6 each, Topical, Q0600, Henreitta Leber, MD, 6 each at 04/23/15 0545 .   citalopram (CELEXA) tablet 20 mg, 20 mg, Per Tube, Daily, Gladstone Lighter, MD, 20 mg at 04/23/15 0837 .  enoxaparin (LOVENOX) injection 40 mg, 40 mg, Subcutaneous, Q24H, Gladstone Lighter, MD, 40 mg at 04/22/15 2159 .  feeding supplement (JEVITY 1.5 CAL/FIBER) liquid 1,000 mL, 1,000 mL, Per Tube, Continuous, Henreitta Leber, MD, Last Rate: 95 mL/hr at 04/22/15 1620, 1,000 mL at 04/22/15 1620 .  ferrous sulfate tablet 325 mg, 325 mg, Oral, Daily, Gladstone Lighter, MD, 325 mg at 04/23/15 0835 .  fluconazole (DIFLUCAN) IVPB 400 mg, 400 mg, Intravenous, Q24H, Henreitta Leber, MD, 400 mg at 04/22/15 1703 .  folic acid (FOLVITE) tablet 1 mg, 1 mg, Per Tube, Daily, Gladstone Lighter, MD, 1 mg at 04/23/15 0850 .  free water 250 mL, 250 mL, Per Tube, 3 times per day, Henreitta Leber, MD, 250 mL at 04/23/15 0545 .  guaiFENesin (ROBITUSSIN) 100 MG/5ML solution 400 mg, 400 mg, Oral, Q4H PRN, Gladstone Lighter, MD, 400 mg at 04/22/15 0906 .  insulin aspart (novoLOG) injection 0-9 Units, 0-9 Units, Subcutaneous, TID WC, Kalaysia Demonbreun, MD, 2 Units at 04/23/15 1139 .  ipratropium-albuterol (DUONEB) 0.5-2.5 (3) MG/3ML nebulizer solution 3 mL, 3 mL, Nebulization, Q4H, Gladstone Lighter, MD, 3 mL at 04/23/15 1132 .  loratadine (CLARITIN) tablet 10 mg, 10 mg, Per Tube, Daily, Gladstone Lighter, MD, 10 mg at 04/23/15 0837 .  LORazepam (ATIVAN) tablet 0.5 mg, 0.5 mg, Per Tube, QID PRN, Gladstone Lighter, MD .  methylPREDNISolone sodium succinate (SOLU-MEDROL) 40 mg/mL  injection 40 mg, 40 mg, Intravenous, 3 times per day, Gladstone Lighter, MD, 40 mg at 04/23/15 0544 .  montelukast (SINGULAIR) tablet 10 mg, 10 mg, Per Tube, QHS, Gladstone Lighter, MD, 10 mg at 04/22/15 2158 .  multivitamin with minerals tablet 1 tablet, 1 tablet, Oral, Daily, Gladstone Lighter, MD, 1 tablet at 04/23/15 0836 .  mupirocin ointment (BACTROBAN) 2 % 1 application, 1 application, Nasal, BID, Henreitta Leber, MD, 1 application at 29/51/88  2200 .  ondansetron (ZOFRAN) tablet 4 mg, 4 mg, Oral, Q6H PRN **OR** ondansetron (ZOFRAN) injection 4 mg, 4 mg, Intravenous, Q6H PRN, Gladstone Lighter, MD .  pantoprazole sodium (PROTONIX) 40 mg/20 mL oral suspension 40 mg, 40 mg, Per Tube, BID, Henreitta Leber, MD, 40 mg at 04/23/15 0835 .  piperacillin-tazobactam (ZOSYN) IVPB 3.375 g, 3.375 g, Intravenous, 3 times per day, Henreitta Leber, MD, 3.375 g at 04/23/15 0545 .  polyethylene glycol (MIRALAX / GLYCOLAX) packet 17 g, 17 g, Oral, Daily PRN, Gladstone Lighter, MD .  sodium chloride 0.9 % injection 3 mL, 3 mL, Intravenous, Q12H, Gladstone Lighter, MD, 3 mL at 04/23/15 0839 .  tamsulosin (FLOMAX) capsule 0.4 mg, 0.4 mg, Oral, Daily, Gladstone Lighter, MD, 0.4 mg at 04/23/15 0839 .  vancomycin (VANCOCIN) IVPB 1000 mg/200 mL premix, 1,000 mg, Intravenous, Q12H, Henreitta Leber, MD, 1,000 mg at 04/23/15 0124 .  zolpidem (AMBIEN) tablet 5 mg, 5 mg, Oral, QHS PRN, Lytle Butte, MD, 5 mg at 04/21/15 2251  IMAGING    Dg Chest Port 1 View  04/22/2015  CLINICAL DATA:  Respiratory difficulty, COPD, tracheostomy EXAM: PORTABLE CHEST 1 VIEW COMPARISON:  04/21/2015 FINDINGS: Limited portable exam, rotated to the right. Tracheostomy is 3 cm above the carina. Upper lobes remain clear. Bilateral lower lobe infrahilar masses better appreciated by CT comparison. Small pleural effusions noted. No significant interval change or pneumothorax. Postop changes in the right hilum. IMPRESSION: Stable tracheostomy and upper lobe aeration Large bilateral infrahilar lung masses Small pleural effusions Little interval change. Electronically Signed   By: Jerilynn Mages.  Shick M.D.   On: 04/22/2015 14:19      Indwelling Urinary Catheter continued, requirement due to   Reason to continue Indwelling Urinary Catheter for strict Intake/Output monitoring for hemodynamic instability   Central Line continued, requirement due to   Reason to continue [employer] Monitoring of central  venous pressure or other hemodynamic parameters   Ventilator continued, requirement due to, resp failure    Ventilator Sedation RASS 0 to -2   Lines:  Cultures: Sputum 10/26>> BAL 10/26>>  Antibiotics: Vanc 10/25>> Zosyn 10/25>>   ASSESSMENT/PLAN  81 y.o. male with a known history of chronic respiratory failure on trach collar, COPD, history of laryngeal cancer, prostate cancer currently on injections presents to the hospital from home secondary to respiratory distress and difficulty breathing.  Acute respiratory failure- secondary to worsening pneumonia-healthcare acquired pneumonia/multilobar pneumonia with possible necrosis. -Continue with antibiotics -Continue with gentle suctioning -Masses seen on chest CT are most likely multilobar pneumonia with rounded atelectasis, plan for trach aspirate culture and will attempt bronchoscopy. -Recent admission for pneumonia, sputum culture at that time mostly with yeast/fungus. -Infectious disease.following, appreciate consult. -Patient with multiple organisms over the past 6 months for pneumonia, culture directed antibiotics adjusted for this patient.  COPD-with acute exacerbation, wheezing on exam. -Duo-nebs, Solu-Medrol IV at this time- may transition to by mouth steroids and wean over a two-week period   prostate cancer-following with urologist. Has adenocarcinoma of the  prostate -Received six-month Lupron injection in August 2016 -Continue outpatient follow-up  Barrett's esophagus-continue outpatient monitoring. -On Protonix twice a day  I have personally obtained a history, examined the patient, evaluated laboratory and imaging results, formulated the assessment and plan and placed orders.  Pulmonary consult time-35 minutes  Overall, patient is critically ill, prognosis is guarded. Patient at high risk for cardiac arrest and death.   Vilinda Boehringer, MD [facility] Pager [phone] (please enter  7-digits) On Call Pager - [phone] (please enter 7-[phone])     04/23/2015, 12:03 PM

## 2015-04-23 NOTE — Progress Notes (Signed)
[facility] [place] NAME: Stephen Taylor    MR#:  [id number]  DATE OF BIRTH:  [birth year]  SUBJECTIVE:  CHIEF COMPLAINT:   Chief Complaint  Patient presents with  . Shortness of Breath   Patient here due to shortness of breath, cough, productive sputum. 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 S/p Bronchoscopy & clean out yesterday and feels much better.  Much more awake and shortness of breath improved.  Wife at bedside.   REVIEW OF SYSTEMS:    Review of Systems  Constitutional: Negative for fever and chills.  HENT: Negative for congestion and tinnitus.   Eyes: Negative for blurred vision and double vision.  Respiratory: Positive for cough, sputum production (improved) and shortness of breath. Negative for wheezing.   Cardiovascular: Negative for chest pain, orthopnea and PND.  Gastrointestinal: Negative for nausea, vomiting, abdominal pain and diarrhea.  Genitourinary: Negative for dysuria and hematuria.  Neurological: Positive for weakness (generalized). Negative for dizziness, sensory change and focal weakness.  All other systems reviewed and are negative.   Nutrition: Tube feedings Tolerating Diet: Yes Tolerating PT: Await evaluation    DRUG ALLERGIES:   Allergies  Allergen Reactions  . Morphine And Related Other (See Comments)    Reaction:  Altered mental status    VITALS:  Blood pressure 123/53, pulse 72, temperature 96 F (35.6 C), temperature source Rectal, resp. rate 20, height 5\' 11"  (1.803 m), weight 78.835 kg (173 lb 12.8 oz), SpO2 98 %.  PHYSICAL EXAMINATION:   Physical Exam  GENERAL:  81 y.o.-year-old patient sitting up in chair in NAD.   EYES: Pupils equal, round, reactive to light and accommodation. No scleral icterus. Extraocular muscles intact.  HEENT: Head atraumatic, normocephalic. Oropharynx and nasopharynx clear.  + Tracheostomy with Trach Collar.   NECK:   Supple, no jugular venous distention. No thyroid enlargement, no tenderness.  LUNGS: Good resp. effort, no wheezing, rales, rhonchi. No use of accessory muscles of respiration.  CARDIOVASCULAR: S1, S2 normal. No murmurs, rubs, or gallops.  ABDOMEN: Soft, nontender, nondistended. Bowel sounds present. No organomegaly or mass.  EXTREMITIES: No cyanosis, clubbing or edema b/l.    NEUROLOGIC: Cranial nerves II through XII are intact. No focal Motor or sensory deficits b/l.  Globally weak PSYCHIATRIC: The patient is alert and oriented x 3. Good affect.  SKIN: No obvious rash, lesion, or ulcer.    LABORATORY PANEL:   CBC  Recent Labs Lab 04/23/15 0516  WBC 32.7*  HGB 8.8*  HCT 30.2*  PLT 355   ------------------------------------------------------------------------------------------------------------------  Chemistries   Recent Labs Lab 04/23/15 0516  NA 140  K 4.9  CL 96*  CO2 35*  GLUCOSE 371*  BUN 30*  CREATININE 0.91  CALCIUM 9.5   ------------------------------------------------------------------------------------------------------------------  Cardiac Enzymes  Recent Labs Lab 04/21/15 1742  TROPONINI <0.03   ------------------------------------------------------------------------------------------------------------------  RADIOLOGY:  Dg Chest 1 View  04/21/2015  CLINICAL DATA:  increased SOB and thick secretions in trach that wife was unable to suction. Pt states what has come out was thick and yellow. Pt is normally on room air EXAM: CHEST  1 VIEW COMPARISON:  04/06/2015 FINDINGS: Tracheostomy remains in place. Low lung volumes with crowding of bronchovascular structures particularly around the hila. Masslike opacity in the left lower lung as before. Right infrahilar airspace opacity somewhat less well demarcated than on previous exam. Airspace opacities extend laterally at the right lung base. Vascular clips overlie the right hilum.  Heart size upper limits normal  for technique. Atheromatous aorta. No pneumothorax. Blunting of the left lateral costophrenic angle suggesting small effusion. Visualized skeletal structures are unremarkable. IMPRESSION: 1. Progressive right lower lung airspace disease possibly pneumonia. 2. Persistent left lower lobe lung masslike opacity. CT may be useful to differentiate pneumonia from neoplasm. Electronically Signed   By: Lucrezia Europe M.D.   On: 04/21/2015 18:08   Ct Chest W Contrast  04/21/2015  CLINICAL DATA:  81 year old male with increasing shortness of breath and thick tracheal secretions. EXAM: CT CHEST WITH CONTRAST TECHNIQUE: Multidetector CT imaging of the chest was performed during intravenous contrast administration. CONTRAST:  32mL OMNIPAQUE IOHEXOL 300 MG/ML  SOLN COMPARISON:  Chest CT 02/22/2013. FINDINGS: Mediastinum/Lymph Nodes: Tracheostomy tube in position with tip approximately 2.2 cm above the carina. Heart size is borderline enlarged. There is no significant pericardial fluid, thickening or pericardial calcification. There is atherosclerosis of the thoracic aorta, the great vessels of the mediastinum and the coronary arteries, including calcified atherosclerotic plaque in the left main, left anterior descending, left circumflex and right coronary arteries. Severe calcifications of the aortic valve. Mild calcifications of the mitral annulus. Right hilar lymphadenopathy measuring up to 1.4 cm in short axis. Left hilar lymphadenopathy measuring up to 12 mm in short axis. Multiple borderline enlarged mediastinal lymph nodes measuring up to 9 mm in short axis in the low right paratracheal station. Postoperative changes from apparent partial esophagectomy and gastric pull-through. No axillary lymphadenopathy. Lungs/Pleura: Status post right upper lobectomy. Large bilateral lower lobe masses. The right lower lobe mass measures up to 11.1 x 7.8 cm (image 39 of series 2) and appears to have extensive internal low-attenuation,  which may suggest internal areas of necrosis. The left lower lobe mass measures up to 6.5 x 7.8 cm (image 38 of series 2). Both of these lesions are associated with some distal postobstructive changes, likely postobstructive pneumonitis. A few other scattered tiny pulmonary nodules are noted throughout the lungs bilaterally, nonspecific. Small right pleural effusion lying dependently. Upper Abdomen: Partially calcified gallstones lying dependently in the gallbladder. Incompletely visualize 9 mm calculus in the lower pole collecting system of the right kidney. Numerous surgical clips throughout the upper abdomen. Percutaneous gastrostomy tube with tip in the distal body of the stomach. Extensive atherosclerosis. Musculoskeletal/Soft Tissues: Multiple old healed right-sided rib fractures with posttraumatic deformity in the right chest wall. There are no aggressive appearing lytic or blastic lesions noted in the visualized portions of the skeleton. IMPRESSION: 1. Large bilateral lower lobe mass is concerning for either metastatic disease or synchronous primary bronchogenic neoplasms. These have increased significantly in size compared to prior CT of the abdomen and pelvis 06/14/2014. New small right pleural effusion is likely malignant. Mild postobstructive pneumonitis within the lower lobes of the lungs bilaterally. Bilateral hilar lymphadenopathy is also noted. 2. Cholelithiasis without evidence of acute cholecystitis at this time. 3. 9 mm nonobstructive calculus in the lower pole of the right kidney. 4. There are calcifications of the aortic valve (severe) and mitral annulus. Echocardiographic correlation for evaluation of potential valvular dysfunction may be warranted if clinically indicated. Electronically Signed   By: Vinnie Langton M.D.   On: 04/21/2015 21:18   Dg Chest Port 1 View  04/22/2015  CLINICAL DATA:  Respiratory difficulty, COPD, tracheostomy EXAM: PORTABLE CHEST 1 VIEW COMPARISON:  04/21/2015  FINDINGS: Limited portable exam, rotated to the right. Tracheostomy is 3 cm above the carina. Upper lobes remain clear. Bilateral lower lobe infrahilar masses better appreciated  by CT comparison. Small pleural effusions noted. No significant interval change or pneumothorax. Postop changes in the right hilum. IMPRESSION: Stable tracheostomy and upper lobe aeration Large bilateral infrahilar lung masses Small pleural effusions Little interval change. Electronically Signed   By: Jerilynn Mages.  Shick M.D.   On: 04/22/2015 14:19     ASSESSMENT AND PLAN:   81 year old male with past medical history of laryngeal cancer status post tracheostomy, history of prostate cancer, COPD, history of previous CVA, depression, BPH, osteoarthritis, chronic anemia, history of coronary artery disease, who presents to the hospital due to shortness of breath and weakness and noted to be in acute on chronic respiratory failure and also noted to have a pneumonia.  #1 Acute on chronic respiratory failure- secondary to worsening pneumonia-healthcare acquired pneumonia - Patient is status post tracheostomy due to previous history of laryngeal cancer. - s/p Bronch yesterday and clean out and feels much better. BAL from Bronch growing gram (-) rod.  - Continue broad-spectrum IV antibiotics with vancomycin, Zosyn. Follow sputum culture sensitivities. Continue aggressive pulmonary toileting. -Appreciate pulmonary help.   #2 COPD-with acute exacerbation, due to underlying pneumonia. -Continue IV Solu-Medrol but will taper, cont. DuoNeb's, frequent suctioning and aggressive pulmonary toileting. -Continue IV Vanco, Zosyn for HCAP  #3 prostate cancer-following with urologist. Has adenocarcinoma of the prostate -Received six-month Lupron injection in August 2016 -Continue outpatient follow-up.  Continue Flomax  #4 Barrett's esophagus-continue outpatient monitoring. -Continue Protonix  #5 depression-continue Celexa  Will transfer out of  CCU to medical floor/off tele.   All the records are reviewed and case discussed with Care Management/Social Workerr. Management plans discussed with the patient, family and they are in agreement.  CODE STATUS: Full  DVT Prophylaxis: Lovenox  TOTAL TIME TAKING CARE OF THIS PATIENT: 30 minutes.   POSSIBLE D/C IN 2-3 DAYS, DEPENDING ON CLINICAL CONDITION.   Henreitta Leber M.D on 04/23/2015 at 12:11 PM  Between 7am to 6pm - Pager - [phone]  After 6pm go to [email or web address] - password [email or web address] [facility]  Office  [phone]  CC: Primary care physician; Lavera Guise, MD

## 2015-04-23 NOTE — Progress Notes (Signed)
Nutrition Follow-up  DOCUMENTATION CODES:   Severe malnutrition in context of chronic illness  INTERVENTION:  EN: Recommend continuing jevity 1.5 for 16 hr per day at goal rate of 42ml/hr to meet nutritional needs.     NUTRITION DIAGNOSIS:   Unintentional weight loss related to chronic illness as evidenced by percent weight loss.    GOAL:   Patient will meet greater than or equal to 90% of their needs    MONITOR:    (EN, Digestive System, Electrolyte/Renal Profile, Glucose Profile, Anthropometrics)  REASON FOR ASSESSMENT:   Consult Enteral/tube feeding initiation and management  ASSESSMENT:      Pt with obstructing pneumonia, questionable necrotic mass   Current Nutrition: tolerating jevity 1.5 for 16 hrs.     Gastrointestinal Profile: no other signs or symptoms of GI intolerance Last BM: BM this am per RN, [name]   Scheduled Medications:  . acetylcysteine  4 mL Nebulization BID  . antiseptic oral rinse  7 mL Mouth Rinse q12n4p  . aspirin EC  81 mg Oral Daily  . chlorhexidine  15 mL Mouth Rinse BID  . Chlorhexidine Gluconate Cloth  6 each Topical Q0600  . citalopram  20 mg Per Tube Daily  . enoxaparin (LOVENOX) injection  40 mg Subcutaneous Q24H  . ferrous sulfate  325 mg Oral Daily  . fluconazole (DIFLUCAN) IV  400 mg Intravenous Q24H  . folic acid  1 mg Per Tube Daily  . free water  250 mL Per Tube 3 times per day  . insulin aspart  0-9 Units Subcutaneous TID WC  . ipratropium-albuterol  3 mL Nebulization Q4H  . loratadine  10 mg Per Tube Daily  . methylPREDNISolone (SOLU-MEDROL) injection  40 mg Intravenous 3 times per day  . montelukast  10 mg Per Tube QHS  . multivitamin with minerals  1 tablet Oral Daily  . mupirocin ointment  1 application Nasal BID  . pantoprazole sodium  40 mg Per Tube BID  . piperacillin-tazobactam (ZOSYN)  IV  3.375 g Intravenous 3 times per day  . sodium chloride  3 mL Intravenous Q12H  . tamsulosin  0.4 mg Oral Daily  .  vancomycin  1,000 mg Intravenous Q12H   aspart insulin added for blood glucose control  Continuous Medications:  . feeding supplement (JEVITY 1.5 CAL/FIBER) 1,000 mL (04/22/15 1620)     Electrolyte/Renal Profile and Glucose Profile:   Recent Labs Lab 04/21/15 1742 04/22/15 0556 04/23/15 0516  NA 133* 136 140  K 4.7 4.5 4.9  CL 93* 94* 96*  CO2 31 36* 35*  BUN 24* 20 30*  CREATININE 0.68 0.69 0.91  CALCIUM 8.6* 8.9 9.5  GLUCOSE 183* 225* 371*      Weight Trend since Admission: Filed Weights   04/21/15 1734  Weight: 173 lb 12.8 oz (78.835 kg)      Diet Order:  Diet NPO time specified  Skin:   reviewed    Height:   Ht Readings from Last 1 Encounters:  04/21/15 5\' 11"  (1.803 m)    Weight:   Wt Readings from Last 1 Encounters:  04/21/15 173 lb 12.8 oz (78.835 kg)        BMI:  Body mass index is 24.25 kg/(m^2).  Estimated Nutritional Needs:   Kcal:  2177-2540 kcals (BEE 1512, 1.2 AF, 1.2-1.4 IF)   Protein:  87-103 g (1.1-1.3 g/kg)   Fluid:  1975-2370 mL (25-30 ml/kg)   EDUCATION NEEDS:   No education needs identified at this time  MODERATE Care Level  Stephen Taylor, [place], [place] (pager)

## 2015-04-23 NOTE — Progress Notes (Addendum)
ANTIBIOTIC CONSULT NOTE - INITIAL  Pharmacy Consult for meropenem Indication: pneumonia  Allergies  Allergen Reactions  . Morphine And Related Other (See Comments)    Reaction:  Altered mental status    Patient Measurements: Height: 5\' 11"  (180.3 cm) Weight: 173 lb 12.8 oz (78.835 kg) IBW/kg (Calculated) : 75.3  Vital Signs: Temp: 97.7 F (36.5 C) (10/27 1235) Temp Source: Oral (10/27 1317) BP: 167/75 mmHg (10/27 1317) Pulse Rate: 79 (10/27 1235)  Labs:  Recent Labs  04/21/15 1742 04/22/15 0556 04/23/15 0516  WBC 13.9* 12.0* 32.7*  HGB 8.2* 8.7* 8.8*  PLT 240 232 355  CREATININE 0.68 0.69 0.91   Estimated Creatinine Clearance: 67.8 mL/min (by C-G formula based on Cr of 0.91). No results for input(s): VANCOTROUGH, VANCOPEAK, VANCORANDOM, GENTTROUGH, GENTPEAK, GENTRANDOM, TOBRATROUGH, TOBRAPEAK, TOBRARND, AMIKACINPEAK, AMIKACINTROU, AMIKACIN in the last 72 hours.   Microbiology: Recent Results (from the past 720 hour(s))  Culture, blood (routine x 2)     Status: None   Collection Time: 04/06/15  4:11 PM  Result Value Ref Range Status   Specimen Description BLOOD RIGHT ANTECUBITAL  Final   Special Requests BOTTLES DRAWN AEROBIC AND ANAEROBIC 10ML  Final   Culture NO GROWTH 5 DAYS  Final   Report Status 04/11/2015 FINAL  Final  Culture, blood (routine x 2)     Status: None   Collection Time: 04/06/15  4:11 PM  Result Value Ref Range Status   Specimen Description BLOOD LEFT  Final   Special Requests BOTTLES DRAWN AEROBIC AND ANAEROBIC 20ML  Final   Culture NO GROWTH 5 DAYS  Final   Report Status 04/11/2015 FINAL  Final  Culture, expectorated sputum-assessment     Status: None   Collection Time: 04/07/15  6:25 AM  Result Value Ref Range Status   Specimen Description SPUTUM  Final   Special Requests Immunocompromised  Final   Sputum evaluation THIS SPECIMEN IS ACCEPTABLE FOR SPUTUM CULTURE  Final   Report Status 04/07/2015 FINAL  Final  Culture, respiratory  (NON-Expectorated)     Status: None   Collection Time: 04/07/15  6:25 AM  Result Value Ref Range Status   Specimen Description SPUTUM  Final   Special Requests Immunocompromised Reflexed from [id number]  Final   Gram Stain   Final    MODERATE WBC SEEN MANY YEAST MANY GRAM POSITIVE RODS GOOD SPECIMEN - 80-90% WBCS    Culture   Final    HEAVY GROWTH CANDIDA TROPICALIS MODERATE GROWTH CANDIDA ALBICANS RARE ACINETOBACTER LWOFFII    Report Status 04/12/2015 FINAL  Final   Organism ID, Bacteria ACINETOBACTER LWOFFII  Final      Susceptibility   Acinetobacter lwoffii - MIC*    CEFTRIAXONE Value in next row Intermediate      INTERMEDIATE16    CIPROFLOXACIN Value in next row Resistant      RESISTANT>=4    GENTAMICIN Value in next row Sensitive      SENSITIVE<=1    IMIPENEM Value in next row Sensitive      SENSITIVE<=0.25    PIP/TAZO Value in next row Resistant      RESISTANT>=128    TRIMETH/SULFA Value in next row Sensitive      SENSITIVE<=20    CEFEPIME Value in next row Sensitive      SENSITIVE2    AMPICILLIN/SULBACTAM Value in next row Sensitive      SENSITIVE<=2    * RARE ACINETOBACTER LWOFFII  Culture, expectorated sputum-assessment     Status: None   Collection  Time: 04/22/15 12:40 AM  Result Value Ref Range Status   Specimen Description SPUTUM  Final   Special Requests Normal  Final   Sputum evaluation THIS SPECIMEN IS ACCEPTABLE FOR SPUTUM CULTURE  Final   Report Status 04/23/2015 FINAL  Final  Culture, respiratory (NON-Expectorated)     Status: None (Preliminary result)   Collection Time: 04/22/15 12:40 AM  Result Value Ref Range Status   Specimen Description SPUTUM  Final   Special Requests Normal Reflexed from [id number]  Final   Gram Stain PENDING  Incomplete   Culture   Final    HEAVY GROWTH GRAM NEGATIVE RODS IDENTIFICATION AND SUSCEPTIBILITIES TO FOLLOW ONCE ISOLATED    Report Status PENDING  Incomplete  Fungus Culture with Smear     Status: None (Preliminary  result)   Collection Time: 04/22/15  3:14 PM  Result Value Ref Range Status   Specimen Description BRONCHIAL ALVEOLAR LAVAGE  Final   Special Requests Normal  Final   Fungal Smear PENDING  Incomplete   Culture NO FUNGUS ISOLATED <24 HOURS  Final   Report Status PENDING  Incomplete  Culture, bal-quantitative     Status: None (Preliminary result)   Collection Time: 04/22/15  3:14 PM  Result Value Ref Range Status   Specimen Description BRONCHIAL ALVEOLAR LAVAGE  Final   Special Requests Normal  Final   Gram Stain PENDING  Incomplete   Culture   Final    HEAVY GROWTH GRAM NEGATIVE RODS IDENTIFICATION AND SUSCEPTIBILITIES TO FOLLOW    Report Status PENDING  Incomplete  MRSA PCR Screening     Status: None   Collection Time: 04/22/15  5:23 PM  Result Value Ref Range Status   MRSA by PCR NEGATIVE NEGATIVE Final    Comment:        The GeneXpert MRSA Assay (FDA approved for NASAL specimens only), is one component of a comprehensive MRSA colonization surveillance program. It is not intended to diagnose MRSA infection nor to guide or monitor treatment for MRSA infections.    Assessment: Pharmacy consulted to dose meropenem for PNA in this 81 year old male with HCAP/multilobar pneumonia with possible necrosis.  Previous sputum cultures with candida and acinetobacter resistant to zosyn, sensitive to imipenem. Patient currently on vancomycin, metronidazole, and fluconazole.   Plan:  Meropenem 1gm IV Q8H ordered. Continue to follow cultures and renal function.  Pharmacy to follow per consult.  Rexene Edison, PharmD Clinical Pharmacist  04/23/2015 3:36 PM

## 2015-04-23 NOTE — Progress Notes (Signed)
ANTIBIOTIC CONSULT NOTE - INITIAL  Pharmacy Consult for vancomycin and piperacillin/tazobactam Indication: pneumonia  Allergies  Allergen Reactions  . Morphine And Related Other (See Comments)    Reaction:  Altered mental status    Patient Measurements: Height: 5\' 11"  (180.3 cm) Weight: 173 lb 12.8 oz (78.835 kg) IBW/kg (Calculated) : 75.3  Vital Signs: Temp: 97.7 F (36.5 C) (10/27 1235) Temp Source: Oral (10/27 1317) BP: 167/75 mmHg (10/27 1317) Pulse Rate: 79 (10/27 1235) Intake/Output from previous day: 10/26 0701 - 10/27 0700 In: 2150 [NG/GT:1250; IV Piggyback:800] Out: 500 [Urine:500] Intake/Output from this shift: Total I/O In: -  Out: 200 [Urine:200]  Labs:  Recent Labs  04/21/15 1742 04/22/15 0556 04/23/15 0516  WBC 13.9* 12.0* 32.7*  HGB 8.2* 8.7* 8.8*  PLT 240 232 355  CREATININE 0.68 0.69 0.91   Estimated Creatinine Clearance: 67.8 mL/min (by C-G formula based on Cr of 0.91).  Recent Labs  04/23/15 1355  [place] 14     Microbiology: Recent Results (from the past 720 hour(s))  Culture, blood (routine x 2)     Status: None   Collection Time: 04/06/15  4:11 PM  Result Value Ref Range Status   Specimen Description BLOOD RIGHT ANTECUBITAL  Final   Special Requests BOTTLES DRAWN AEROBIC AND ANAEROBIC 10ML  Final   Culture NO GROWTH 5 DAYS  Final   Report Status 04/11/2015 FINAL  Final  Culture, blood (routine x 2)     Status: None   Collection Time: 04/06/15  4:11 PM  Result Value Ref Range Status   Specimen Description BLOOD LEFT  Final   Special Requests BOTTLES DRAWN AEROBIC AND ANAEROBIC 20ML  Final   Culture NO GROWTH 5 DAYS  Final   Report Status 04/11/2015 FINAL  Final  Culture, expectorated sputum-assessment     Status: None   Collection Time: 04/07/15  6:25 AM  Result Value Ref Range Status   Specimen Description SPUTUM  Final   Special Requests Immunocompromised  Final   Sputum evaluation THIS SPECIMEN IS ACCEPTABLE FOR  SPUTUM CULTURE  Final   Report Status 04/07/2015 FINAL  Final  Culture, respiratory (NON-Expectorated)     Status: None   Collection Time: 04/07/15  6:25 AM  Result Value Ref Range Status   Specimen Description SPUTUM  Final   Special Requests Immunocompromised Reflexed from [id number]  Final   Gram Stain   Final    MODERATE WBC SEEN MANY YEAST MANY GRAM POSITIVE RODS GOOD SPECIMEN - 80-90% WBCS    Culture   Final    HEAVY GROWTH CANDIDA TROPICALIS MODERATE GROWTH CANDIDA ALBICANS RARE ACINETOBACTER LWOFFII    Report Status 04/12/2015 FINAL  Final   Organism ID, Bacteria ACINETOBACTER LWOFFII  Final      Susceptibility   Acinetobacter lwoffii - MIC*    CEFTRIAXONE Value in next row Intermediate      INTERMEDIATE16    CIPROFLOXACIN Value in next row Resistant      RESISTANT>=4    GENTAMICIN Value in next row Sensitive      SENSITIVE<=1    IMIPENEM Value in next row Sensitive      SENSITIVE<=0.25    PIP/TAZO Value in next row Resistant      RESISTANT>=128    TRIMETH/SULFA Value in next row Sensitive      SENSITIVE<=20    CEFEPIME Value in next row Sensitive      SENSITIVE2    AMPICILLIN/SULBACTAM Value in next row Sensitive  SENSITIVE<=2    * RARE ACINETOBACTER LWOFFII  Culture, expectorated sputum-assessment     Status: None   Collection Time: 04/22/15 12:40 AM  Result Value Ref Range Status   Specimen Description SPUTUM  Final   Special Requests Normal  Final   Sputum evaluation THIS SPECIMEN IS ACCEPTABLE FOR SPUTUM CULTURE  Final   Report Status 04/23/2015 FINAL  Final  Culture, respiratory (NON-Expectorated)     Status: None (Preliminary result)   Collection Time: 04/22/15 12:40 AM  Result Value Ref Range Status   Specimen Description SPUTUM  Final   Special Requests Normal Reflexed from [id number]  Final   Gram Stain PENDING  Incomplete   Culture   Final    HEAVY GROWTH GRAM NEGATIVE RODS IDENTIFICATION AND SUSCEPTIBILITIES TO FOLLOW ONCE ISOLATED    Report  Status PENDING  Incomplete  Fungus Culture with Smear     Status: None (Preliminary result)   Collection Time: 04/22/15  3:14 PM  Result Value Ref Range Status   Specimen Description BRONCHIAL ALVEOLAR LAVAGE  Final   Special Requests Normal  Final   Fungal Smear PENDING  Incomplete   Culture NO FUNGUS ISOLATED <24 HOURS  Final   Report Status PENDING  Incomplete  Culture, bal-quantitative     Status: None (Preliminary result)   Collection Time: 04/22/15  3:14 PM  Result Value Ref Range Status   Specimen Description BRONCHIAL ALVEOLAR LAVAGE  Final   Special Requests Normal  Final   Gram Stain PENDING  Incomplete   Culture   Final    HEAVY GROWTH GRAM NEGATIVE RODS IDENTIFICATION AND SUSCEPTIBILITIES TO FOLLOW    Report Status PENDING  Incomplete  MRSA PCR Screening     Status: None   Collection Time: 04/22/15  5:23 PM  Result Value Ref Range Status   MRSA by PCR NEGATIVE NEGATIVE Final    Comment:        The GeneXpert MRSA Assay (FDA approved for NASAL specimens only), is one component of a comprehensive MRSA colonization surveillance program. It is not intended to diagnose MRSA infection nor to guide or monitor treatment for MRSA infections.     Medical History: Past Medical History  Diagnosis Date  . Cancer (HCC)     laryngeal cancer, prostrate cancer  . COPD (chronic obstructive pulmonary disease) ([place])   . Coronary artery disease   . Stroke [facility])     no residual weakness  . Depression   . Sleep apnea   . MRSA pneumonia ([place])   . Barrett's esophagus   . BPH (benign prostatic hyperplasia)   . Nephrolithiasis   . Osteoarthritis   . Anemia     Medications:  Anti-infectives    Start     Dose/Rate Route Frequency Ordered Stop   04/24/15 0330  vancomycin (VANCOCIN) 1,250 mg in sodium chloride 0.9 % 250 mL IVPB     1,250 mg 166.7 mL/hr over 90 Minutes Intravenous Every 12 hours 04/23/15 1644     04/23/15 1530  metroNIDAZOLE (FLAGYL) IVPB 500 mg     500  mg 100 mL/hr over 60 Minutes Intravenous Every 8 hours 04/23/15 1522     04/23/15 1530  meropenem (MERREM) 1 g in sodium chloride 0.9 % 100 mL IVPB     1 g 200 mL/hr over 30 Minutes Intravenous 3 times per day 04/23/15 1526     04/22/15 1800  fluconazole (DIFLUCAN) IVPB 400 mg     400 mg 100 mL/hr over 120 Minutes Intravenous  Every 24 hours 04/22/15 1639     04/22/15 1730  fluconazole (DIFLUCAN) IVPB 400 mg  Status:  Discontinued     400 mg 100 mL/hr over 120 Minutes Intravenous Every 24 hours 04/22/15 1638 04/22/15 1639   04/22/15 0600  piperacillin-tazobactam (ZOSYN) IVPB 3.375 g  Status:  Discontinued     3.375 g 12.5 mL/hr over 240 Minutes Intravenous 3 times per day 04/21/15 1904 04/21/15 2144   04/22/15 0400  piperacillin-tazobactam (ZOSYN) IVPB 3.375 g  Status:  Discontinued     3.375 g 12.5 mL/hr over 240 Minutes Intravenous 3 times per day 04/21/15 2206 04/23/15 1522   04/22/15 0100  vancomycin (VANCOCIN) IVPB 1000 mg/200 mL premix  Status:  Discontinued     1,000 mg 200 mL/hr over 60 Minutes Intravenous Every 12 hours 04/21/15 1856 04/21/15 2144   04/22/15 0100  vancomycin (VANCOCIN) IVPB 1000 mg/200 mL premix  Status:  Discontinued     1,000 mg 200 mL/hr over 60 Minutes Intravenous Every 12 hours 04/21/15 2205 04/23/15 1643   04/21/15 2200  piperacillin-tazobactam (ZOSYN) IVPB 3.375 g  Status:  Discontinued     3.375 g 100 mL/hr over 30 Minutes Intravenous 3 times per day 04/21/15 1848 04/21/15 1903   04/21/15 2000  piperacillin-tazobactam (ZOSYN) IVPB 3.375 g     3.375 g 100 mL/hr over 30 Minutes Intravenous  Once 04/21/15 1903 04/21/15 2007   04/21/15 1900  vancomycin (VANCOCIN) IVPB 1000 mg/200 mL premix     1,000 mg 200 mL/hr over 60 Minutes Intravenous  Once 04/21/15 1848 04/21/15 2043     Assessment: Pharmacy consulted to dose piperacillin/tazobactam and vancomycin in this 82 year old male presenting with pneumonia.   Goal of Therapy:  Vancomycin trough level  15-20 mcg/ml  Plan:  Expected duration 7 days with resolution of temperature and/or normalization of WBC Measure antibiotic drug levels at steady state Follow up culture results   Will continue Vancomycin maintenance dose of 1g q12h. Vancomycin trough 10/27 @ 12:30  Piperacillin/tazobactam 3.375 g IV q8h EI  Pharmacy will continue to follow renal function and vancomycin levels and make dose adjustments as needed. Thank you for the consult.  10/27:   Vanc trough @ 13:55 = 14 mcg/mL  Will increase dose to Vancomycin 1250 mg IV Q12H to start 10/28 @ 3:30. Will draw next Vanc trough before 3rd new dose on 10/29 @ 3:00.   Aleese Kamps D 04/23/2015,4:45 PM

## 2015-04-23 NOTE — Progress Notes (Signed)
RT into room to give neb tx.  Patient requested to be suctioned.  Patient suctioned with 48fr catheter twice with small amount tan thick secretions.  Patient stated he felt better, neb given.

## 2015-04-23 NOTE — Progress Notes (Signed)
Patient's temperature is 96.0 rectally. Patient is refusing bear hugger and warm blankets. MD aware, no new orders received.

## 2015-04-23 NOTE — Progress Notes (Signed)
Inpatient Diabetes Program Recommendations  AACE/ADA: New Consensus Statement on Inpatient Glycemic Control (2015)  Target Ranges:  Prepandial:   less than 140 mg/dL      Peak postprandial:   less than 180 mg/dL (1-2 hours)      Critically ill patients:  140 - 180 mg/dL  Results for AKIA, DESROCHES (MRN [id number]) as of 04/23/2015 10:35  Ref. Range 04/21/2015 17:42 04/22/2015 05:56 04/23/2015 05:16  Glucose Latest Ref Range: 65-99 mg/dL 183 (H) 225 (H) 371 (H)   Review of Glycemic Control  Diabetes history: NO Outpatient Diabetes medications: NA Current orders for Inpatient glycemic control: None  Inpatient Diabetes Program Recommendations: Correction (SSI): While inpatient and ordered steroids, please consider ordering CBGs with Novolog correction scale Q4H.  NOTE: Patient does not have a documented history of diabetes. Initial lab glucose was 183 mg/dl on 04/21/15 and fasting glucose 371 mg/dl this morning. Patient is ordered Solumedrol 40 mg TID which is likely cause of hyperglycemia.   Thanks, Barnie Alderman, RN, MSN, CCRN, CDE Diabetes Coordinator Inpatient Diabetes Program [phone] (Team Pager from [place] to [place]) [phone] ([facility] office) [phone] [facility] office) [phone] [facility] office)

## 2015-04-23 NOTE — Progress Notes (Signed)
Pt became very SOB and in distress, 02 sat dropped to 70%, Rapid response called. Respiratory called and reconnected oxygen tubing to trach and increased oxygen to 50% instead of 30. Lungs diminished. VSS 02 sat 90% now

## 2015-04-23 NOTE — Progress Notes (Signed)
Patient transferring to med-surg today. His assessment is unchanged from this morning and both patient and family are aware and agreeable to transfer. Receiveing RN has been given report all belongings transferring with patient.

## 2015-04-24 ENCOUNTER — Inpatient Hospital Stay: Payer: Medicare Other

## 2015-04-24 LAB — BASIC METABOLIC PANEL
ANION GAP: 7 (ref 5–15)
BUN: 34 mg/dL — AB (ref 6–20)
CHLORIDE: 99 mmol/L — AB (ref 101–111)
CO2: 36 mmol/L — ABNORMAL HIGH (ref 22–32)
Calcium: 9.4 mg/dL (ref 8.9–10.3)
Creatinine, Ser: 0.65 mg/dL (ref 0.61–1.24)
GFR calc Af Amer: >60 mL/min (ref >60)
GLUCOSE: 221 mg/dL — AB (ref 65–99)
POTASSIUM: 5 mmol/L (ref 3.5–5.1)
Sodium: 142 mmol/L (ref 135–145)

## 2015-04-24 LAB — CBC
HCT: 27.9 % — ABNORMAL LOW (ref 40.0–52.0)
HEMOGLOBIN: 8.1 g/dL — AB (ref 13.0–18.0)
MCH: 20.4 pg — AB (ref 26.0–34.0)
MCHC: 29.2 g/dL — ABNORMAL LOW (ref 32.0–36.0)
MCV: 69.8 fL — AB (ref 80.0–100.0)
PLATELETS: 295 10*3/uL (ref 150–440)
RBC: 3.99 MIL/uL — AB (ref 4.40–5.90)
RDW: 19.4 % — ABNORMAL HIGH (ref 11.5–14.5)
WBC: 26.8 10*3/uL — AB (ref 3.8–10.6)

## 2015-04-24 LAB — GLUCOSE, CAPILLARY
GLUCOSE-CAPILLARY: 214 mg/dL — AB (ref 65–99)
GLUCOSE-CAPILLARY: 113 mg/dL — AB (ref 65–99)
Glucose-Capillary: 239 mg/dL — ABNORMAL HIGH (ref 65–99)
Glucose-Capillary: 218 mg/dL — ABNORMAL HIGH (ref 65–99)

## 2015-04-24 MED ORDER — IPRATROPIUM-ALBUTEROL 0.5-2.5 (3) MG/3ML IN SOLN
3.0000 mL | Freq: Four times a day (QID) | RESPIRATORY_TRACT | Status: DC
  Administered 2015-04-25 – 2015-04-27 (×9): 3 mL via RESPIRATORY_TRACT
  Filled 2015-04-24 (×10): qty 3

## 2015-04-24 MED ORDER — DEXTROSE 5 % IV SOLN
2.0000 g | Freq: Three times a day (TID) | INTRAVENOUS | Status: DC
  Administered 2015-04-24 – 2015-04-27 (×9): 2 g via INTRAVENOUS
  Filled 2015-04-24 (×12): qty 2

## 2015-04-24 MED ORDER — ITRACONAZOLE 100 MG PO CAPS
100.0000 mg | ORAL_CAPSULE | Freq: Every day | ORAL | Status: DC
  Administered 2015-04-24 – 2015-04-27 (×4): 100 mg via ORAL
  Filled 2015-04-24 (×5): qty 1

## 2015-04-24 MED ORDER — METHYLPREDNISOLONE SODIUM SUCC 40 MG IJ SOLR
40.0000 mg | Freq: Every day | INTRAMUSCULAR | Status: DC
  Administered 2015-04-25 – 2015-04-26 (×2): 40 mg via INTRAVENOUS
  Filled 2015-04-24 (×2): qty 1

## 2015-04-24 NOTE — Care Management Important Message (Signed)
Important Message  Patient Details  Name: Stephen Taylor MRN: [id number] Date of Birth: 17-Jan-1932   Medicare Important Message Given:  Yes-fourth notification given    Shelbie Ammons, RN 04/24/2015, 1:31 PM

## 2015-04-24 NOTE — Care Management (Signed)
Admitted to [facility] with the diagnosis of acute respiratory failure. Discharged from this facility a couple of weeks ago with respiratory issues. Lives with wife, Shauna Hugh [phone]). Sees Dr. Clayborn Bigness as primary care physician. Tracheostomy has been in place for about 15 years. Suction Machine in the home. Mr. Depascale self dresses and baths himself. Uses a cane to aid in ambulation.  Peg tube in place and is maintained by Mr. Ahonen and wife.  Followed by [facility] for skilled nursing, physical therapy, and social worker.  Contact precautions continue for MRSA and ESBL. PICC to be placed today. Receiving Zosyn, Vancomycin, and Flagyl. WBC's = 28.8 Shelbie Ammons RN MSN CCM Care Management [phone]

## 2015-04-24 NOTE — Progress Notes (Signed)
Nutrition Follow-up  DOCUMENTATION CODES:   Severe malnutrition in context of chronic illness  INTERVENTION:  EN: Reviewed tube feeding to run only for 16 hr per night with RN, Malka. Tube feeding to start each night at 34ml/hr at 17:00 and stop at 9:00 next day (16 hr feeding) to meet nutritional needs.  Pt doing this regimen prior to admission   NUTRITION DIAGNOSIS:   Unintentional weight loss related to chronic illness as evidenced by percent weight loss.   GOAL:   Patient will meet greater than or equal to 90% of their needs    MONITOR:    (EN, Digestive System, Electrolyte/Renal Profile, Glucose Profile, Anthropometrics)  REASON FOR ASSESSMENT:   Consult Enteral/tube feeding initiation and management  ASSESSMENT:     Current Nutrition: tolerating jevity 1.5 at 23ml/hr for 16 hr. Discussed with pt   Gastrointestinal Profile: no signs and symptoms of GI distress Last BM: 10/26   Scheduled Medications:  . acetylcysteine  4 mL Nebulization BID  . antiseptic oral rinse  7 mL Mouth Rinse q12n4p  . aspirin EC  81 mg Oral Daily  . chlorhexidine  15 mL Mouth Rinse BID  . Chlorhexidine Gluconate Cloth  6 each Topical Q0600  . citalopram  20 mg Per Tube Daily  . enoxaparin (LOVENOX) injection  40 mg Subcutaneous Q24H  . ferrous sulfate  325 mg Oral Daily  . fluconazole (DIFLUCAN) IV  400 mg Intravenous Q24H  . folic acid  1 mg Per Tube Daily  . free water  250 mL Per Tube 3 times per day  . insulin aspart  0-9 Units Subcutaneous TID WC  . ipratropium-albuterol  3 mL Nebulization Q4H  . loratadine  10 mg Per Tube Daily  . meropenem (MERREM) IV  1 g Intravenous 3 times per day  . [START ON 04/25/2015] methylPREDNISolone (SOLU-MEDROL) injection  40 mg Intravenous Daily  . metronidazole  500 mg Intravenous Q8H  . montelukast  10 mg Per Tube QHS  . multivitamin with minerals  1 tablet Oral Daily  . mupirocin ointment  1 application Nasal BID  . pantoprazole sodium   40 mg Per Tube BID  . sodium chloride  3 mL Intravenous Q12H  . tamsulosin  0.4 mg Oral Daily    Continuous Medications:  . feeding supplement (JEVITY 1.5 CAL/FIBER) Stopped (04/24/15 1246)     Electrolyte/Renal Profile and Glucose Profile:   Recent Labs Lab 04/22/15 0556 04/23/15 0516 04/24/15 0537  NA 136 140 142  K 4.5 4.9 5.0  CL 94* 96* 99*  CO2 36* 35* 36*  BUN 20 30* 34*  CREATININE 0.69 0.91 0.65  CALCIUM 8.9 9.5 9.4  GLUCOSE 225* 371* 221*       Weight Trend since Admission: Filed Weights   04/21/15 1734  Weight: 173 lb 12.8 oz (78.835 kg)      Diet Order:  Diet NPO time specified  Skin:   reviewed   Height:   Ht Readings from Last 1 Encounters:  04/21/15 5\' 11"  (1.803 m)    Weight:   Wt Readings from Last 1 Encounters:  04/21/15 173 lb 12.8 oz (78.835 kg)    Ideal Body Weight:     BMI:  Body mass index is 24.25 kg/(m^2).  Estimated Nutritional Needs:   Kcal:  2177-2540 kcals (BEE 1512, 1.2 AF, 1.2-1.4 IF)   Protein:  87-103 g (1.1-1.3 g/kg)   Fluid:  1975-2370 mL (25-30 ml/kg)   EDUCATION NEEDS:   No education needs  identified at this time  [place]. Zenia Resides, [place], [place] (pager)

## 2015-04-24 NOTE — Progress Notes (Signed)
Pulmonary Critical Care Medicine PROGRESS NOTE  Stephen Taylor:[id number] DOB: 11-01-1931 DOA: 04/21/2015 PCP: Lavera Guise, MD  Assessment and Plan Active Problems:   Acute respiratory failure ([place])   Protein-calorie malnutrition, severe   Hospital acquired PNA  1. Acute Hypoxic Respiratory failure Patient had a bronchoscopy done for mucus plugging. He has been growing Candida Tropicalis in the office along with Acinebacter and now has Pseudomonas heavy growth Would start coverage with itraconazole for the Candida until sensitivities are known WBC shows improvement  2. COPD -continue with present therapy  HPI/Subjective: Patient was admitted with acute respiratory failure. Patient has a history of chronic trach and has had mucus plugging noted. He is now showing some improvement post bronch and has been transferred to the floor.  Objective: Filed Vitals:   04/24/15 0511  BP: 120/43  Pulse: 79  Temp: 97.5 F (36.4 C)  Resp: 20    Intake/Output Summary (Last 24 hours) at 04/24/15 1308 Last data filed at 04/24/15 1246  Gross per 24 hour  Intake   2506 ml  Output    355 ml  Net   2151 ml   Filed Weights   04/21/15 1734  Weight: 78.835 kg (173 lb 12.8 oz)    Exam:   General:  NAD comfortable  Cardiovascular: RRR S1S2 normal  Respiratory: few scattered rhonchi no rales  Abdomen: soft non-tender  Musculoskeletal: sno synovitis  Data Reviewed: Basic Metabolic Panel:  Recent Labs Lab 04/21/15 1742 04/22/15 0556 04/23/15 0516 04/24/15 0537  NA 133* 136 140 142  K 4.7 4.5 4.9 5.0  CL 93* 94* 96* 99*  CO2 31 36* 35* 36*  GLUCOSE 183* 225* 371* 221*  BUN 24* 20 30* 34*  CREATININE 0.68 0.69 0.91 0.65  CALCIUM 8.6* 8.9 9.5 9.4   Liver Function Tests: No results for input(s): AST, ALT, ALKPHOS, BILITOT, PROT, ALBUMIN in the last 168 hours. No results for input(s): LIPASE, AMYLASE in the last 168 hours. No results for input(s): AMMONIA in the last  168 hours. CBC:  Recent Labs Lab 04/21/15 1742 04/22/15 0556 04/23/15 0516 04/24/15 0537  WBC 13.9* 12.0* 32.7* 26.8*  NEUTROABS 11.1*  --   --   --   HGB 8.2* 8.7* 8.8* 8.1*  HCT 27.9* 28.8* 30.2* 27.9*  MCV 69.2* 69.6* 70.1* 69.8*  PLT 240 232 355 295   Cardiac Enzymes:  Recent Labs Lab 04/21/15 1742  TROPONINI <0.03   BNP (last 3 results)  Recent Labs  11/06/14 1231 04/06/15 1558  BNP 128.0* 286.0*    ProBNP (last 3 results) No results for input(s): PROBNP in the last 8760 hours.  CBG:  Recent Labs Lab 04/23/15 1117 04/23/15 1733 04/23/15 2217 04/24/15 0805 04/24/15 1149  GLUCAP 178* 179* 210* 214* 239*    Recent Results (from the past 240 hour(s))  Culture, expectorated sputum-assessment     Status: None   Collection Time: 04/22/15 12:40 AM  Result Value Ref Range Status   Specimen Description SPUTUM  Final   Special Requests Normal  Final   Sputum evaluation THIS SPECIMEN IS ACCEPTABLE FOR SPUTUM CULTURE  Final   Report Status 04/23/2015 FINAL  Final  Culture, respiratory (NON-Expectorated)     Status: None (Preliminary result)   Collection Time: 04/22/15 12:40 AM  Result Value Ref Range Status   Specimen Description SPUTUM  Final   Special Requests Normal Reflexed from [id number]  Final   Gram Stain PENDING  Incomplete   Culture   Final  HEAVY GROWTH PSEUDOMONAS AERUGINOSA HEAVY GROWTH GRAM NEGATIVE RODS IDENTIFICATION AND SUSCEPTIBILITIES TO FOLLOW    Report Status PENDING  Incomplete   Organism ID, Bacteria PSEUDOMONAS AERUGINOSA  Final      Susceptibility   Pseudomonas aeruginosa - MIC*    CEFTAZIDIME 4 SENSITIVE Sensitive     CIPROFLOXACIN <=0.25 SENSITIVE Sensitive     GENTAMICIN 2 SENSITIVE Sensitive     IMIPENEM 1 SENSITIVE Sensitive     PIP/TAZO Value in next row Sensitive      SENSITIVE<=4    * HEAVY GROWTH PSEUDOMONAS AERUGINOSA  Fungus Culture with Smear     Status: None (Preliminary result)   Collection Time: 04/22/15  3:14  PM  Result Value Ref Range Status   Specimen Description BRONCHIAL ALVEOLAR LAVAGE  Final   Special Requests Normal  Final   Fungal Smear PENDING  Incomplete   Culture NO FUNGUS ISOLATED <24 HOURS  Final   Report Status PENDING  Incomplete  Culture, bal-quantitative     Status: None (Preliminary result)   Collection Time: 04/22/15  3:14 PM  Result Value Ref Range Status   Specimen Description BRONCHIAL ALVEOLAR LAVAGE  Final   Special Requests Normal  Final   Gram Stain PENDING  Incomplete   Culture   Final    HEAVY GROWTH PSEUDOMONAS AERUGINOSA HEAVY GROWTH GRAM NEGATIVE RODS IDENTIFICATION AND SUSCEPTIBILITIES TO FOLLOW    Report Status PENDING  Incomplete   Organism ID, Bacteria PSEUDOMONAS AERUGINOSA  Final      Susceptibility   Pseudomonas aeruginosa - MIC*    CEFTAZIDIME Value in next row Sensitive      SENSITIVE4    CIPROFLOXACIN Value in next row Sensitive      SENSITIVE<=0.25    GENTAMICIN Value in next row Sensitive      SENSITIVE<=1    IMIPENEM Value in next row Sensitive      SENSITIVE1    PIP/TAZO Value in next row Sensitive      SENSITIVE8    * HEAVY GROWTH PSEUDOMONAS AERUGINOSA  MRSA PCR Screening     Status: None   Collection Time: 04/22/15  5:23 PM  Result Value Ref Range Status   MRSA by PCR NEGATIVE NEGATIVE Final    Comment:        The GeneXpert MRSA Assay (FDA approved for NASAL specimens only), is one component of a comprehensive MRSA colonization surveillance program. It is not intended to diagnose MRSA infection nor to guide or monitor treatment for MRSA infections.      Studies: Dg Chest Port 1 View  04/24/2015  CLINICAL DATA:  PICC line placement. EXAM: PORTABLE CHEST 1 VIEW COMPARISON:  Same day. FINDINGS: Stable cardiomegaly. Tracheostomy is unchanged in position. Interval placement of right-sided PICC line with distal tip in expected position of the SVC. No pneumothorax or significant pleural effusion is noted. Large left lower lobe  mass is again noted. IMPRESSION: Stable large left lower lobe lung mass. Interval placement of right-sided PICC line with distal tip in expected position of the SVC. Electronically Signed   By: Marijo Conception, M.D.   On: 04/24/2015 12:50   Dg Chest Port 1 View  04/24/2015  CLINICAL DATA:  PICC line placement. EXAM: PORTABLE CHEST 1 VIEW COMPARISON:  04/22/2015 FINDINGS: Tracheostomy tube unchanged. No evidence of a PICC line on this examination. Multiple surgical clips over the right hilar region. Lungs are somewhat hypoinflated with no change in known large left lower lobe mass. Patient's known right infrahilar mass  is not as well visualized on today's exam. Subtle blunting of the costophrenic angles which may be due to a small amount of pleural fluid bilaterally. Stable moderate cardiomegaly. Remainder of the exam is unchanged. IMPRESSION: Stable moderate cardiomegaly. Possible small amount of bilateral pleural fluid. Stable large known left lower lobe mass as the known right infrahilar mass is not as well visualized on today's exam. Tracheostomy tube unchanged. No evidence of PICC line visualized on this exam. Electronically Signed   By: Marin Olp M.D.   On: 04/24/2015 08:38   Dg Chest Port 1 View  04/22/2015  CLINICAL DATA:  Respiratory difficulty, COPD, tracheostomy EXAM: PORTABLE CHEST 1 VIEW COMPARISON:  04/21/2015 FINDINGS: Limited portable exam, rotated to the right. Tracheostomy is 3 cm above the carina. Upper lobes remain clear. Bilateral lower lobe infrahilar masses better appreciated by CT comparison. Small pleural effusions noted. No significant interval change or pneumothorax. Postop changes in the right hilum. IMPRESSION: Stable tracheostomy and upper lobe aeration Large bilateral infrahilar lung masses Small pleural effusions Little interval change. Electronically Signed   By: Jerilynn Mages.  Shick M.D.   On: 04/22/2015 14:19    Scheduled Meds: . acetylcysteine  4 mL Nebulization BID  .  antiseptic oral rinse  7 mL Mouth Rinse q12n4p  . aspirin EC  81 mg Oral Daily  . chlorhexidine  15 mL Mouth Rinse BID  . Chlorhexidine Gluconate Cloth  6 each Topical Q0600  . citalopram  20 mg Per Tube Daily  . enoxaparin (LOVENOX) injection  40 mg Subcutaneous Q24H  . ferrous sulfate  325 mg Oral Daily  . fluconazole (DIFLUCAN) IV  400 mg Intravenous Q24H  . folic acid  1 mg Per Tube Daily  . free water  250 mL Per Tube 3 times per day  . insulin aspart  0-9 Units Subcutaneous TID WC  . ipratropium-albuterol  3 mL Nebulization Q4H  . loratadine  10 mg Per Tube Daily  . meropenem (MERREM) IV  1 g Intravenous 3 times per day  . [START ON 04/25/2015] methylPREDNISolone (SOLU-MEDROL) injection  40 mg Intravenous Daily  . metronidazole  500 mg Intravenous Q8H  . montelukast  10 mg Per Tube QHS  . multivitamin with minerals  1 tablet Oral Daily  . mupirocin ointment  1 application Nasal BID  . pantoprazole sodium  40 mg Per Tube BID  . sodium chloride  3 mL Intravenous Q12H  . tamsulosin  0.4 mg Oral Daily   Continuous Infusions: . feeding supplement (JEVITY 1.5 CAL/FIBER) Stopped (04/24/15 1246)       Time spent:36min    [place] A  Pulmonary Critical Care Medicine 04/24/2015, 1:08 PM  LOS: 3 days

## 2015-04-24 NOTE — Plan of Care (Signed)
Problem: Discharge Progression Outcomes Goal: Other Discharge Outcomes/Goals Outcome: Progressing VSS. Trach collar. No c/o sob. Suctioned pt once, blood tinged sputum. Denies pain. Tolerates feeding via PEG. Picc line placed today and in use. Up to the chair today. Contact precautions continues. Tressie Ellis initiated today.

## 2015-04-24 NOTE — Progress Notes (Signed)
ANTIBIOTIC/ANTIFUNGAL CONSULT NOTE - FOLLOW UP  Pharmacy Consult for Vancomycin, Merrem, and Fluconazle Indication: rule out pneumonia/bronchitis   Allergies  Allergen Reactions  . Morphine And Related Other (See Comments)    Reaction:  Altered mental status    Patient Measurements: Height: 5\' 11"  (180.3 cm) Weight: 173 lb 12.8 oz (78.835 kg) IBW/kg (Calculated) : 75.3 Adjusted Body Weight:   Vital Signs: Temp: 97.5 F (36.4 C) (10/28 0511) Temp Source: Oral (10/28 0511) BP: 120/43 mmHg (10/28 0511) Pulse Rate: 79 (10/28 0511) Intake/Output from previous day: 10/27 0701 - 10/28 0700 In: 1878 [NG/GT:750; IV Piggyback:100] Out: 555 [Urine:550; Drains:5] Intake/Output from this shift:    Labs:  Recent Labs  04/22/15 0556 04/23/15 0516 04/24/15 0537  WBC 12.0* 32.7* 26.8*  HGB 8.7* 8.8* 8.1*  PLT 232 355 295  CREATININE 0.69 0.91 0.65   Estimated Creatinine Clearance: 77.1 mL/min (by C-G formula based on Cr of 0.65).  Recent Labs  04/23/15 1355  Ramona 14     Microbiology: Recent Results (from the past 720 hour(s))  Culture, blood (routine x 2)     Status: None   Collection Time: 04/06/15  4:11 PM  Result Value Ref Range Status   Specimen Description BLOOD RIGHT ANTECUBITAL  Final   Special Requests BOTTLES DRAWN AEROBIC AND ANAEROBIC 10ML  Final   Culture NO GROWTH 5 DAYS  Final   Report Status 04/11/2015 FINAL  Final  Culture, blood (routine x 2)     Status: None   Collection Time: 04/06/15  4:11 PM  Result Value Ref Range Status   Specimen Description BLOOD LEFT  Final   Special Requests BOTTLES DRAWN AEROBIC AND ANAEROBIC 20ML  Final   Culture NO GROWTH 5 DAYS  Final   Report Status 04/11/2015 FINAL  Final  Culture, expectorated sputum-assessment     Status: None   Collection Time: 04/07/15  6:25 AM  Result Value Ref Range Status   Specimen Description SPUTUM  Final   Special Requests Immunocompromised  Final   Sputum evaluation THIS  SPECIMEN IS ACCEPTABLE FOR SPUTUM CULTURE  Final   Report Status 04/07/2015 FINAL  Final  Culture, respiratory (NON-Expectorated)     Status: None   Collection Time: 04/07/15  6:25 AM  Result Value Ref Range Status   Specimen Description SPUTUM  Final   Special Requests Immunocompromised Reflexed from [id number]  Final   Gram Stain   Final    MODERATE WBC SEEN MANY YEAST MANY GRAM POSITIVE RODS GOOD SPECIMEN - 80-90% WBCS    Culture   Final    HEAVY GROWTH CANDIDA TROPICALIS MODERATE GROWTH CANDIDA ALBICANS RARE ACINETOBACTER LWOFFII    Report Status 04/12/2015 FINAL  Final   Organism ID, Bacteria ACINETOBACTER LWOFFII  Final      Susceptibility   Acinetobacter lwoffii - MIC*    CEFTRIAXONE Value in next row Intermediate      INTERMEDIATE16    CIPROFLOXACIN Value in next row Resistant      RESISTANT>=4    GENTAMICIN Value in next row Sensitive      SENSITIVE<=1    IMIPENEM Value in next row Sensitive      SENSITIVE<=0.25    PIP/TAZO Value in next row Resistant      RESISTANT>=128    TRIMETH/SULFA Value in next row Sensitive      SENSITIVE<=20    CEFEPIME Value in next row Sensitive      SENSITIVE2    AMPICILLIN/SULBACTAM Value in next row Sensitive  SENSITIVE<=2    * RARE ACINETOBACTER LWOFFII  Culture, expectorated sputum-assessment     Status: None   Collection Time: 04/22/15 12:40 AM  Result Value Ref Range Status   Specimen Description SPUTUM  Final   Special Requests Normal  Final   Sputum evaluation THIS SPECIMEN IS ACCEPTABLE FOR SPUTUM CULTURE  Final   Report Status 04/23/2015 FINAL  Final  Culture, respiratory (NON-Expectorated)     Status: None (Preliminary result)   Collection Time: 04/22/15 12:40 AM  Result Value Ref Range Status   Specimen Description SPUTUM  Final   Special Requests Normal Reflexed from [id number]  Final   Gram Stain PENDING  Incomplete   Culture   Final    HEAVY GROWTH GRAM NEGATIVE RODS IDENTIFICATION AND SUSCEPTIBILITIES TO FOLLOW  ONCE ISOLATED    Report Status PENDING  Incomplete  Fungus Culture with Smear     Status: None (Preliminary result)   Collection Time: 04/22/15  3:14 PM  Result Value Ref Range Status   Specimen Description BRONCHIAL ALVEOLAR LAVAGE  Final   Special Requests Normal  Final   Fungal Smear PENDING  Incomplete   Culture NO FUNGUS ISOLATED <24 HOURS  Final   Report Status PENDING  Incomplete  Culture, bal-quantitative     Status: None (Preliminary result)   Collection Time: 04/22/15  3:14 PM  Result Value Ref Range Status   Specimen Description BRONCHIAL ALVEOLAR LAVAGE  Final   Special Requests Normal  Final   Gram Stain PENDING  Incomplete   Culture   Final    HEAVY GROWTH GRAM NEGATIVE RODS IDENTIFICATION AND SUSCEPTIBILITIES TO FOLLOW    Report Status PENDING  Incomplete  MRSA PCR Screening     Status: None   Collection Time: 04/22/15  5:23 PM  Result Value Ref Range Status   MRSA by PCR NEGATIVE NEGATIVE Final    Comment:        The GeneXpert MRSA Assay (FDA approved for NASAL specimens only), is one component of a comprehensive MRSA colonization surveillance program. It is not intended to diagnose MRSA infection nor to guide or monitor treatment for MRSA infections.     Anti-infectives    Start     Dose/Rate Route Frequency Ordered Stop   04/24/15 0330  vancomycin (VANCOCIN) 1,250 mg in sodium chloride 0.9 % 250 mL IVPB     1,250 mg 166.7 mL/hr over 90 Minutes Intravenous Every 12 hours 04/23/15 1644     04/23/15 1530  metroNIDAZOLE (FLAGYL) IVPB 500 mg     500 mg 100 mL/hr over 60 Minutes Intravenous Every 8 hours 04/23/15 1522     04/23/15 1530  meropenem (MERREM) 1 g in sodium chloride 0.9 % 100 mL IVPB     1 g 200 mL/hr over 30 Minutes Intravenous 3 times per day 04/23/15 1526     04/22/15 1800  fluconazole (DIFLUCAN) IVPB 400 mg     400 mg 100 mL/hr over 120 Minutes Intravenous Every 24 hours 04/22/15 1639     04/22/15 1730  fluconazole (DIFLUCAN) IVPB 400  mg  Status:  Discontinued     400 mg 100 mL/hr over 120 Minutes Intravenous Every 24 hours 04/22/15 1638 04/22/15 1639   04/22/15 0600  piperacillin-tazobactam (ZOSYN) IVPB 3.375 g  Status:  Discontinued     3.375 g 12.5 mL/hr over 240 Minutes Intravenous 3 times per day 04/21/15 1904 04/21/15 2144   04/22/15 0400  piperacillin-tazobactam (ZOSYN) IVPB 3.375 g  Status:  Discontinued  3.375 g 12.5 mL/hr over 240 Minutes Intravenous 3 times per day 04/21/15 2206 04/23/15 1522   04/22/15 0100  vancomycin (VANCOCIN) IVPB 1000 mg/200 mL premix  Status:  Discontinued     1,000 mg 200 mL/hr over 60 Minutes Intravenous Every 12 hours 04/21/15 1856 04/21/15 2144   04/22/15 0100  vancomycin (VANCOCIN) IVPB 1000 mg/200 mL premix  Status:  Discontinued     1,000 mg 200 mL/hr over 60 Minutes Intravenous Every 12 hours 04/21/15 2205 04/23/15 1643   04/21/15 2200  piperacillin-tazobactam (ZOSYN) IVPB 3.375 g  Status:  Discontinued     3.375 g 100 mL/hr over 30 Minutes Intravenous 3 times per day 04/21/15 1848 04/21/15 1903   04/21/15 2000  piperacillin-tazobactam (ZOSYN) IVPB 3.375 g     3.375 g 100 mL/hr over 30 Minutes Intravenous  Once 04/21/15 1903 04/21/15 2007   04/21/15 1900  vancomycin (VANCOCIN) IVPB 1000 mg/200 mL premix     1,000 mg 200 mL/hr over 60 Minutes Intravenous  Once 04/21/15 1848 04/21/15 2043      Assessment: Patient with a history of pharyngeal cancer and admitted for acute respiratory failure. Patient previously admitted to the critical care unit. Bronch cultures growing Gram negative rods.   Goal of Therapy:  Vancomycin trough level 15-20 mcg/ml  Plan:  Follow up culture results   1) Vancomycin: Will continue Vancomycin 1250 mg IV q12 hours. Trough level ordered for 10/29 @ 0300.  2) Merrem: Continue Merrem 1 g IV q 8 hours. 3) Fluconazole: Will continue Fluconazole 400 mg IV q24 hours.   Pharmacy to follow and suggest de-escalation when appropriate.     Kellye Mizner D 04/24/2015,8:28 AM

## 2015-04-24 NOTE — Plan of Care (Signed)
Problem: Discharge Progression Outcomes Goal: Tolerating diet Outcome: Not Applicable Date Met:  16/42/90 Pt NPO on TF Goal: Other Discharge Outcomes/Goals Outcome: Progressing Assumed care at 2300. Pt transferred from CCU yesterday. Trach collar. Denies dyspnea. Up in chair all night. Foam dressing in place to provide protection from skin breakdown.  Ambien given for sleep with some relief. No c/o pain.  Vital signs stable.  I&D consulted. Pt on numerous iv antibiotics.  Continues on contact isolation.

## 2015-04-24 NOTE — Progress Notes (Addendum)
[facility] [place] NAME: Stephen Taylor    MR#:  [id number]  DATE OF BIRTH:  Oct 27, 1931  SUBJECTIVE:  CHIEF COMPLAINT:   Chief Complaint  Patient presents with  . Shortness of Breath   Patient here due to shortness of breath, cough, productive sputum. 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 S/p Bronchoscopy & clean out 10/26 and feels much better.  Pt. Lost IV Access last night and is hard stick and will get PICC line today.  Shortness of breath improved.    REVIEW OF SYSTEMS:    Review of Systems  Constitutional: Negative for fever and chills.  HENT: Negative for congestion and tinnitus.   Eyes: Negative for blurred vision and double vision.  Respiratory: Positive for cough, sputum production and shortness of breath. Negative for wheezing.   Cardiovascular: Negative for chest pain, orthopnea and PND.  Gastrointestinal: Negative for nausea, vomiting, abdominal pain and diarrhea.  Genitourinary: Negative for dysuria and hematuria.  Neurological: Positive for weakness (generalized). Negative for dizziness, sensory change and focal weakness.  All other systems reviewed and are negative.   Nutrition: Tube feedings Tolerating Diet: Yes Tolerating PT: Await evaluation   DRUG ALLERGIES:   Allergies  Allergen Reactions  . Morphine And Related Other (See Comments)    Reaction:  Altered mental status    VITALS:  Blood pressure 120/43, pulse 79, temperature 97.5 F (36.4 C), temperature source Oral, resp. rate 20, height 5\' 11"  (1.803 m), weight 78.835 kg (173 lb 12.8 oz), SpO2 97 %.  PHYSICAL EXAMINATION:   Physical Exam  GENERAL:  81 y.o.-year-old patient sitting up in chair in NAD.   EYES: Pupils equal, round, reactive to light and accommodation. No scleral icterus. Extraocular muscles intact.  HEENT: Head atraumatic, normocephalic. Oropharynx and nasopharynx clear.  + Tracheostomy with  Trach Collar.   NECK:  Supple, no jugular venous distention. No thyroid enlargement, no tenderness.  LUNGS: Good resp. effort, no wheezing, rales, rhonchi. No use of accessory muscles of respiration.  CARDIOVASCULAR: S1, S2 normal. 2/6 systolic murmur at the base, rubs, or gallops.  ABDOMEN: Soft, nontender, nondistended. Bowel sounds present. No organomegaly or mass.  EXTREMITIES: No cyanosis, clubbing or edema b/l.    NEUROLOGIC: Cranial nerves II through XII are intact. No focal Motor or sensory deficits b/l.  Globally weak PSYCHIATRIC: The patient is alert and oriented x 3. Good affect.  SKIN: No obvious rash, lesion, or ulcer.    LABORATORY PANEL:   CBC  Recent Labs Lab 04/24/15 0537  WBC 26.8*  HGB 8.1*  HCT 27.9*  PLT 295   ------------------------------------------------------------------------------------------------------------------  Chemistries   Recent Labs Lab 04/24/15 0537  NA 142  K 5.0  CL 99*  CO2 36*  GLUCOSE 221*  BUN 34*  CREATININE 0.65  CALCIUM 9.4   ------------------------------------------------------------------------------------------------------------------  Cardiac Enzymes  Recent Labs Lab 04/21/15 1742  TROPONINI <0.03   ------------------------------------------------------------------------------------------------------------------  RADIOLOGY:  Dg Chest Port 1 View  04/24/2015  CLINICAL DATA:  PICC line placement. EXAM: PORTABLE CHEST 1 VIEW COMPARISON:  04/22/2015 FINDINGS: Tracheostomy tube unchanged. No evidence of a PICC line on this examination. Multiple surgical clips over the right hilar region. Lungs are somewhat hypoinflated with no change in known large left lower lobe mass. Patient's known right infrahilar mass is not as well visualized on today's exam. Subtle blunting of the costophrenic angles which may be due to a small amount of pleural fluid bilaterally.  Stable moderate cardiomegaly. Remainder of the exam is  unchanged. IMPRESSION: Stable moderate cardiomegaly. Possible small amount of bilateral pleural fluid. Stable large known left lower lobe mass as the known right infrahilar mass is not as well visualized on today's exam. Tracheostomy tube unchanged. No evidence of PICC line visualized on this exam. Electronically Signed   By: Marin Olp M.D.   On: 04/24/2015 08:38   Dg Chest Port 1 View  04/22/2015  CLINICAL DATA:  Respiratory difficulty, COPD, tracheostomy EXAM: PORTABLE CHEST 1 VIEW COMPARISON:  04/21/2015 FINDINGS: Limited portable exam, rotated to the right. Tracheostomy is 3 cm above the carina. Upper lobes remain clear. Bilateral lower lobe infrahilar masses better appreciated by CT comparison. Small pleural effusions noted. No significant interval change or pneumothorax. Postop changes in the right hilum. IMPRESSION: Stable tracheostomy and upper lobe aeration Large bilateral infrahilar lung masses Small pleural effusions Little interval change. Electronically Signed   By: Jerilynn Mages.  Shick M.D.   On: 04/22/2015 14:19     ASSESSMENT AND PLAN:   81 year old male with past medical history of laryngeal cancer status post tracheostomy, history of prostate cancer, COPD, history of previous CVA, depression, BPH, osteoarthritis, chronic anemia, history of coronary artery disease, who presents to the hospital due to shortness of breath and weakness and noted to be in acute on chronic respiratory failure and also noted to have a pneumonia.  #1 Acute on chronic respiratory failure- secondary to worsening pneumonia-healthcare acquired pneumonia - Patient is status post tracheostomy due to previous history of laryngeal cancer. - s/p Bronch 10/26 and clean out and feels much better. BAL from Bronch growing gram (-) rod but ID pending.  - will d/c Vanc, and cont. Zoysn.  Continue aggressive pulmonary toileting. -Appreciate pulmonary help.   #2 COPD-with acute exacerbation, due to underlying  pneumonia. -Continue IV Solu-Medrol but will taper, cont. DuoNeb's, frequent suctioning and aggressive pulmonary toileting. -d/c Vanc, cont. Zosyn for HCAP as sputum Cx growing gram (-) rod.    #3 prostate cancer-following with urologist. Has adenocarcinoma of the prostate -Received six-month Lupron injection in August 2016 -Continue outpatient follow-up.  Continue Flomax  #4 Barrett's esophagus-continue outpatient monitoring. -Continue Protonix  #5 depression-continue Celexa   All the records are reviewed and case discussed with Care Management/Social Workerr. Management plans discussed with the patient, family and they are in agreement.  CODE STATUS: Full  DVT Prophylaxis: Lovenox  TOTAL TIME TAKING CARE OF THIS PATIENT: 30 minutes.   POSSIBLE D/C IN 1-2 DAYS, DEPENDING ON CLINICAL CONDITION.   Henreitta Leber M.D on 04/24/2015 at 12:20 PM  Between 7am to 6pm - Pager - [phone]  After 6pm go to [email or web address] - password [email or web address]  Office  [phone]  CC: Primary care physician; Lavera Guise, MD

## 2015-04-25 DIAGNOSIS — J984 Other disorders of lung: Secondary | ICD-10-CM

## 2015-04-25 LAB — CBC
HEMATOCRIT: 28 % — AB (ref 40.0–52.0)
HEMOGLOBIN: 8.3 g/dL — AB (ref 13.0–18.0)
MCH: 20.8 pg — AB (ref 26.0–34.0)
MCHC: 29.8 g/dL — AB (ref 32.0–36.0)
MCV: 69.9 fL — ABNORMAL LOW (ref 80.0–100.0)
Platelets: 296 10*3/uL (ref 150–440)
RBC: 4.01 MIL/uL — ABNORMAL LOW (ref 4.40–5.90)
RDW: 19.6 % — ABNORMAL HIGH (ref 11.5–14.5)
WBC: 18.5 10*3/uL — ABNORMAL HIGH (ref 3.8–10.6)

## 2015-04-25 LAB — GLUCOSE, CAPILLARY
Glucose-Capillary: 181 mg/dL — ABNORMAL HIGH (ref 65–99)
Glucose-Capillary: 182 mg/dL — ABNORMAL HIGH (ref 65–99)
Glucose-Capillary: 179 mg/dL — ABNORMAL HIGH (ref 65–99)
Glucose-Capillary: 272 mg/dL — ABNORMAL HIGH (ref 65–99)

## 2015-04-25 LAB — BASIC METABOLIC PANEL
Anion gap: 5 (ref 5–15)
BUN: 33 mg/dL — ABNORMAL HIGH (ref 6–20)
CHLORIDE: 98 mmol/L — AB (ref 101–111)
CO2: 41 mmol/L — AB (ref 22–32)
CREATININE: 0.6 mg/dL — AB (ref 0.61–1.24)
Calcium: 9.1 mg/dL (ref 8.9–10.3)
GFR calc non Af Amer: >60 mL/min (ref >60)
Glucose, Bld: 198 mg/dL — ABNORMAL HIGH (ref 65–99)
Potassium: 4.7 mmol/L (ref 3.5–5.1)
Sodium: 144 mmol/L (ref 135–145)

## 2015-04-25 MED ORDER — INFLUENZA VAC SPLIT QUAD 0.5 ML IM SUSY
0.5000 mL | PREFILLED_SYRINGE | INTRAMUSCULAR | Status: AC
  Administered 2015-04-26: 0.5 mL via INTRAMUSCULAR
  Filled 2015-04-25: qty 0.5

## 2015-04-25 NOTE — Plan of Care (Signed)
Problem: Discharge Progression Outcomes Goal: Dyspnea controlled Outcome: Progressing Individualization of Care Pt prefers to be called Josede Hx of laryngeal and prostate cancer, COPD, CAD, stroke, nephrolithiasis, depression, MRSA pneumonia, BPH, anemia, osteoarthritis    Goal: Other Discharge Outcomes/Goals Pain:  Patient without complaints of pain this shift.  O2 sats:  Patient remains on 10L O2.  Nebulizer treatments Q4 hours.  Hemodynamically:  Patient afebrile and VSS this shift.  IV Fortaz given x 2 per eMAR.  BP 121/53 mmHg  Pulse 83  Temp(Src) 97.6 F (36.4 C) (Oral)  Resp 18  Ht 5\' 11"  (1.803 m)  Wt 173 lb 12.8 oz (78.835 kg)  BMI 24.25 kg/m2  SpO2 97% Activity:  Patient is working with PT and OT.  Unsteady on feet is moderate assist this shift. Complications:  No complications this shift.  Patient rested comfortably throughout shift. Diet:  Medications crushed through PEG tube.  Patient on Jevity 1.5 at 95 mL/hr throughout shift with 250 mL free water Q8.

## 2015-04-25 NOTE — Progress Notes (Signed)
Patient ID: Stephen Taylor, male   DOB: 1932-06-05, 81 y.o.   MRN: [id number] [facility] PROGRESS NOTE  PCP: Lavera Guise, MD  HPI/Subjective: Patient feeling better now. He had respiratory distress earlier required nebulizer treatment and suctioning. He is hoping to go home soon.  Objective: Filed Vitals:   04/25/15 1021  BP: 126/46  Pulse: 75  Temp: 98 F (36.7 C)  Resp: 18    Filed Weights   04/21/15 1734  Weight: 78.835 kg (173 lb 12.8 oz)    ROS: Review of Systems  Constitutional: Negative for fever and chills.  Eyes: Negative for blurred vision.  Respiratory: Positive for shortness of breath. Negative for cough.   Cardiovascular: Negative for chest pain.  Gastrointestinal: Negative for nausea, vomiting, abdominal pain, diarrhea and constipation.  Genitourinary: Negative for dysuria.  Musculoskeletal: Negative for joint pain.  Neurological: Negative for dizziness and headaches.   Exam: Physical Exam  Constitutional: He is oriented to person, place, and time.  HENT:  Nose: No mucosal edema.  Mouth/Throat: No oropharyngeal exudate or posterior oropharyngeal edema.  Eyes: Conjunctivae, EOM and lids are normal. Pupils are equal, round, and reactive to light.  Neck: No JVD present. Carotid bruit is not present. No edema present. No thyroid mass and no thyromegaly present.  Cardiovascular: S1 normal and S2 normal.  Exam reveals no gallop.   Murmur heard.  Systolic murmur is present with a grade of 3/6  Pulses:      Dorsalis pedis pulses are 2+ on the right side, and 2+ on the left side.  Respiratory: No respiratory distress. He has no wheezes. He has rhonchi in the right lower field and the left lower field. He has no rales.  GI: Soft. Bowel sounds are normal. There is no tenderness.  Musculoskeletal:       Right ankle: He exhibits no swelling.       Left ankle: He exhibits no swelling.  Lymphadenopathy:    He has no cervical adenopathy.   Neurological: He is alert and oriented to person, place, and time. No cranial nerve deficit.  Skin: Skin is warm. No rash noted. Nails show no clubbing.  Psychiatric: He has a normal mood and affect.    Data Reviewed: Basic Metabolic Panel:  Recent Labs Lab 04/21/15 1742 04/22/15 0556 04/23/15 0516 04/24/15 0537 04/25/15 0555  NA 133* 136 140 142 144  K 4.7 4.5 4.9 5.0 4.7  CL 93* 94* 96* 99* 98*  CO2 31 36* 35* 36* 41*  GLUCOSE 183* 225* 371* 221* 198*  BUN 24* 20 30* 34* 33*  CREATININE 0.68 0.69 0.91 0.65 0.60*  CALCIUM 8.6* 8.9 9.5 9.4 9.1   CBC:  Recent Labs Lab 04/21/15 1742 04/22/15 0556 04/23/15 0516 04/24/15 0537 04/25/15 0555  WBC 13.9* 12.0* 32.7* 26.8* 18.5*  NEUTROABS 11.1*  --   --   --   --   HGB 8.2* 8.7* 8.8* 8.1* 8.3*  HCT 27.9* 28.8* 30.2* 27.9* 28.0*  MCV 69.2* 69.6* 70.1* 69.8* 69.9*  PLT 240 232 355 295 296   Cardiac Enzymes:  Recent Labs Lab 04/21/15 1742  TROPONINI <0.03   BNP (last 3 results)  Recent Labs  11/06/14 1231 04/06/15 1558  BNP 128.0* 286.0*    CBG:  Recent Labs Lab 04/24/15 1149 04/24/15 1649 04/24/15 2124 04/25/15 0751 04/25/15 1146  GLUCAP 239* 113* 218* 181* 182*    Recent Results (from the past 240 hour(s))  Culture, expectorated sputum-assessment  Status: None   Collection Time: 04/22/15 12:40 AM  Result Value Ref Range Status   Specimen Description SPUTUM  Final   Special Requests Normal  Final   Sputum evaluation THIS SPECIMEN IS ACCEPTABLE FOR SPUTUM CULTURE  Final   Report Status 04/23/2015 FINAL  Final  Culture, respiratory (NON-Expectorated)     Status: None (Preliminary result)   Collection Time: 04/22/15 12:40 AM  Result Value Ref Range Status   Specimen Description SPUTUM  Final   Special Requests Normal Reflexed from [id number]  Final   Gram Stain PENDING  Incomplete   Culture   Final    HEAVY GROWTH PSEUDOMONAS AERUGINOSA HEAVY GROWTH KLEBSIELLA PNEUMONIAE HEAVY GROWTH GRAM  NEGATIVE RODS IDENTIFICATION AND SUSCEPTIBILITIES TO FOLLOW    Report Status PENDING  Incomplete   Organism ID, Bacteria PSEUDOMONAS AERUGINOSA  Final   Organism ID, Bacteria KLEBSIELLA PNEUMONIAE  Final      Susceptibility   Klebsiella pneumoniae - MIC*    AMPICILLIN >=32 RESISTANT Resistant     CEFAZOLIN <=4 SENSITIVE Sensitive     CEFTRIAXONE <=1 SENSITIVE Sensitive     CIPROFLOXACIN <=0.25 SENSITIVE Sensitive     GENTAMICIN <=1 SENSITIVE Sensitive     IMIPENEM <=0.25 SENSITIVE Sensitive     NITROFURANTOIN 64 INTERMEDIATE Intermediate     TRIMETH/SULFA >=320 RESISTANT Resistant     PIP/TAZO Value in next row Sensitive      SENSITIVE<=4    LEVOFLOXACIN Value in next row Sensitive      SENSITIVE<=0.12    AMPICILLIN/SULBACTAM Value in next row Intermediate      INTERMEDIATE16    CEFTAZIDIME Value in next row Sensitive      SENSITIVE<=4    * HEAVY GROWTH KLEBSIELLA PNEUMONIAE   Pseudomonas aeruginosa - MIC*    CEFTAZIDIME Value in next row Sensitive      SENSITIVE<=4    CIPROFLOXACIN Value in next row Sensitive      SENSITIVE<=4    GENTAMICIN Value in next row Sensitive      SENSITIVE<=4    IMIPENEM Value in next row Sensitive      SENSITIVE<=4    PIP/TAZO Value in next row Sensitive      SENSITIVE<=4    * HEAVY GROWTH PSEUDOMONAS AERUGINOSA  Fungus Culture with Smear     Status: None (Preliminary result)   Collection Time: 04/22/15  3:14 PM  Result Value Ref Range Status   Specimen Description BRONCHIAL ALVEOLAR LAVAGE  Final   Special Requests Normal  Final   Culture NO FUNGUS ISOLATED AFTER 3 DAYS  Final   Report Status PENDING  Incomplete  Culture, bal-quantitative     Status: None (Preliminary result)   Collection Time: 04/22/15  3:14 PM  Result Value Ref Range Status   Specimen Description BRONCHIAL ALVEOLAR LAVAGE  Final   Special Requests Normal  Final   Gram Stain   Final    MANY WBC SEEN MANY GRAM NEGATIVE RODS RARE GRAM POSITIVE COCCI IN  CLUSTERS EXCELLENT SPECIMEN - 90-100% WBCS    Culture   Final    HEAVY GROWTH PSEUDOMONAS AERUGINOSA HEAVY GROWTH ENTEROBACTER AEROGENES    Report Status PENDING  Incomplete   Organism ID, Bacteria PSEUDOMONAS AERUGINOSA  Final   Organism ID, Bacteria ENTEROBACTER AEROGENES  Final      Susceptibility   Enterobacter aerogenes - MIC*    CEFTAZIDIME Value in next row Sensitive      SENSITIVE4    CEFAZOLIN Value in next row Resistant  RESISTANT>=64    CEFTRIAXONE Value in next row Sensitive      SENSITIVE4    CIPROFLOXACIN Value in next row Sensitive      SENSITIVE<=0.25    GENTAMICIN Value in next row Sensitive      SENSITIVE<=1    IMIPENEM Value in next row Sensitive      SENSITIVE<=1    PIP/TAZO Value in next row Intermediate      INTERMEDIATE32    LEVOFLOXACIN Value in next row Sensitive      SENSITIVE<=0.12    * HEAVY GROWTH ENTEROBACTER AEROGENES   Pseudomonas aeruginosa - MIC*    CEFTAZIDIME Value in next row Sensitive      SENSITIVE4    CIPROFLOXACIN Value in next row Sensitive      SENSITIVE<=0.25    GENTAMICIN Value in next row Sensitive      SENSITIVE<=1    IMIPENEM Value in next row Sensitive      SENSITIVE1    PIP/TAZO Value in next row Sensitive      SENSITIVE8    * HEAVY GROWTH PSEUDOMONAS AERUGINOSA  MRSA PCR Screening     Status: None   Collection Time: 04/22/15  5:23 PM  Result Value Ref Range Status   MRSA by PCR NEGATIVE NEGATIVE Final    Comment:        The GeneXpert MRSA Assay (FDA approved for NASAL specimens only), is one component of a comprehensive MRSA colonization surveillance program. It is not intended to diagnose MRSA infection nor to guide or monitor treatment for MRSA infections.      Studies: Dg Chest Port 1 View  04/24/2015  CLINICAL DATA:  PICC line placement. EXAM: PORTABLE CHEST 1 VIEW COMPARISON:  Same day. FINDINGS: Stable cardiomegaly. Tracheostomy is unchanged in position. Interval placement of right-sided PICC  line with distal tip in expected position of the SVC. No pneumothorax or significant pleural effusion is noted. Large left lower lobe mass is again noted. IMPRESSION: Stable large left lower lobe lung mass. Interval placement of right-sided PICC line with distal tip in expected position of the SVC. Electronically Signed   By: Marijo Conception, M.D.   On: 04/24/2015 12:50   Dg Chest Port 1 View  04/24/2015  CLINICAL DATA:  PICC line placement. EXAM: PORTABLE CHEST 1 VIEW COMPARISON:  04/22/2015 FINDINGS: Tracheostomy tube unchanged. No evidence of a PICC line on this examination. Multiple surgical clips over the right hilar region. Lungs are somewhat hypoinflated with no change in known large left lower lobe mass. Patient's known right infrahilar mass is not as well visualized on today's exam. Subtle blunting of the costophrenic angles which may be due to a small amount of pleural fluid bilaterally. Stable moderate cardiomegaly. Remainder of the exam is unchanged. IMPRESSION: Stable moderate cardiomegaly. Possible small amount of bilateral pleural fluid. Stable large known left lower lobe mass as the known right infrahilar mass is not as well visualized on today's exam. Tracheostomy tube unchanged. No evidence of PICC line visualized on this exam. Electronically Signed   By: Marin Olp M.D.   On: 04/24/2015 08:38    Scheduled Meds: . acetylcysteine  4 mL Nebulization BID  . antiseptic oral rinse  7 mL Mouth Rinse q12n4p  . aspirin EC  81 mg Oral Daily  . cefTAZidime (FORTAZ)  IV  2 g Intravenous 3 times per day  . chlorhexidine  15 mL Mouth Rinse BID  . citalopram  20 mg Per Tube Daily  . enoxaparin (LOVENOX)  injection  40 mg Subcutaneous Q24H  . ferrous sulfate  325 mg Oral Daily  . folic acid  1 mg Per Tube Daily  . free water  250 mL Per Tube 3 times per day  . [START ON 04/26/2015] Influenza vac split quadrivalent PF  0.5 mL Intramuscular Tomorrow-1000  . insulin aspart  0-9 Units  Subcutaneous TID WC  . ipratropium-albuterol  3 mL Nebulization QID  . itraconazole  100 mg Oral Daily  . loratadine  10 mg Per Tube Daily  . methylPREDNISolone (SOLU-MEDROL) injection  40 mg Intravenous Daily  . montelukast  10 mg Per Tube QHS  . multivitamin with minerals  1 tablet Oral Daily  . mupirocin ointment  1 application Nasal BID  . pantoprazole sodium  40 mg Per Tube BID  . sodium chloride  3 mL Intravenous Q12H  . tamsulosin  0.4 mg Oral Daily   Continuous Infusions: . feeding supplement (JEVITY 1.5 CAL/FIBER) 1,000 mL (04/25/15 1017)    Assessment/Plan:  1. Acute on chronic respiratory failure. Continue trach collar. Bronchoscopy on 1026. 2. Healthcare associated pneumonia- patient was on aggressive antibiotics. Patient switched  Over to ceftazidime to cover Pseudomonas and Klebsiella. Will need prolonged course of abx. 3. COPD exacerbation- patient is on 40mg  iv solumedrol daily 4. Fungal infection lungs- on itaconazole 5. slepp apnea 6. GERD- protonix 7. Depression- continue celexa   Code Status:     Code Status Orders        Start     Ordered   04/21/15 2145  Full code   Continuous     04/21/15 2144    Advance Directive Documentation        Most Recent Value   Type of Advance Directive  Healthcare Power of Attorney   Pre-existing out of facility DNR order (yellow form or pink MOST form)     "MOST" Form in Place?       Family Communication: wife at bedside Disposition Plan: home with home health - potentially Monday  Consultants:  Pulmonary  Infectious disease  Antibiotics:  Ceftazidime  Itraconazole  Time spent: 28minutes  Loletha Grayer  [facility]

## 2015-04-25 NOTE — Progress Notes (Signed)
No new complaints. No distress  Filed Vitals:   04/25/15 0449 04/25/15 0721 04/25/15 1021 04/25/15 1149  BP: 121/53  126/46   Pulse: 83  75   Temp: 97.6 F (36.4 C)  98 F (36.7 C)   TempSrc: Oral  Oral   Resp: 18  18   Height:      Weight:      SpO2: 97% 99% 97% 97%   NAD Wet quality to voice Trach in place Diminished BS, no wheezes, bibasilar crackles Reg, no M Abd soft, +BS No LE edema  BMP Latest Ref Rng 04/25/2015 04/24/2015 04/23/2015  Glucose 65 - 99 mg/dL 198(H) 221(H) 371(H)  BUN 6 - 20 mg/dL 33(H) 34(H) 30(H)  Creatinine 0.61 - 1.24 mg/dL 0.60(L) 0.65 0.91  Sodium 135 - 145 mmol/L 144 142 140  Potassium 3.5 - 5.1 mmol/L 4.7 5.0 4.9  Chloride 101 - 111 mmol/L 98(L) 99(L) 96(L)  CO2 22 - 32 mmol/L 41(H) 36(H) 35(H)  Calcium 8.9 - 10.3 mg/dL 9.1 9.4 9.5    CBC Latest Ref Rng 04/25/2015 04/24/2015 04/23/2015  WBC 3.8 - 10.6 K/uL 18.5(H) 26.8(H) 32.7(H)  Hemoglobin 13.0 - 18.0 g/dL 8.3(L) 8.1(L) 8.8(L)  Hematocrit 40.0 - 52.0 % 28.0(L) 27.9(L) 30.2(L)  Platelets 150 - 440 K/uL 296 295 355   Anti-infectives    Start     Dose/Rate Route Frequency Ordered Stop   04/24/15 1500  itraconazole (SPORANOX) capsule 100 mg     100 mg Oral Daily 04/24/15 1315     04/24/15 1430  cefTAZidime (FORTAZ) 2 g in dextrose 5 % 50 mL IVPB     2 g 100 mL/hr over 30 Minutes Intravenous 3 times per day 04/24/15 1419     04/24/15 0330  vancomycin (VANCOCIN) 1,250 mg in sodium chloride 0.9 % 250 mL IVPB  Status:  Discontinued     1,250 mg 166.7 mL/hr over 90 Minutes Intravenous Every 12 hours 04/23/15 1644 04/24/15 1224   04/23/15 1530  metroNIDAZOLE (FLAGYL) IVPB 500 mg  Status:  Discontinued     500 mg 100 mL/hr over 60 Minutes Intravenous Every 8 hours 04/23/15 1522 04/24/15 1401   04/23/15 1530  meropenem (MERREM) 1 g in sodium chloride 0.9 % 100 mL IVPB  Status:  Discontinued     1 g 200 mL/hr over 30 Minutes Intravenous 3 times per day 04/23/15 1526 04/24/15 1419   04/22/15  1800  fluconazole (DIFLUCAN) IVPB 400 mg  Status:  Discontinued     400 mg 100 mL/hr over 120 Minutes Intravenous Every 24 hours 04/22/15 1639 04/24/15 1315   04/22/15 1730  fluconazole (DIFLUCAN) IVPB 400 mg  Status:  Discontinued     400 mg 100 mL/hr over 120 Minutes Intravenous Every 24 hours 04/22/15 1638 04/22/15 1639   04/22/15 0600  piperacillin-tazobactam (ZOSYN) IVPB 3.375 g  Status:  Discontinued     3.375 g 12.5 mL/hr over 240 Minutes Intravenous 3 times per day 04/21/15 1904 04/21/15 2144   04/22/15 0400  piperacillin-tazobactam (ZOSYN) IVPB 3.375 g  Status:  Discontinued     3.375 g 12.5 mL/hr over 240 Minutes Intravenous 3 times per day 04/21/15 2206 04/23/15 1522   04/22/15 0100  vancomycin (VANCOCIN) IVPB 1000 mg/200 mL premix  Status:  Discontinued     1,000 mg 200 mL/hr over 60 Minutes Intravenous Every 12 hours 04/21/15 1856 04/21/15 2144   04/22/15 0100  vancomycin (VANCOCIN) IVPB 1000 mg/200 mL premix  Status:  Discontinued  1,000 mg 200 mL/hr over 60 Minutes Intravenous Every 12 hours 04/21/15 2205 04/23/15 1643   04/21/15 2200  piperacillin-tazobactam (ZOSYN) IVPB 3.375 g  Status:  Discontinued     3.375 g 100 mL/hr over 30 Minutes Intravenous 3 times per day 04/21/15 1848 04/21/15 1903   04/21/15 2000  piperacillin-tazobactam (ZOSYN) IVPB 3.375 g     3.375 g 100 mL/hr over 30 Minutes Intravenous  Once 04/21/15 1903 04/21/15 2007   04/21/15 1900  vancomycin (VANCOCIN) IVPB 1000 mg/200 mL premix     1,000 mg 200 mL/hr over 60 Minutes Intravenous  Once 04/21/15 1848 04/21/15 2043     Results for orders placed or performed during the hospital encounter of 04/21/15  Culture, expectorated sputum-assessment     Status: None   Collection Time: 04/22/15 12:40 AM  Result Value Ref Range Status   Specimen Description SPUTUM  Final   Special Requests Normal  Final   Sputum evaluation THIS SPECIMEN IS ACCEPTABLE FOR SPUTUM CULTURE  Final   Report Status 04/23/2015  FINAL  Final  Culture, respiratory (NON-Expectorated)     Status: None (Preliminary result)   Collection Time: 04/22/15 12:40 AM  Result Value Ref Range Status   Specimen Description SPUTUM  Final   Special Requests Normal Reflexed from [id number]  Final   Gram Stain PENDING  Incomplete   Culture   Final    HEAVY GROWTH PSEUDOMONAS AERUGINOSA HEAVY GROWTH KLEBSIELLA PNEUMONIAE HEAVY GROWTH GRAM NEGATIVE RODS IDENTIFICATION AND SUSCEPTIBILITIES TO FOLLOW    Report Status PENDING  Incomplete   Organism ID, Bacteria PSEUDOMONAS AERUGINOSA  Final   Organism ID, Bacteria KLEBSIELLA PNEUMONIAE  Final      Susceptibility   Klebsiella pneumoniae - MIC*    AMPICILLIN >=32 RESISTANT Resistant     CEFAZOLIN <=4 SENSITIVE Sensitive     CEFTRIAXONE <=1 SENSITIVE Sensitive     CIPROFLOXACIN <=0.25 SENSITIVE Sensitive     GENTAMICIN <=1 SENSITIVE Sensitive     IMIPENEM <=0.25 SENSITIVE Sensitive     NITROFURANTOIN 64 INTERMEDIATE Intermediate     TRIMETH/SULFA >=320 RESISTANT Resistant     PIP/TAZO Value in next row Sensitive      SENSITIVE<=4    LEVOFLOXACIN Value in next row Sensitive      SENSITIVE<=0.12    AMPICILLIN/SULBACTAM Value in next row Intermediate      INTERMEDIATE16    CEFTAZIDIME Value in next row Sensitive      SENSITIVE<=4    * HEAVY GROWTH KLEBSIELLA PNEUMONIAE   Pseudomonas aeruginosa - MIC*    CEFTAZIDIME Value in next row Sensitive      SENSITIVE<=4    CIPROFLOXACIN Value in next row Sensitive      SENSITIVE<=4    GENTAMICIN Value in next row Sensitive      SENSITIVE<=4    IMIPENEM Value in next row Sensitive      SENSITIVE<=4    PIP/TAZO Value in next row Sensitive      SENSITIVE<=4    * HEAVY GROWTH PSEUDOMONAS AERUGINOSA  Fungus Culture with Smear     Status: None (Preliminary result)   Collection Time: 04/22/15  3:14 PM  Result Value Ref Range Status   Specimen Description BRONCHIAL ALVEOLAR LAVAGE  Final   Special Requests Normal  Final   Culture NO FUNGUS  ISOLATED AFTER 3 DAYS  Final   Report Status PENDING  Incomplete  Culture, bal-quantitative     Status: None (Preliminary result)   Collection Time: 04/22/15  3:14 PM  Result  Value Ref Range Status   Specimen Description BRONCHIAL ALVEOLAR LAVAGE  Final   Special Requests Normal  Final   Gram Stain   Final    MANY WBC SEEN MANY GRAM NEGATIVE RODS RARE GRAM POSITIVE COCCI IN CLUSTERS EXCELLENT SPECIMEN - 90-100% WBCS    Culture   Final    HEAVY GROWTH PSEUDOMONAS AERUGINOSA HEAVY GROWTH ENTEROBACTER AEROGENES    Report Status PENDING  Incomplete   Organism ID, Bacteria PSEUDOMONAS AERUGINOSA  Final   Organism ID, Bacteria ENTEROBACTER AEROGENES  Final      Susceptibility   Enterobacter aerogenes - MIC*    CEFTAZIDIME Value in next row Sensitive      SENSITIVE4    CEFAZOLIN Value in next row Resistant      RESISTANT>=64    CEFTRIAXONE Value in next row Sensitive      SENSITIVE4    CIPROFLOXACIN Value in next row Sensitive      SENSITIVE<=0.25    GENTAMICIN Value in next row Sensitive      SENSITIVE<=1    IMIPENEM Value in next row Sensitive      SENSITIVE<=1    PIP/TAZO Value in next row Intermediate      INTERMEDIATE32    LEVOFLOXACIN Value in next row Sensitive      SENSITIVE<=0.12    * HEAVY GROWTH ENTEROBACTER AEROGENES   Pseudomonas aeruginosa - MIC*    CEFTAZIDIME Value in next row Sensitive      SENSITIVE4    CIPROFLOXACIN Value in next row Sensitive      SENSITIVE<=0.25    GENTAMICIN Value in next row Sensitive      SENSITIVE<=1    IMIPENEM Value in next row Sensitive      SENSITIVE1    PIP/TAZO Value in next row Sensitive      SENSITIVE8    * HEAVY GROWTH PSEUDOMONAS AERUGINOSA  MRSA PCR Screening     Status: None   Collection Time: 04/22/15  5:23 PM  Result Value Ref Range Status   MRSA by PCR NEGATIVE NEGATIVE Final    Comment:        The GeneXpert MRSA Assay (FDA approved for NASAL specimens only), is one component of a comprehensive MRSA  colonization surveillance program. It is not intended to diagnose MRSA infection nor to guide or monitor treatment for MRSA infections.     No new CXR  IMPRESSION: 1) Acute on chronic respiratory failure 2) COPD without acute bronchospasm 3) HCAP - Klebs and Pseudomonas on resp culture 4) History of laryngeal cancer - chronic trach tube 5) History of lung cancer s/p resection in remote past 6) Bilateral lung masses - I have reviewed CT chest and believe that these are most c/w malignancies. Due to advanced age and poor functional status @ baseline he is a poor candidate for aggressive workup and treatment of presumed lung cancer  PLAN/REC: 1) Complete 10-14 days of abx. All cultured organisms are sensitive to flouroquinolones. Could transition to levofloxacin or ciprofloxacin to complete course enterally 2) If further eval of lung masses is desired, the LLL mass is very accessible to transthoracic needle biopsy 3) strongly encourage Palliative Care input to define goals and limitations of care. He is a very poor candidate for mechanical ventilation and/or ACLS   PCCM will sign off. Please call if we can be of further assistance   Merton Border, MD PCCM service Mobile [phone] Pager [phone]

## 2015-04-25 NOTE — Plan of Care (Signed)
Problem: Discharge Progression Outcomes Goal: Other Discharge Outcomes/Goals Outcome: Progressing VSS. Pt had respiratory distress earlier today, respirations 34, suctioned and neb given by RT with improvement. MD aware. Denies pain.Tolerates feeding via Peg. Soft BM today.

## 2015-04-26 DIAGNOSIS — J151 Pneumonia due to Pseudomonas: Secondary | ICD-10-CM | POA: Diagnosis not present

## 2015-04-26 LAB — GLUCOSE, CAPILLARY
GLUCOSE-CAPILLARY: 200 mg/dL — AB (ref 65–99)
GLUCOSE-CAPILLARY: 239 mg/dL — AB (ref 65–99)
Glucose-Capillary: 208 mg/dL — ABNORMAL HIGH (ref 65–99)
Glucose-Capillary: 120 mg/dL — ABNORMAL HIGH (ref 65–99)

## 2015-04-26 LAB — CULTURE, RESPIRATORY W GRAM STAIN: Special Requests: NORMAL

## 2015-04-26 LAB — CULTURE, BAL-QUANTITATIVE W GRAM STAIN: Special Requests: NORMAL

## 2015-04-26 LAB — CULTURE, BAL-QUANTITATIVE

## 2015-04-26 LAB — CULTURE, RESPIRATORY

## 2015-04-26 MED ORDER — PREDNISOLONE 15 MG/5ML PO SOLN
30.0000 mg | Freq: Every day | ORAL | Status: DC
  Administered 2015-04-27: 30 mg via ORAL
  Filled 2015-04-26 (×2): qty 10

## 2015-04-26 NOTE — Plan of Care (Signed)
Problem: Discharge Progression Outcomes Goal: Dyspnea controlled Outcome: Progressing Pt w/trach - no inner cannula.  Followed by respiratory.  Breathing is improved.  Pt on prednisone taper.   Goal: O2 sats > or equal 90% or at baseline Outcome: Progressing O2 sats > 95% Goal: Independent ADLs or Home Health Care Outcome: Progressing Pt is a standby assist - up to chair.  Will stand to void - very stable.  Goal: Barriers To Progression Addressed/Resolved Outcome: Progressing No barriers noted.  Pt has very supportive wife.  Pt has had PEG tube for 15 yrs.  She also maintains trach.  Goal: Discharge plan in place and appropriate Outcome: Progressing Pt possible d/c home tomorrow w/wife.  Goal: Activity appropriate for discharge plan Outcome: Progressing Pt was up to chair most of the day.   Goal: Hemodynamically stable Outcome: Progressing  VSS Goal: Complications resolved/controlled Outcome: Progressing No complications noted.  Pt has no c/o pain. Breathing easier.

## 2015-04-26 NOTE — Progress Notes (Signed)
Patient ID: Stephen Taylor, male   DOB: 11-15-1931, 81 y.o.   MRN: [id number] [facility] PROGRESS NOTE  PCP: Lavera Guise, MD  HPI/Subjective: Patient interested in going home tomorrow. He is breathing better. Some shortness of breath and some cough.  Objective: Filed Vitals:   04/25/15 2110  BP: 125/60  Pulse: 92  Temp: 97.6 F (36.4 C)  Resp: 20    Filed Weights   04/21/15 1734  Weight: 78.835 kg (173 lb 12.8 oz)    ROS: Review of Systems  Constitutional: Negative for fever and chills.  Eyes: Negative for blurred vision.  Respiratory: Positive for cough and shortness of breath.   Cardiovascular: Negative for chest pain.  Gastrointestinal: Negative for nausea, vomiting, abdominal pain, diarrhea and constipation.  Genitourinary: Negative for dysuria.  Musculoskeletal: Negative for joint pain.  Neurological: Negative for dizziness and headaches.   Exam: Physical Exam  Constitutional: He is oriented to person, place, and time.  HENT:  Nose: No mucosal edema.  Mouth/Throat: No oropharyngeal exudate or posterior oropharyngeal edema.  Eyes: Conjunctivae, EOM and lids are normal. Pupils are equal, round, and reactive to light.  Neck: No JVD present. Carotid bruit is not present. No edema present. No thyroid mass and no thyromegaly present.  Cardiovascular: S1 normal and S2 normal.  Exam reveals no gallop.   Murmur heard.  Systolic murmur is present with a grade of 3/6  Pulses:      Dorsalis pedis pulses are 2+ on the right side, and 2+ on the left side.  Respiratory: No respiratory distress. He has no wheezes. He has rhonchi in the right lower field and the left lower field. He has no rales.  GI: Soft. Bowel sounds are normal. There is no tenderness.  Musculoskeletal:       Right ankle: He exhibits no swelling.       Left ankle: He exhibits no swelling.  Lymphadenopathy:    He has no cervical adenopathy.  Neurological: He is alert and oriented to  person, place, and time. No cranial nerve deficit.  Skin: Skin is warm. No rash noted. Nails show no clubbing.  Psychiatric: He has a normal mood and affect.    Data Reviewed: Basic Metabolic Panel:  Recent Labs Lab 04/21/15 1742 04/22/15 0556 04/23/15 0516 04/24/15 0537 04/25/15 0555  NA 133* 136 140 142 144  K 4.7 4.5 4.9 5.0 4.7  CL 93* 94* 96* 99* 98*  CO2 31 36* 35* 36* 41*  GLUCOSE 183* 225* 371* 221* 198*  BUN 24* 20 30* 34* 33*  CREATININE 0.68 0.69 0.91 0.65 0.60*  CALCIUM 8.6* 8.9 9.5 9.4 9.1   CBC:  Recent Labs Lab 04/21/15 1742 04/22/15 0556 04/23/15 0516 04/24/15 0537 04/25/15 0555  WBC 13.9* 12.0* 32.7* 26.8* 18.5*  NEUTROABS 11.1*  --   --   --   --   HGB 8.2* 8.7* 8.8* 8.1* 8.3*  HCT 27.9* 28.8* 30.2* 27.9* 28.0*  MCV 69.2* 69.6* 70.1* 69.8* 69.9*  PLT 240 232 355 295 296   Cardiac Enzymes:  Recent Labs Lab 04/21/15 1742  TROPONINI <0.03   BNP (last 3 results)  Recent Labs  11/06/14 1231 04/06/15 1558  BNP 128.0* 286.0*    CBG:  Recent Labs Lab 04/25/15 1146 04/25/15 1718 04/25/15 2132 04/26/15 0758 04/26/15 1156  GLUCAP 182* 179* 272* 208* 120*    Recent Results (from the past 240 hour(s))  Culture, expectorated sputum-assessment     Status: None  Collection Time: 04/22/15 12:40 AM  Result Value Ref Range Status   Specimen Description SPUTUM  Final   Special Requests Normal  Final   Sputum evaluation THIS SPECIMEN IS ACCEPTABLE FOR SPUTUM CULTURE  Final   Report Status 04/23/2015 FINAL  Final  Culture, respiratory (NON-Expectorated)     Status: None   Collection Time: 04/22/15 12:40 AM  Result Value Ref Range Status   Specimen Description SPUTUM  Final   Special Requests Normal Reflexed from [id number]  Final   Gram Stain   Final    EXCELLENT SPECIMEN - 90-100% WBCS MANY WBC SEEN FEW GRAM POSITIVE COCCI IN CHAINS IN CLUSTERS    Culture   Final    HEAVY GROWTH PSEUDOMONAS AERUGINOSA HEAVY GROWTH KLEBSIELLA  PNEUMONIAE HEAVY GROWTH CITROBACTER FREUNDII    Report Status 04/26/2015 FINAL  Final   Organism ID, Bacteria PSEUDOMONAS AERUGINOSA  Final   Organism ID, Bacteria KLEBSIELLA PNEUMONIAE  Final   Organism ID, Bacteria CITROBACTER FREUNDII  Final      Susceptibility   Citrobacter freundii - MIC*    CEFAZOLIN >=64 RESISTANT Resistant     CEFTRIAXONE <=1 SENSITIVE Sensitive     CIPROFLOXACIN 1 SENSITIVE Sensitive     GENTAMICIN <=1 SENSITIVE Sensitive     IMIPENEM <=0.25 SENSITIVE Sensitive     NITROFURANTOIN <=16 SENSITIVE Sensitive     TRIMETH/SULFA >=320 RESISTANT Resistant     PIP/TAZO Value in next row Sensitive      SENSITIVE<=4    LEVOFLOXACIN Value in next row Sensitive      SENSITIVE1    * HEAVY GROWTH CITROBACTER FREUNDII   Klebsiella pneumoniae - MIC*    AMPICILLIN Value in next row Resistant      SENSITIVE1    CEFAZOLIN Value in next row Sensitive      SENSITIVE1    CEFTRIAXONE Value in next row Sensitive      SENSITIVE1    CIPROFLOXACIN Value in next row Sensitive      SENSITIVE1    GENTAMICIN Value in next row Sensitive      SENSITIVE1    IMIPENEM Value in next row Sensitive      SENSITIVE1    NITROFURANTOIN Value in next row Intermediate      SENSITIVE1    TRIMETH/SULFA Value in next row Resistant      SENSITIVE1    PIP/TAZO Value in next row Sensitive      SENSITIVE<=4    LEVOFLOXACIN Value in next row Sensitive      SENSITIVE<=0.12    AMPICILLIN/SULBACTAM Value in next row Intermediate      INTERMEDIATE16    CEFTAZIDIME Value in next row Sensitive      SENSITIVE<=4    * HEAVY GROWTH KLEBSIELLA PNEUMONIAE   Pseudomonas aeruginosa - MIC*    CEFTAZIDIME Value in next row Sensitive      SENSITIVE<=4    CIPROFLOXACIN Value in next row Sensitive      SENSITIVE<=4    GENTAMICIN Value in next row Sensitive      SENSITIVE<=4    IMIPENEM Value in next row Sensitive      SENSITIVE<=4    PIP/TAZO Value in next row Sensitive      SENSITIVE<=4    * HEAVY  GROWTH PSEUDOMONAS AERUGINOSA  Fungus Culture with Smear     Status: None (Preliminary result)   Collection Time: 04/22/15  3:14 PM  Result Value Ref Range Status   Specimen Description BRONCHIAL ALVEOLAR LAVAGE  Final   Special  Requests Normal  Final   Culture NO FUNGUS ISOLATED IN 4 DAYS  Final   Report Status PENDING  Incomplete  Culture, bal-quantitative     Status: None   Collection Time: 04/22/15  3:14 PM  Result Value Ref Range Status   Specimen Description BRONCHIAL ALVEOLAR LAVAGE  Final   Special Requests Normal  Final   Gram Stain   Final    MANY WBC SEEN MANY GRAM NEGATIVE RODS RARE GRAM POSITIVE COCCI IN CLUSTERS EXCELLENT SPECIMEN - 90-100% WBCS    Culture   Final    HEAVY GROWTH PSEUDOMONAS AERUGINOSA HEAVY GROWTH ENTEROBACTER AEROGENES    Report Status 04/26/2015 FINAL  Final   Organism ID, Bacteria PSEUDOMONAS AERUGINOSA  Final   Organism ID, Bacteria ENTEROBACTER AEROGENES  Final      Susceptibility   Enterobacter aerogenes - MIC*    CEFTAZIDIME Value in next row Sensitive      SENSITIVE4    CEFAZOLIN Value in next row Resistant      RESISTANT>=64    CEFTRIAXONE Value in next row Sensitive      SENSITIVE4    CIPROFLOXACIN Value in next row Sensitive      SENSITIVE<=0.25    GENTAMICIN Value in next row Sensitive      SENSITIVE<=1    IMIPENEM Value in next row Sensitive      SENSITIVE<=1    PIP/TAZO Value in next row Intermediate      INTERMEDIATE32    LEVOFLOXACIN Value in next row Sensitive      SENSITIVE<=0.12    * HEAVY GROWTH ENTEROBACTER AEROGENES   Pseudomonas aeruginosa - MIC*    CEFTAZIDIME Value in next row Sensitive      SENSITIVE4    CIPROFLOXACIN Value in next row Sensitive      SENSITIVE<=0.25    GENTAMICIN Value in next row Sensitive      SENSITIVE<=1    IMIPENEM Value in next row Sensitive      SENSITIVE1    PIP/TAZO Value in next row Sensitive      SENSITIVE8    * HEAVY GROWTH PSEUDOMONAS AERUGINOSA  MRSA PCR Screening      Status: None   Collection Time: 04/22/15  5:23 PM  Result Value Ref Range Status   MRSA by PCR NEGATIVE NEGATIVE Final    Comment:        The GeneXpert MRSA Assay (FDA approved for NASAL specimens only), is one component of a comprehensive MRSA colonization surveillance program. It is not intended to diagnose MRSA infection nor to guide or monitor treatment for MRSA infections.      Studies: No results found.  Scheduled Meds: . acetylcysteine  4 mL Nebulization BID  . antiseptic oral rinse  7 mL Mouth Rinse q12n4p  . aspirin EC  81 mg Oral Daily  . cefTAZidime (FORTAZ)  IV  2 g Intravenous 3 times per day  . chlorhexidine  15 mL Mouth Rinse BID  . citalopram  20 mg Per Tube Daily  . enoxaparin (LOVENOX) injection  40 mg Subcutaneous Q24H  . ferrous sulfate  325 mg Oral Daily  . folic acid  1 mg Per Tube Daily  . free water  250 mL Per Tube 3 times per day  . insulin aspart  0-9 Units Subcutaneous TID WC  . ipratropium-albuterol  3 mL Nebulization QID  . itraconazole  100 mg Oral Daily  . loratadine  10 mg Per Tube Daily  . methylPREDNISolone (SOLU-MEDROL) injection  40 mg  Intravenous Daily  . multivitamin with minerals  1 tablet Oral Daily  . pantoprazole sodium  40 mg Per Tube BID  . sodium chloride  3 mL Intravenous Q12H  . tamsulosin  0.4 mg Oral Daily   Continuous Infusions: . feeding supplement (JEVITY 1.5 CAL/FIBER) 1,000 mL (04/25/15 1722)    Assessment/Plan:  1. Acute on chronic respiratory failure. Continue trach collar. Bronchoscopy on 10/26. 2. Healthcare associated pneumonia- patient was on aggressive antibiotics. Patient switched Over to ceftazidime to cover Pseudomonas and Klebsiella. Will need prolonged course of abx. I will consult with infectious disease doctor on length of therapy and IV versus oral on Monday 3. Lung masses seen on CT scan- case discussed with Dr. Alva Garnet yesterday afternoon. He is concerned that this is a cancerous process. I spoke  with the patient and wife about this. The patient is interested in going home and doing antibiotic therapy first. Patient deferred a biopsy at this time. Patient will end up needing a CT scan after course of antibiotics is finished for further evaluation of these lung masses. 4. COPD exacerbation- switched to prednisone taper 5. Fungal infection lungs- on itaconazole 6. slepp apnea 7. GERD- protonix 8. Depression- continue celexa   Code Status:     Code Status Orders        Start     Ordered   04/21/15 2145  Full code   Continuous     04/21/15 2144    Advance Directive Documentation        Most Recent Value   Type of Advance Directive  Healthcare Power of Attorney   Pre-existing out of facility DNR order (yellow form or pink MOST form)     "MOST" Form in Place?       Family Communication: wife at bedside Disposition Plan: home with home health - likely Monday  Consultants:  Pulmonary  Infectious disease  Antibiotics:  Ceftazidime  Itraconazole  Time spent: 52minutes  Loletha Grayer  [facility]

## 2015-04-26 NOTE — Plan of Care (Signed)
Problem: Discharge Progression Outcomes Goal: Dyspnea controlled Outcome: Progressing Individualization of Care Pt prefers to be called Stephen Taylor Hx of laryngeal and prostate cancer, COPD, CAD, stroke, nephrolithiasis, depression, MRSA pneumonia, BPH, anemia, osteoarthritis   Goal: Other Discharge Outcomes/Goals Pain: Patient without complaints of pain this shift.  O2 sats: Patient remains on 10L O2. Nebulizer treatments Q4 hours. Patient suctioned by RN twice this shift.  Tolerated well. Hemodynamically: Patient afebrile and VSS this shift. IV Fortaz given x 2 per eMAR. BP 125/60 mmHg  Pulse 92  Temp(Src) 97.6 F (36.4 C) (Oral)  Resp 20  Ht 5\' 11"  (1.803 m)  Wt 173 lb 12.8 oz (78.835 kg)  BMI 24.25 kg/m2  SpO2 97% Activity: Patient remained in bed and resting this shift.  Complications: No complications this shift.  Diet: Medications crushed through PEG tube. Patient on Jevity 1.5 at 95 mL/hr throughout shift with 250 mL free water Q8.

## 2015-04-27 ENCOUNTER — Inpatient Hospital Stay: Payer: Medicare Other

## 2015-04-27 LAB — GLUCOSE, CAPILLARY
GLUCOSE-CAPILLARY: 180 mg/dL — AB (ref 65–99)
GLUCOSE-CAPILLARY: 151 mg/dL — AB (ref 65–99)

## 2015-04-27 MED ORDER — PREDNISONE 5 MG PO TABS: ORAL_TABLET | ORAL | Status: DC

## 2015-04-27 MED ORDER — FREE WATER: 250.0000 mL | Freq: Three times a day (TID) | Status: AC

## 2015-04-27 MED ORDER — CIPROFLOXACIN HCL 750 MG PO TABS: 750.0000 mg | ORAL_TABLET | Freq: Two times a day (BID) | ORAL | Status: DC

## 2015-04-27 MED ORDER — FLUCONAZOLE 100 MG PO TABS: 100.0000 mg | ORAL_TABLET | Freq: Every day | ORAL | Status: AC

## 2015-04-27 MED ORDER — IPRATROPIUM-ALBUTEROL 0.5-2.5 (3) MG/3ML IN SOLN: 3.0000 mL | RESPIRATORY_TRACT | Status: DC | PRN

## 2015-04-27 MED ORDER — JEVITY 1.5 CAL/FIBER PO LIQD: 1000.0000 mL | ORAL | Status: AC

## 2015-04-27 MED ORDER — CIPROFLOXACIN HCL 500 MG PO TABS: 750.0000 mg | ORAL_TABLET | Freq: Two times a day (BID) | ORAL | Status: DC

## 2015-04-27 NOTE — Progress Notes (Signed)
Nutrition Follow-up  DOCUMENTATION CODES:   Severe malnutrition in context of chronic illness  INTERVENTION:   EN: Spoke with Jody, Pharmacist this am as well as Stanton Kidney, Pharmacist regarding antibiotic to be given at discharge via PEG BID (preferrably breakfast and dinner close to 12 hours apart) and pt receiving tube feeding. Per pharmacy, Cipro bonds with tube feeding and recommend holding TF for 1 hours before and 2 hours after medication. Per wife, pt has plenty of Nutren 2.0 at home, which is his usual TF regimen. Pt normally takes a total 4 cartons at night at 12mL/hr for 10 hours. With Nutren TF regimen if 10 hours at home, pt will be able to finish TF in the morning, wait 1 hour prior to medication and wait 2 hours before starting TF at night. RD aided wife by writing recommendations down for discharge today and reporting discharge summary will also be provided. Stanton Kidney, RN aware. Will continue to provide assistance as needed.   NUTRITION DIAGNOSIS:   Unintentional weight loss related to chronic illness as evidenced by percent weight loss; being addressed with TF  GOAL:   Patient will meet greater than or equal to 90% of their needs; ongoing  MONITOR:    (EN, Digestive System, Electrolyte/Renal Profile, Glucose Profile, Anthropometrics)  REASON FOR ASSESSMENT:   Consult Enteral/tube feeding initiation and management  ASSESSMENT:    Pt to be discharged today per MD note.  Diet Order:  Diet NPO time specified    Current Nutrition: Pt tolerating Jevity 1.5 16 hour feeds very well currently.   Gastrointestinal Profile: Last BM: 04/26/2015   Scheduled Medications:  . antiseptic oral rinse  7 mL Mouth Rinse q12n4p  . aspirin EC  81 mg Oral Daily  . chlorhexidine  15 mL Mouth Rinse BID  . ciprofloxacin  750 mg Per Tube BID  . citalopram  20 mg Per Tube Daily  . enoxaparin (LOVENOX) injection  40 mg Subcutaneous Q24H  . ferrous sulfate  325 mg Oral Daily  . folic acid  1  mg Per Tube Daily  . free water  250 mL Per Tube 3 times per day  . insulin aspart  0-9 Units Subcutaneous TID WC  . itraconazole  100 mg Oral Daily  . loratadine  10 mg Per Tube Daily  . multivitamin with minerals  1 tablet Oral Daily  . pantoprazole sodium  40 mg Per Tube BID  . prednisoLONE  30 mg Oral QAC breakfast  . sodium chloride  3 mL Intravenous Q12H  . tamsulosin  0.4 mg Oral Daily    Continuous Medications:  . feeding supplement (JEVITY 1.5 CAL/FIBER) 1,000 mL (04/25/15 1722)     Electrolyte/Renal Profile and Glucose Profile:   Recent Labs Lab 04/23/15 0516 04/24/15 0537 04/25/15 0555  NA 140 142 144  K 4.9 5.0 4.7  CL 96* 99* 98*  CO2 35* 36* 41*  BUN 30* 34* 33*  CREATININE 0.91 0.65 0.60*  CALCIUM 9.5 9.4 9.1  GLUCOSE 371* 221* 198*   Protein Profile: No results for input(s): ALBUMIN in the last 168 hours.   Weight Trend since Admission: Filed Weights   04/21/15 1734  Weight: 173 lb 12.8 oz (78.835 kg)    BMI:  Body mass index is 24.25 kg/(m^2).    Estimated Nutritional Needs:   Kcal:  2177-2540 kcals (BEE 1512, 1.2 AF, 1.2-1.4 IF)   Protein:  87-103 g (1.1-1.3 g/kg)   Fluid:  1975-2370 mL (25-30 ml/kg)  EDUCATION NEEDS:   No education needs identified at this time   Faylinn Schwenn [place], [place], LDN Pager [phone]

## 2015-04-27 NOTE — Plan of Care (Signed)
Problem: Discharge Progression Outcomes Goal: Other Discharge Outcomes/Goals Outcome: Progressing Pt w/respiratory failure and hospital acquired PNA - bilateral w/pseudomonas has improved back to baseline and is d/ced home.  He's been on IV abx and will go home on Cipro and has + funcal culture and will be on fluconazole.  Pt w/f/u outpt w/Saad[facility] Humphrey Rolls for lung masses.  Will also be on steroid taper.  PICC line removed.  Pt's wife very supportive and capable to handle trach and PEG tube. Dietician met w/wife to give her instructions to stop feeds around administration of medications.

## 2015-04-27 NOTE — Care Management Important Message (Signed)
Important Message  Patient Details  Name: Stephen Taylor MRN: [id number] Date of Birth: [birth year]   Medicare Important Message Given:  Yes-third notification given    Shelbie Ammons, RN 04/27/2015, 10:59 AM

## 2015-04-27 NOTE — Discharge Summary (Signed)
Stephen Taylor at [place] NAME: Stephen Taylor    MR#:  [id number]  DATE OF BIRTH:  Oct 02, 1931  DATE OF ADMISSION:  04/21/2015 ADMITTING PHYSICIAN: Stephen Lighter, MD  DATE OF DISCHARGE: 04/27/2015  PRIMARY CARE PHYSICIAN: Stephen Guise, MD    ADMISSION DIAGNOSIS:  Respiratory failure ([place]) [J96.90] Hospital acquired PNA [J18.9]  DISCHARGE DIAGNOSIS:  Active Problems:   Acute respiratory failure (HCC)   Protein-calorie malnutrition, severe   Hospital acquired PNA   SECONDARY DIAGNOSIS:   Past Medical History  Diagnosis Date  . Cancer (HCC)     laryngeal cancer, prostrate cancer  . COPD (chronic obstructive pulmonary disease) ([place])   . Coronary artery disease   . Stroke [facility])     no residual weakness  . Depression   . Sleep apnea   . MRSA pneumonia ([place])   . Barrett's esophagus   . BPH (benign prostatic hyperplasia)   . Nephrolithiasis   . Osteoarthritis   . Anemia     HOSPITAL COURSE:  1. Acute on chronic respiratory failure with hypoxia. Patient admitted to the hospital with respiratory distress. No back to his usual trach collar with oxygen. 2. Pneumonia bilaterally with Pseudomonas. Patient treated for a pneumonia with aggressive antibiotics. Switched over to ceftazidime. Patient will be switched over to PEG tube Cipro for a total of 21 day course. Because of fungal culture positive also was on antifungal's. Fluconazole for another 5 days. Spoke with Dr. Ola Taylor and confirmed antibiotics. 3. Lung masses seen on CT scan- high suspicion for cancerous process. Follow-up with Dr. Devona Taylor as outpatient. Consider a CT scan again after treatment course area. 4. COPD exacerbation- tapering dose of steroids will be done upon discharge. Was on IV Solu-Medrol hospital. 5. Depression- on Celexa 6.. Malnutrition- continue tube feeds 7. History of coronary artery disease and stroke 8. Anemia of chronic disease- patient  takes iron  DISCHARGE CONDITIONS:   Satisfactory  CONSULTS OBTAINED:  Treatment Team:  Stephen Prows, MD  DRUG ALLERGIES:   Allergies  Allergen Reactions  . Morphine And Related Other (See Comments)    Reaction:  Altered mental status    DISCHARGE MEDICATIONS:   Current Discharge Medication List    START taking these medications   Details  ciprofloxacin (CIPRO) 750 MG tablet Place 1 tablet (750 mg total) into feeding tube 2 (two) times daily. Qty: 32 tablet, Refills: 0    fluconazole (DIFLUCAN) 100 MG tablet Place 1 tablet (100 mg total) into feeding tube daily. Qty: 5 tablet, Refills: 0    Nutritional Supplements (FEEDING SUPPLEMENT, JEVITY 1.5 CAL/FIBER,) LIQD Place 1,000 mLs into feeding tube continuous.    Water For Irrigation, Sterile (FREE WATER) SOLN Place 250 mLs into feeding tube every 8 (eight) hours.      CONTINUE these medications which have CHANGED   Details  predniSONE (DELTASONE) 5 MG tablet 4 tabs peg day1; 3 tabs day2; 2 tabs day3; 1 tab day4,5 Qty: 11 tablet, Refills: 0      CONTINUE these medications which have NOT CHANGED   Details  aspirin EC 81 MG tablet 81 mg by PEG Tube route daily.     citalopram (CELEXA) 20 MG tablet 20 mg by PEG Tube route daily.     EPINEPHrine (EPIPEN 2-PAK) 0.3 mg/0.3 mL IJ SOAJ injection Inject 0.3 mg into the muscle once as needed (for severe allergic reaction).    Fe Fum-FA-B Cmp-C-Zn-Mg-Mn-Cu (CENTRATEX) 106-1 MG CAPS 1 capsule by  PEG Tube route daily.     ferrous sulfate 325 (65 FE) MG EC tablet 325 mg by PEG Tube route daily.     folic acid (FOLVITE) 1 MG tablet 1 mg by PEG Tube route daily.     guaiFENesin (MUCINEX) 600 MG 12 hr tablet Take 600 mg by mouth 2 (two) times daily.    guaifenesin (ROBITUSSIN) 100 MG/5ML syrup Take 400 mg by mouth every 4 (four) hours as needed for cough.    HYDROcodone-acetaminophen (NORCO/VICODIN) 5-325 MG per tablet 1 tablet by PEG Tube route 3 (three) times daily as  needed for moderate pain.     ipratropium-albuterol (DUONEB) 0.5-2.5 (3) MG/3ML SOLN Take 3 mLs by nebulization every 6 (six) hours as needed (for shortness of breath).    loratadine (CLARITIN) 10 MG tablet 10 mg by PEG Tube route daily.     LORazepam (ATIVAN) 0.5 MG tablet 0.5 mg by PEG Tube route 4 (four) times daily as needed for anxiety.     montelukast (SINGULAIR) 10 MG tablet 10 mg by PEG Tube route at bedtime.     Multiple Vitamin (MULTIVITAMIN WITH MINERALS) TABS tablet 1 tablet by PEG Tube route daily.     pantoprazole (PROTONIX) 40 MG tablet 40 mg by PEG Tube route 2 (two) times daily.     tamsulosin (FLOMAX) 0.4 MG CAPS capsule 0.4 mg by PEG Tube route daily.     zolpidem (AMBIEN) 5 MG tablet 5 mg by PEG Tube route at bedtime as needed for sleep.       STOP taking these medications     sulfamethoxazole-trimethoprim (BACTRIM DS,SEPTRA DS) 800-160 MG tablet      amoxicillin-clavulanate (AUGMENTIN) 500-125 MG tablet      furosemide (LASIX) 20 MG tablet          DISCHARGE INSTRUCTIONS:    Follow-up with Dr. Devona Taylor 3 weeks Follow-up with Stephen Taylor in one week.  If you experience worsening of your admission symptoms, develop shortness of breath, life threatening emergency, suicidal or homicidal thoughts you must seek medical attention immediately by calling 911 or calling your MD immediately  if symptoms less severe.  You Must read complete instructions/literature along with all the possible adverse reactions/side effects for all the Medicines you take and that have been prescribed to you. Take any new Medicines after you have completely understood and accept all the possible adverse reactions/side effects.   Please note  You were cared for by a hospitalist during your hospital stay. If you have any questions about your discharge medications or the care you received while you were in the hospital after you are discharged, you can call the unit and asked to speak  with the hospitalist on call if the hospitalist that took care of you is not available. Once you are discharged, your primary care physician will handle any further medical issues. Please note that NO REFILLS for any discharge medications will be authorized once you are discharged, as it is imperative that you return to your primary care physician (or establish a relationship with a primary care physician if you do not have one) for your aftercare needs so that they can reassess your need for medications and monitor your lab values.    Today   CHIEF COMPLAINT:   Chief Complaint  Patient presents with  . Shortness of Breath    HISTORY OF PRESENT ILLNESS:  Stephen Taylor  is a 81 y.o. male with a presented with shortness of breath and found  to have a pneumonia   VITAL SIGNS:  Blood pressure 125/45, pulse 88, temperature 97.5 F (36.4 C), temperature source Oral, resp. rate 20, height 5\' 11"  (1.803 m), weight 78.835 kg (173 lb 12.8 oz), SpO2 100 %.    PHYSICAL EXAMINATION:  GENERAL:  81 y.o.-year-old patient lying in the bed with no acute distress.  EYES: Pupils equal, round, reactive to light and accommodation. No scleral icterus. Extraocular muscles intact.  HEENT: Head atraumatic, normocephalic. Oropharynx and nasopharynx clear.  NECK:  Supple, no jugular venous distention. No thyroid enlargement, no tenderness.  LUNGS: Normal breath sounds bilaterally, no wheezing or rales, positive for rhonchi bilateral bases. No use of accessory muscles of respiration.  CARDIOVASCULAR: S1, S2 normal. No murmurs, rubs, or gallops.  ABDOMEN: Soft, non-tender, non-distended. Bowel sounds present. No organomegaly or mass.  EXTREMITIES: No pedal edema, cyanosis, or clubbing.  NEUROLOGIC: Cranial nerves II through XII are intact. Muscle strength 5/5 in all extremities. Sensation intact. Gait not checked.  PSYCHIATRIC: The patient is alert and oriented x 3.  SKIN: No obvious rash, lesion, or ulcer.    DATA REVIEW:   CBC  Recent Labs Lab 04/25/15 0555  WBC 18.5*  HGB 8.3*  HCT 28.0*  PLT 296    Chemistries   Recent Labs Lab 04/25/15 0555  NA 144  K 4.7  CL 98*  CO2 41*  GLUCOSE 198*  BUN 33*  CREATININE 0.60*  CALCIUM 9.1    Cardiac Enzymes  Recent Labs Lab 04/21/15 1742  TROPONINI <0.03    Microbiology Results  Results for orders placed or performed during the hospital encounter of 04/21/15  Culture, expectorated sputum-assessment     Status: None   Collection Time: 04/22/15 12:40 AM  Result Value Ref Range Status   Specimen Description SPUTUM  Final   Special Requests Normal  Final   Sputum evaluation THIS SPECIMEN IS ACCEPTABLE FOR SPUTUM CULTURE  Final   Report Status 04/23/2015 FINAL  Final  Culture, respiratory (NON-Expectorated)     Status: None   Collection Time: 04/22/15 12:40 AM  Result Value Ref Range Status   Specimen Description SPUTUM  Final   Special Requests Normal Reflexed from [id number]  Final   Gram Stain   Final    EXCELLENT SPECIMEN - 90-100% WBCS MANY WBC SEEN FEW GRAM POSITIVE COCCI IN CHAINS IN CLUSTERS    Culture   Final    HEAVY GROWTH PSEUDOMONAS AERUGINOSA HEAVY GROWTH KLEBSIELLA PNEUMONIAE HEAVY GROWTH CITROBACTER FREUNDII    Report Status 04/26/2015 FINAL  Final   Organism ID, Bacteria PSEUDOMONAS AERUGINOSA  Final   Organism ID, Bacteria KLEBSIELLA PNEUMONIAE  Final   Organism ID, Bacteria CITROBACTER FREUNDII  Final      Susceptibility   Citrobacter freundii - MIC*    CEFAZOLIN >=64 RESISTANT Resistant     CEFTRIAXONE <=1 SENSITIVE Sensitive     CIPROFLOXACIN 1 SENSITIVE Sensitive     GENTAMICIN <=1 SENSITIVE Sensitive     IMIPENEM <=0.25 SENSITIVE Sensitive     NITROFURANTOIN <=16 SENSITIVE Sensitive     TRIMETH/SULFA >=320 RESISTANT Resistant     PIP/TAZO Value in next row Sensitive      SENSITIVE<=4    LEVOFLOXACIN Value in next row Sensitive      SENSITIVE1    * HEAVY GROWTH CITROBACTER FREUNDII    Klebsiella pneumoniae - MIC*    AMPICILLIN Value in next row Resistant      SENSITIVE1    CEFAZOLIN Value in next row Sensitive  SENSITIVE1    CEFTRIAXONE Value in next row Sensitive      SENSITIVE1    CIPROFLOXACIN Value in next row Sensitive      SENSITIVE1    GENTAMICIN Value in next row Sensitive      SENSITIVE1    IMIPENEM Value in next row Sensitive      SENSITIVE1    NITROFURANTOIN Value in next row Intermediate      SENSITIVE1    TRIMETH/SULFA Value in next row Resistant      SENSITIVE1    PIP/TAZO Value in next row Sensitive      SENSITIVE<=4    LEVOFLOXACIN Value in next row Sensitive      SENSITIVE<=0.12    AMPICILLIN/SULBACTAM Value in next row Intermediate      INTERMEDIATE16    CEFTAZIDIME Value in next row Sensitive      SENSITIVE<=4    * HEAVY GROWTH KLEBSIELLA PNEUMONIAE   Pseudomonas aeruginosa - MIC*    CEFTAZIDIME Value in next row Sensitive      SENSITIVE<=4    CIPROFLOXACIN Value in next row Sensitive      SENSITIVE<=4    GENTAMICIN Value in next row Sensitive      SENSITIVE<=4    IMIPENEM Value in next row Sensitive      SENSITIVE<=4    PIP/TAZO Value in next row Sensitive      SENSITIVE<=4    * HEAVY GROWTH PSEUDOMONAS AERUGINOSA  Fungus Culture with Smear     Status: None (Preliminary result)   Collection Time: 04/22/15  3:14 PM  Result Value Ref Range Status   Specimen Description BRONCHIAL ALVEOLAR LAVAGE  Final   Special Requests Normal  Final   Culture NO FUNGUS ISOLATED IN 4 DAYS  Final   Report Status PENDING  Incomplete  Culture, bal-quantitative     Status: None   Collection Time: 04/22/15  3:14 PM  Result Value Ref Range Status   Specimen Description BRONCHIAL ALVEOLAR LAVAGE  Final   Special Requests Normal  Final   Gram Stain   Final    MANY WBC SEEN MANY GRAM NEGATIVE RODS RARE GRAM POSITIVE COCCI IN CLUSTERS EXCELLENT SPECIMEN - 90-100% WBCS    Culture   Final    HEAVY GROWTH PSEUDOMONAS AERUGINOSA HEAVY GROWTH  ENTEROBACTER AEROGENES    Report Status 04/26/2015 FINAL  Final   Organism ID, Bacteria PSEUDOMONAS AERUGINOSA  Final   Organism ID, Bacteria ENTEROBACTER AEROGENES  Final      Susceptibility   Enterobacter aerogenes - MIC*    CEFTAZIDIME Value in next row Sensitive      SENSITIVE4    CEFAZOLIN Value in next row Resistant      RESISTANT>=64    CEFTRIAXONE Value in next row Sensitive      SENSITIVE4    CIPROFLOXACIN Value in next row Sensitive      SENSITIVE<=0.25    GENTAMICIN Value in next row Sensitive      SENSITIVE<=1    IMIPENEM Value in next row Sensitive      SENSITIVE<=1    PIP/TAZO Value in next row Intermediate      INTERMEDIATE32    LEVOFLOXACIN Value in next row Sensitive      SENSITIVE<=0.12    * HEAVY GROWTH ENTEROBACTER AEROGENES   Pseudomonas aeruginosa - MIC*    CEFTAZIDIME Value in next row Sensitive      SENSITIVE4    CIPROFLOXACIN Value in next row Sensitive      SENSITIVE<=0.25  GENTAMICIN Value in next row Sensitive      SENSITIVE<=1    IMIPENEM Value in next row Sensitive      SENSITIVE1    PIP/TAZO Value in next row Sensitive      SENSITIVE8    * HEAVY GROWTH PSEUDOMONAS AERUGINOSA  MRSA PCR Screening     Status: None   Collection Time: 04/22/15  5:23 PM  Result Value Ref Range Status   MRSA by PCR NEGATIVE NEGATIVE Final    Comment:        The GeneXpert MRSA Assay (FDA approved for NASAL specimens only), is one component of a comprehensive MRSA colonization surveillance program. It is not intended to diagnose MRSA infection nor to guide or monitor treatment for MRSA infections.     RADIOLOGY:  Dg Chest Port 1 View  04/27/2015  CLINICAL DATA:  Respiratory failure, possible it little acquired pneumonia, history of COPD, previous CVA EXAM: PORTABLE CHEST 1 VIEW COMPARISON:  Portable chest x-ray of April 24, 2015 FINDINGS: The lungs are adequately inflated. There is are stable bibasilar pulmonary masses. The interstitial markings are  less conspicuous today and the central pulmonary vascularity is less engorged. The tracheostomy appliance tip projects 2.2 above the carina. The cardiac silhouette is normal. The pulmonary vascularity is less prominent. The right-sided PICC line is stable. IMPRESSION: Interval mild improvement in the appearance of the pulmonary interstitium. Persistent bibasilar lung masses. Electronically Signed   By: David  Martinique M.D.   On: 04/27/2015 07:18    Management plans discussed with the patient, family and they are in agreement.  CODE STATUS:     Code Status Orders        Start     Ordered   04/21/15 2145  Full code   Continuous     04/21/15 2144    Advance Directive Documentation        Most Recent Value   Type of Advance Directive  Healthcare Power of Attorney   Pre-existing out of facility DNR order (yellow form or pink MOST form)     "MOST" Form in Place?        TOTAL TIME TAKING CARE OF THIS PATIENT: 35 minutes.    Loletha Grayer M.D on 04/27/2015 at 9:24 AM  Between 7am to 6pm - Pager - [phone]  After 6pm go to [email or web address] - password [email or web address]  Office  [phone]  CC: Primary care physician; Stephen Guise, MD

## 2015-04-27 NOTE — Plan of Care (Signed)
Problem: Discharge Progression Outcomes Goal: Dyspnea controlled Outcome: Progressing Pt with trach no inner cannula. Currently on prednisone taper. Goal: O2 sats > or equal 90% or at baseline Outcome: Progressing Oxygen saturations 100 % with oxygen flow at 10 liters/minute Goal: Independent ADLs or Home Health Care Outcome: Progressing Pt up with standby assist. Stands to void steadily. Up to chair for the majority of the night but goes to bed when he is ready.  Goal: Barriers To Progression Addressed/Resolved Outcome: Progressing No barriers to progression noted. Supportive spouse. Pt has had PEG x 15 years.  Goal: Complications resolved/controlled Outcome: Progressing Pt for possible discharge today to home.

## 2015-04-27 NOTE — Care Management (Signed)
Discharge to home today per Dr. Leslye Peer. Resumption of orders per [facility]. [facility] representative updated. Wife will transport home. Shelbie Ammons RN MSN CCM Care management [phone]

## 2015-04-27 NOTE — Progress Notes (Signed)
E note  Can change to cipro 750 bid for 21 days total Can change itraconazole to fluconazole for another 10 days

## 2015-05-05 ENCOUNTER — Emergency Department: Payer: Medicare Other

## 2015-05-05 ENCOUNTER — Encounter: Payer: Self-pay | Admitting: Infectious Diseases

## 2015-05-05 ENCOUNTER — Inpatient Hospital Stay
Admission: EM | Admit: 2015-05-05 | Discharge: 2015-05-08 | DRG: 193 | Disposition: A | Discharge status: Hospice | Payer: Medicare Other | Attending: Internal Medicine | Admitting: Internal Medicine

## 2015-05-05 DIAGNOSIS — Y95 Nosocomial condition: Secondary | ICD-10-CM | POA: Diagnosis present

## 2015-05-05 DIAGNOSIS — K2270 Barrett's esophagus without dysplasia: Secondary | ICD-10-CM | POA: Diagnosis present

## 2015-05-05 DIAGNOSIS — J449 Chronic obstructive pulmonary disease, unspecified: Secondary | ICD-10-CM | POA: Diagnosis present

## 2015-05-05 DIAGNOSIS — D638 Anemia in other chronic diseases classified elsewhere: Secondary | ICD-10-CM | POA: Diagnosis present

## 2015-05-05 DIAGNOSIS — Z7982 Long term (current) use of aspirin: Secondary | ICD-10-CM | POA: Diagnosis not present

## 2015-05-05 DIAGNOSIS — Z885 Allergy status to narcotic agent status: Secondary | ICD-10-CM

## 2015-05-05 DIAGNOSIS — M1990 Unspecified osteoarthritis, unspecified site: Secondary | ICD-10-CM | POA: Diagnosis present

## 2015-05-05 DIAGNOSIS — D649 Anemia, unspecified: Secondary | ICD-10-CM | POA: Diagnosis present

## 2015-05-05 DIAGNOSIS — Z79891 Long term (current) use of opiate analgesic: Secondary | ICD-10-CM | POA: Diagnosis not present

## 2015-05-05 DIAGNOSIS — Z8673 Personal history of transient ischemic attack (TIA), and cerebral infarction without residual deficits: Secondary | ICD-10-CM | POA: Diagnosis not present

## 2015-05-05 DIAGNOSIS — Z931 Gastrostomy status: Secondary | ICD-10-CM | POA: Diagnosis not present

## 2015-05-05 DIAGNOSIS — J189 Pneumonia, unspecified organism: Principal | ICD-10-CM | POA: Diagnosis present

## 2015-05-05 DIAGNOSIS — J9621 Acute and chronic respiratory failure with hypoxia: Secondary | ICD-10-CM | POA: Diagnosis present

## 2015-05-05 DIAGNOSIS — Z66 Do not resuscitate: Secondary | ICD-10-CM | POA: Diagnosis present

## 2015-05-05 DIAGNOSIS — K219 Gastro-esophageal reflux disease without esophagitis: Secondary | ICD-10-CM | POA: Diagnosis present

## 2015-05-05 DIAGNOSIS — F329 Major depressive disorder, single episode, unspecified: Secondary | ICD-10-CM | POA: Diagnosis present

## 2015-05-05 DIAGNOSIS — G4730 Sleep apnea, unspecified: Secondary | ICD-10-CM | POA: Diagnosis present

## 2015-05-05 DIAGNOSIS — Z79899 Other long term (current) drug therapy: Secondary | ICD-10-CM

## 2015-05-05 DIAGNOSIS — E860 Dehydration: Secondary | ICD-10-CM | POA: Diagnosis present

## 2015-05-05 DIAGNOSIS — N400 Enlarged prostate without lower urinary tract symptoms: Secondary | ICD-10-CM | POA: Diagnosis present

## 2015-05-05 DIAGNOSIS — Z87891 Personal history of nicotine dependence: Secondary | ICD-10-CM | POA: Diagnosis not present

## 2015-05-05 DIAGNOSIS — Z515 Encounter for palliative care: Secondary | ICD-10-CM | POA: Diagnosis present

## 2015-05-05 DIAGNOSIS — Z930 Tracheostomy status: Secondary | ICD-10-CM | POA: Diagnosis not present

## 2015-05-05 DIAGNOSIS — Z87442 Personal history of urinary calculi: Secondary | ICD-10-CM

## 2015-05-05 DIAGNOSIS — Z8521 Personal history of malignant neoplasm of larynx: Secondary | ICD-10-CM | POA: Diagnosis not present

## 2015-05-05 DIAGNOSIS — I2510 Atherosclerotic heart disease of native coronary artery without angina pectoris: Secondary | ICD-10-CM | POA: Diagnosis present

## 2015-05-05 DIAGNOSIS — Z8546 Personal history of malignant neoplasm of prostate: Secondary | ICD-10-CM

## 2015-05-05 DIAGNOSIS — J9620 Acute and chronic respiratory failure, unspecified whether with hypoxia or hypercapnia: Secondary | ICD-10-CM | POA: Diagnosis present

## 2015-05-05 LAB — TROPONIN I: TROPONIN I: 0.03 ng/mL (ref <0.031)

## 2015-05-05 LAB — COMPREHENSIVE METABOLIC PANEL
ALK PHOS: 151 U/L — AB (ref 38–126)
ALT: 22 U/L (ref 17–63)
ANION GAP: 3 — AB (ref 5–15)
AST: 19 U/L (ref 15–41)
Albumin: 2 g/dL — ABNORMAL LOW (ref 3.5–5.0)
BILIRUBIN TOTAL: 0.5 mg/dL (ref 0.3–1.2)
BUN: 30 mg/dL — ABNORMAL HIGH (ref 6–20)
CALCIUM: 8.4 mg/dL — AB (ref 8.9–10.3)
CO2: 39 mmol/L — ABNORMAL HIGH (ref 22–32)
Chloride: 100 mmol/L — ABNORMAL LOW (ref 101–111)
Creatinine, Ser: 0.64 mg/dL (ref 0.61–1.24)
Glucose, Bld: 221 mg/dL — ABNORMAL HIGH (ref 65–99)
Potassium: 4.1 mmol/L (ref 3.5–5.1)
Sodium: 142 mmol/L (ref 135–145)
TOTAL PROTEIN: 6.8 g/dL (ref 6.5–8.1)

## 2015-05-05 LAB — CBC
HEMATOCRIT: 27.4 % — AB (ref 40.0–52.0)
HEMOGLOBIN: 8 g/dL — AB (ref 13.0–18.0)
MCH: 20.3 pg — AB (ref 26.0–34.0)
MCHC: 29 g/dL — AB (ref 32.0–36.0)
MCV: 69.8 fL — ABNORMAL LOW (ref 80.0–100.0)
Platelets: 251 10*3/uL (ref 150–440)
RBC: 3.93 MIL/uL — ABNORMAL LOW (ref 4.40–5.90)
RDW: 19.9 % — AB (ref 11.5–14.5)
WBC: 15.7 10*3/uL — ABNORMAL HIGH (ref 3.8–10.6)

## 2015-05-05 MED ORDER — ASPIRIN EC 81 MG PO TBEC
81.0000 mg | DELAYED_RELEASE_TABLET | Freq: Every day | ORAL | Status: DC
  Administered 2015-05-06 – 2015-05-08 (×3): 81 mg via ORAL
  Filled 2015-05-05 (×3): qty 1

## 2015-05-05 MED ORDER — PANTOPRAZOLE SODIUM 40 MG PO PACK
40.0000 mg | PACK | Freq: Two times a day (BID) | ORAL | Status: DC
  Administered 2015-05-06 – 2015-05-08 (×6): 40 mg
  Filled 2015-05-05 (×6): qty 20

## 2015-05-05 MED ORDER — HYDROCODONE-ACETAMINOPHEN 5-325 MG PO TABS: 1.0000 | ORAL_TABLET | Freq: Four times a day (QID) | ORAL | Status: DC | PRN

## 2015-05-05 MED ORDER — GUAIFENESIN ER 600 MG PO TB12: 600.0000 mg | ORAL_TABLET | Freq: Two times a day (BID) | ORAL | Status: DC

## 2015-05-05 MED ORDER — ACETAMINOPHEN 650 MG RE SUPP
650.0000 mg | Freq: Four times a day (QID) | RECTAL | Status: DC | PRN
Start: 2015-05-05 — End: 2015-05-08

## 2015-05-05 MED ORDER — CITALOPRAM HYDROBROMIDE 20 MG PO TABS
20.0000 mg | ORAL_TABLET | Freq: Every day | ORAL | Status: DC
  Administered 2015-05-06 – 2015-05-08 (×3): 20 mg via ORAL
  Filled 2015-05-05 (×3): qty 1

## 2015-05-05 MED ORDER — PIPERACILLIN-TAZOBACTAM 3.375 G IVPB
3.3750 g | Freq: Once | INTRAVENOUS | Status: DC
  Filled 2015-05-05: qty 50

## 2015-05-05 MED ORDER — VANCOMYCIN HCL IN DEXTROSE 1-5 GM/200ML-% IV SOLN
1000.0000 mg | Freq: Two times a day (BID) | INTRAVENOUS | Status: DC
  Administered 2015-05-06 – 2015-05-08 (×5): 1000 mg via INTRAVENOUS
  Filled 2015-05-05 (×6): qty 200

## 2015-05-05 MED ORDER — PANTOPRAZOLE SODIUM 40 MG PO TBEC: 40.0000 mg | DELAYED_RELEASE_TABLET | Freq: Two times a day (BID) | ORAL | Status: DC

## 2015-05-05 MED ORDER — FREE WATER
250.0000 mL | Freq: Three times a day (TID) | Status: DC
  Administered 2015-05-06 – 2015-05-08 (×7): 250 mL

## 2015-05-05 MED ORDER — LORATADINE 10 MG PO TABS
10.0000 mg | ORAL_TABLET | Freq: Every day | ORAL | Status: DC
  Administered 2015-05-06 – 2015-05-08 (×3): 10 mg via ORAL
  Filled 2015-05-05 (×3): qty 1

## 2015-05-05 MED ORDER — MONTELUKAST SODIUM 10 MG PO TABS
10.0000 mg | ORAL_TABLET | Freq: Every day | ORAL | Status: DC
  Administered 2015-05-06 – 2015-05-07 (×3): 10 mg
  Filled 2015-05-05 (×3): qty 1

## 2015-05-05 MED ORDER — ZOLPIDEM TARTRATE 5 MG PO TABS: 5.0000 mg | ORAL_TABLET | Freq: Every evening | ORAL | Status: DC | PRN

## 2015-05-05 MED ORDER — FOLIC ACID 1 MG PO TABS
1.0000 mg | ORAL_TABLET | Freq: Every day | ORAL | Status: DC
  Administered 2015-05-06 – 2015-05-08 (×3): 1 mg via ORAL
  Filled 2015-05-05 (×3): qty 1

## 2015-05-05 MED ORDER — FERROUS SULFATE 325 (65 FE) MG PO TABS
325.0000 mg | ORAL_TABLET | Freq: Every day | ORAL | Status: DC
  Administered 2015-05-06 – 2015-05-08 (×3): 325 mg via ORAL
  Filled 2015-05-05 (×3): qty 1

## 2015-05-05 MED ORDER — LORAZEPAM 0.5 MG PO TABS
0.5000 mg | ORAL_TABLET | Freq: Four times a day (QID) | ORAL | Status: DC | PRN
  Administered 2015-05-07: 0.5 mg
  Filled 2015-05-05: qty 1

## 2015-05-05 MED ORDER — VANCOMYCIN HCL IN DEXTROSE 1-5 GM/200ML-% IV SOLN
1000.0000 mg | Freq: Two times a day (BID) | INTRAVENOUS | Status: DC
  Filled 2015-05-05 (×2): qty 200

## 2015-05-05 MED ORDER — ADULT MULTIVITAMIN W/MINERALS CH
1.0000 | ORAL_TABLET | Freq: Every day | ORAL | Status: DC
  Administered 2015-05-06 – 2015-05-08 (×3): 1 via ORAL
  Filled 2015-05-05 (×3): qty 1

## 2015-05-05 MED ORDER — ONDANSETRON HCL 4 MG/2ML IJ SOLN: 4.0000 mg | Freq: Four times a day (QID) | INTRAMUSCULAR | Status: DC | PRN

## 2015-05-05 MED ORDER — TAMSULOSIN HCL 0.4 MG PO CAPS
0.4000 mg | ORAL_CAPSULE | Freq: Every day | ORAL | Status: DC
Start: 2015-05-06 — End: 2015-05-08
  Administered 2015-05-06 – 2015-05-08 (×3): 0.4 mg via ORAL
  Filled 2015-05-05 (×3): qty 1

## 2015-05-05 MED ORDER — PIPERACILLIN-TAZOBACTAM 4.5 G IVPB
4.5000 g | Freq: Three times a day (TID) | INTRAVENOUS | Status: DC
  Administered 2015-05-06 (×2): 4.5 g via INTRAVENOUS
  Filled 2015-05-05 (×4): qty 100

## 2015-05-05 MED ORDER — ENOXAPARIN SODIUM 40 MG/0.4ML ~~LOC~~ SOLN
40.0000 mg | SUBCUTANEOUS | Status: DC
  Administered 2015-05-06 – 2015-05-07 (×3): 40 mg via SUBCUTANEOUS
  Filled 2015-05-05 (×3): qty 0.4

## 2015-05-05 MED ORDER — JEVITY 1.5 CAL/FIBER PO LIQD
1000.0000 mL | ORAL | Status: DC
  Administered 2015-05-06 – 2015-05-08 (×4): 1000 mL

## 2015-05-05 MED ORDER — IPRATROPIUM-ALBUTEROL 0.5-2.5 (3) MG/3ML IN SOLN
3.0000 mL | Freq: Once | RESPIRATORY_TRACT | Status: AC
  Administered 2015-05-05: 3 mL via RESPIRATORY_TRACT
  Filled 2015-05-05: qty 3

## 2015-05-05 MED ORDER — ONDANSETRON HCL 4 MG PO TABS: 4.0000 mg | ORAL_TABLET | Freq: Four times a day (QID) | ORAL | Status: DC | PRN

## 2015-05-05 MED ORDER — IPRATROPIUM-ALBUTEROL 0.5-2.5 (3) MG/3ML IN SOLN
3.0000 mL | Freq: Four times a day (QID) | RESPIRATORY_TRACT | Status: DC | PRN
  Administered 2015-05-07 (×2): 3 mL via RESPIRATORY_TRACT
  Filled 2015-05-05 (×2): qty 3

## 2015-05-05 MED ORDER — ACETAMINOPHEN 325 MG PO TABS: 650.0000 mg | ORAL_TABLET | Freq: Four times a day (QID) | ORAL | Status: DC | PRN

## 2015-05-05 MED ORDER — VANCOMYCIN HCL IN DEXTROSE 1-5 GM/200ML-% IV SOLN
1000.0000 mg | Freq: Once | INTRAVENOUS | Status: AC
  Administered 2015-05-05: 1000 mg via INTRAVENOUS
  Filled 2015-05-05: qty 200

## 2015-05-05 MED ORDER — LEVOFLOXACIN IN D5W 750 MG/150ML IV SOLN
750.0000 mg | Freq: Once | INTRAVENOUS | Status: AC
  Administered 2015-05-05: 750 mg via INTRAVENOUS
  Filled 2015-05-05: qty 150

## 2015-05-05 NOTE — Progress Notes (Signed)
ANTIBIOTIC CONSULT NOTE - INITIAL  Pharmacy Consult for Zosyn,Vancomycin Indication: pneumonia  Allergies  Allergen Reactions  . Morphine And Related Other (See Comments)    Reaction:  Altered mental status    Patient Measurements: Weight: 166 lb 6.4 oz (75.479 kg) Adjusted Body Weight: 75.3 kg   Vital Signs: Temp: 97.7 F (36.5 C) (11/08 2157) Temp Source: Oral (11/08 2157) BP: 100/70 mmHg (11/08 2157) Pulse Rate: 88 (11/08 2157) Intake/Output from previous day:   Intake/Output from this shift: Total I/O In: -  Out: 25 [Urine:25]  Labs:  Recent Labs  05/05/15 1542  WBC 15.7*  HGB 8.0*  PLT 251  CREATININE 0.64   Estimated Creatinine Clearance: 77.1 mL/min (by C-G formula based on Cr of 0.64). No results for input(s): VANCOTROUGH, VANCOPEAK, VANCORANDOM, GENTTROUGH, GENTPEAK, GENTRANDOM, TOBRATROUGH, TOBRAPEAK, TOBRARND, AMIKACINPEAK, AMIKACINTROU, AMIKACIN in the last 72 hours.   Microbiology: Recent Results (from the past 720 hour(s))  Culture, blood (routine x 2)     Status: None   Collection Time: 04/06/15  4:11 PM  Result Value Ref Range Status   Specimen Description BLOOD RIGHT ANTECUBITAL  Final   Special Requests BOTTLES DRAWN AEROBIC AND ANAEROBIC 10ML  Final   Culture NO GROWTH 5 DAYS  Final   Report Status 04/11/2015 FINAL  Final  Culture, blood (routine x 2)     Status: None   Collection Time: 04/06/15  4:11 PM  Result Value Ref Range Status   Specimen Description BLOOD LEFT  Final   Special Requests BOTTLES DRAWN AEROBIC AND ANAEROBIC 20ML  Final   Culture NO GROWTH 5 DAYS  Final   Report Status 04/11/2015 FINAL  Final  Culture, expectorated sputum-assessment     Status: None   Collection Time: 04/07/15  6:25 AM  Result Value Ref Range Status   Specimen Description SPUTUM  Final   Special Requests Immunocompromised  Final   Sputum evaluation THIS SPECIMEN IS ACCEPTABLE FOR SPUTUM CULTURE  Final   Report Status 04/07/2015 FINAL  Final   Culture, respiratory (NON-Expectorated)     Status: None   Collection Time: 04/07/15  6:25 AM  Result Value Ref Range Status   Specimen Description SPUTUM  Final   Special Requests Immunocompromised Reflexed from [id number]  Final   Gram Stain   Final    MODERATE WBC SEEN MANY YEAST MANY GRAM POSITIVE RODS GOOD SPECIMEN - 80-90% WBCS    Culture   Final    HEAVY GROWTH CANDIDA TROPICALIS MODERATE GROWTH CANDIDA ALBICANS RARE ACINETOBACTER LWOFFII    Report Status 04/12/2015 FINAL  Final   Organism ID, Bacteria ACINETOBACTER LWOFFII  Final      Susceptibility   Acinetobacter lwoffii - MIC*    CEFTRIAXONE Value in next row Intermediate      INTERMEDIATE16    CIPROFLOXACIN Value in next row Resistant      RESISTANT>=4    GENTAMICIN Value in next row Sensitive      SENSITIVE<=1    IMIPENEM Value in next row Sensitive      SENSITIVE<=0.25    PIP/TAZO Value in next row Resistant      RESISTANT>=128    TRIMETH/SULFA Value in next row Sensitive      SENSITIVE<=20    CEFEPIME Value in next row Sensitive      SENSITIVE2    AMPICILLIN/SULBACTAM Value in next row Sensitive      SENSITIVE<=2    * RARE ACINETOBACTER LWOFFII  Culture, expectorated sputum-assessment     Status: None  Collection Time: 04/22/15 12:40 AM  Result Value Ref Range Status   Specimen Description SPUTUM  Final   Special Requests Normal  Final   Sputum evaluation THIS SPECIMEN IS ACCEPTABLE FOR SPUTUM CULTURE  Final   Report Status 04/23/2015 FINAL  Final  Culture, respiratory (NON-Expectorated)     Status: None   Collection Time: 04/22/15 12:40 AM  Result Value Ref Range Status   Specimen Description SPUTUM  Final   Special Requests Normal Reflexed from [id number]  Final   Gram Stain   Final    EXCELLENT SPECIMEN - 90-100% WBCS MANY WBC SEEN FEW GRAM POSITIVE COCCI IN CHAINS IN CLUSTERS    Culture   Final    HEAVY GROWTH PSEUDOMONAS AERUGINOSA HEAVY GROWTH KLEBSIELLA PNEUMONIAE HEAVY GROWTH CITROBACTER  FREUNDII    Report Status 04/26/2015 FINAL  Final   Organism ID, Bacteria PSEUDOMONAS AERUGINOSA  Final   Organism ID, Bacteria KLEBSIELLA PNEUMONIAE  Final   Organism ID, Bacteria CITROBACTER FREUNDII  Final      Susceptibility   Citrobacter freundii - MIC*    CEFAZOLIN >=64 RESISTANT Resistant     CEFTRIAXONE <=1 SENSITIVE Sensitive     CIPROFLOXACIN 1 SENSITIVE Sensitive     GENTAMICIN <=1 SENSITIVE Sensitive     IMIPENEM <=0.25 SENSITIVE Sensitive     NITROFURANTOIN <=16 SENSITIVE Sensitive     TRIMETH/SULFA >=320 RESISTANT Resistant     PIP/TAZO Value in next row Sensitive      SENSITIVE<=4    LEVOFLOXACIN Value in next row Sensitive      SENSITIVE1    * HEAVY GROWTH CITROBACTER FREUNDII   Klebsiella pneumoniae - MIC*    AMPICILLIN Value in next row Resistant      SENSITIVE1    CEFAZOLIN Value in next row Sensitive      SENSITIVE1    CEFTRIAXONE Value in next row Sensitive      SENSITIVE1    CIPROFLOXACIN Value in next row Sensitive      SENSITIVE1    GENTAMICIN Value in next row Sensitive      SENSITIVE1    IMIPENEM Value in next row Sensitive      SENSITIVE1    NITROFURANTOIN Value in next row Intermediate      SENSITIVE1    TRIMETH/SULFA Value in next row Resistant      SENSITIVE1    PIP/TAZO Value in next row Sensitive      SENSITIVE<=4    LEVOFLOXACIN Value in next row Sensitive      SENSITIVE<=0.12    AMPICILLIN/SULBACTAM Value in next row Intermediate      INTERMEDIATE16    CEFTAZIDIME Value in next row Sensitive      SENSITIVE<=4    * HEAVY GROWTH KLEBSIELLA PNEUMONIAE   Pseudomonas aeruginosa - MIC*    CEFTAZIDIME Value in next row Sensitive      SENSITIVE<=4    CIPROFLOXACIN Value in next row Sensitive      SENSITIVE<=4    GENTAMICIN Value in next row Sensitive      SENSITIVE<=4    IMIPENEM Value in next row Sensitive      SENSITIVE<=4    PIP/TAZO Value in next row Sensitive      SENSITIVE<=4    * HEAVY GROWTH PSEUDOMONAS AERUGINOSA  Fungus  Culture with Smear     Status: None (Preliminary result)   Collection Time: 04/22/15  3:14 PM  Result Value Ref Range Status   Specimen Description BRONCHIAL ALVEOLAR LAVAGE  Final   Special  Requests Normal  Final   Culture NO FUNGUS ISOLATED AFTER 13 DAYS  Final   Report Status PENDING  Incomplete  Culture, bal-quantitative     Status: None   Collection Time: 04/22/15  3:14 PM  Result Value Ref Range Status   Specimen Description BRONCHIAL ALVEOLAR LAVAGE  Final   Special Requests Normal  Final   Gram Stain   Final    MANY WBC SEEN MANY GRAM NEGATIVE RODS RARE GRAM POSITIVE COCCI IN CLUSTERS EXCELLENT SPECIMEN - 90-100% WBCS    Culture   Final    HEAVY GROWTH PSEUDOMONAS AERUGINOSA HEAVY GROWTH ENTEROBACTER AEROGENES    Report Status 04/26/2015 FINAL  Final   Organism ID, Bacteria PSEUDOMONAS AERUGINOSA  Final   Organism ID, Bacteria ENTEROBACTER AEROGENES  Final      Susceptibility   Enterobacter aerogenes - MIC*    CEFTAZIDIME Value in next row Sensitive      SENSITIVE4    CEFAZOLIN Value in next row Resistant      RESISTANT>=64    CEFTRIAXONE Value in next row Sensitive      SENSITIVE4    CIPROFLOXACIN Value in next row Sensitive      SENSITIVE<=0.25    GENTAMICIN Value in next row Sensitive      SENSITIVE<=1    IMIPENEM Value in next row Sensitive      SENSITIVE<=1    PIP/TAZO Value in next row Intermediate      INTERMEDIATE32    LEVOFLOXACIN Value in next row Sensitive      SENSITIVE<=0.12    * HEAVY GROWTH ENTEROBACTER AEROGENES   Pseudomonas aeruginosa - MIC*    CEFTAZIDIME Value in next row Sensitive      SENSITIVE4    CIPROFLOXACIN Value in next row Sensitive      SENSITIVE<=0.25    GENTAMICIN Value in next row Sensitive      SENSITIVE<=1    IMIPENEM Value in next row Sensitive      SENSITIVE1    PIP/TAZO Value in next row Sensitive      SENSITIVE8    * HEAVY GROWTH PSEUDOMONAS AERUGINOSA  MRSA PCR Screening     Status: None   Collection Time:  04/22/15  5:23 PM  Result Value Ref Range Status   MRSA by PCR NEGATIVE NEGATIVE Final    Comment:        The GeneXpert MRSA Assay (FDA approved for NASAL specimens only), is one component of a comprehensive MRSA colonization surveillance program. It is not intended to diagnose MRSA infection nor to guide or monitor treatment for MRSA infections.     Medical History: Past Medical History  Diagnosis Date  . Cancer (HCC)     laryngeal cancer, prostrate cancer  . COPD (chronic obstructive pulmonary disease) ([place])   . Coronary artery disease   . Stroke [facility])     no residual weakness  . Depression   . Sleep apnea   . MRSA pneumonia ([place])   . Barrett's esophagus   . BPH (benign prostatic hyperplasia)   . Nephrolithiasis   . Osteoarthritis   . Anemia     Medications:  Prescriptions prior to admission  Medication Sig Dispense Refill Last Dose  . aspirin EC 81 MG tablet 81 mg by PEG Tube route daily.    05/05/2015 at 1200  . ciprofloxacin (CIPRO) 750 MG tablet Place 1 tablet (750 mg total) into feeding tube 2 (two) times daily. 32 tablet 0 05/05/2015 at Unknown time  . citalopram (CELEXA)  20 MG tablet 20 mg by PEG Tube route daily.    05/05/2015 at Unknown time  . EPINEPHrine (EPIPEN 2-PAK) 0.3 mg/0.3 mL IJ SOAJ injection Inject 0.3 mg into the muscle once as needed (for severe allergic reaction).   PRN at PRN  . ferrous sulfate 325 (65 FE) MG EC tablet 325 mg by PEG Tube route daily.    05/05/2015 at Unknown time  . folic acid (FOLVITE) 1 MG tablet 1 mg by PEG Tube route daily.    05/05/2015 at Unknown time  . guaiFENesin (MUCINEX) 600 MG 12 hr tablet 600 mg by PEG Tube route 2 (two) times daily.    05/05/2015 at 1200  . guaifenesin (ROBITUSSIN) 100 MG/5ML syrup Take 400 mg by mouth every 4 (four) hours as needed for cough.   05/04/2015 at 1300  . HYDROcodone-acetaminophen (NORCO/VICODIN) 5-325 MG per tablet 1 tablet by PEG Tube route 3 (three) times daily as needed for moderate pain.     05/05/2015 at 1200  . ipratropium-albuterol (DUONEB) 0.5-2.5 (3) MG/3ML SOLN Take 3 mLs by nebulization every 6 (six) hours as needed (for shortness of breath).   05/05/2015 at Unknown time  . loratadine (CLARITIN) 10 MG tablet 10 mg by PEG Tube route daily.    05/05/2015 at Unknown time  . LORazepam (ATIVAN) 0.5 MG tablet 0.5 mg by PEG Tube route 4 (four) times daily as needed for anxiety.    05/05/2015 at Unknown time  . montelukast (SINGULAIR) 10 MG tablet 10 mg by PEG Tube route daily.    05/05/2015 at Unknown time  . Multiple Vitamin (MULTIVITAMIN WITH MINERALS) TABS tablet 1 tablet by PEG Tube route daily.    05/05/2015 at Unknown time  . Nutritional Supplements (FEEDING SUPPLEMENT, JEVITY 1.5 CAL/FIBER,) LIQD Place 1,000 mLs into feeding tube continuous.   05/05/2015 at Unknown time  . pantoprazole (PROTONIX) 40 MG tablet 40 mg by PEG Tube route 2 (two) times daily.    05/05/2015 at Unknown time  . tamsulosin (FLOMAX) 0.4 MG CAPS capsule 0.4 mg by PEG Tube route daily.    05/04/2015 at Unknown time  . Water For Irrigation, Sterile (FREE WATER) SOLN Place 250 mLs into feeding tube every 8 (eight) hours.   05/05/2015 at Unknown time  . zolpidem (AMBIEN) 5 MG tablet 5 mg by PEG Tube route at bedtime as needed for sleep.    05/04/2015 at Unknown time  . fluconazole (DIFLUCAN) 100 MG tablet Place 1 tablet (100 mg total) into feeding tube daily. (Patient not taking: Reported on 05/05/2015) 5 tablet 0   . predniSONE (DELTASONE) 5 MG tablet 4 tabs peg day1; 3 tabs day2; 2 tabs day3; 1 tab day4,5 (Patient not taking: Reported on 05/05/2015) 11 tablet 0    Assessment: Pseudomonas risk factors:   Prior use of cipro, MD suspects nosocomial PNA.  CrCl = 77.1 ml/min Ke = 0.07 hr-1 T1/2 = 9.9 hrs Vd = 52.9 L   Goal of Therapy:  Vancomycin trough level 10-15 mcg/ml  Plan:  Expected duration 7 days with resolution of temperature and/or normalization of WBC   Will start Zosyn 4.5 gm IV Q8H EI .  Vancomycin 1  gm IV X 1 given on 11/08 @ 20:00. Vancomycin 1 gm IV Q12H ordered to start 11/09 @ 8:00. Stacked dosing not appropriate for this pt. This pt will reach Css by 11/10 @ 20:00. Will draw 1st trough on 11/10 @ 19:30, which will be at Css.   Stephen Taylor D 05/05/2015,10:34 PM

## 2015-05-05 NOTE — ED Notes (Signed)
Pt arrived via EMS with complaints of shortness of breath. Pt caregiver reported shallow breathing. Pt has a history of throat cancer and has recently been diagnosed with additional masses in lungs. EMS 120/64 80 HR, 22 R, 100% NRB. Pt has trach.

## 2015-05-05 NOTE — H&P (Signed)
[facility] [place] NAME: Stephen Taylor    MR#:  [id number]  DATE OF BIRTH:  08-20-1931  DATE OF ADMISSION:  05/05/2015  PRIMARY CARE PHYSICIAN: Lavera Guise, MD   REQUESTING/REFERRING PHYSICIAN: Dr. Harvest Dark  CHIEF COMPLAINT:   Chief Complaint  Patient presents with  . Shortness of Breath    HISTORY OF PRESENT ILLNESS:  Stephen Taylor  is a 81 y.o. male with a known history of laryngeal cancer status post tracheostomy, history of prostate cancer, COPD, chronic respiratory failure, coronary disease, history of previous CVA, osteoarthritis, chronic anemia, recent pseudomonal pneumonia, who presents to the hospital due to worsening respiratory distress and also weakness. Patient was recently discharged from the hospital and treated for nosocomial pneumonia with IV ceftaz and discharged on oral ciprofloxacin along with Diflucan. As per the wife patient's respiratory status has been quite tenuous at home and he has been more short of breath at nights and not sleeping well. This morning he was significantly weak and short of breath and therefore was brought to the ER for further evaluation. As per the wife patient's home health nurse noted that he was having shallow breathing worse than usual and recommended coming to the hospital. Patient has had no fevers, chills, nausea, vomiting, worsening sputum production, or any other associated symptoms presently. Patient presented to the hospital was noted to be in acute hypoxic respiratory failure with chest x-ray findings suggestive of possible pneumonia and hospitalist services were contacted further treatment and evaluation.  PAST MEDICAL HISTORY:   Past Medical History  Diagnosis Date  . Cancer (HCC)     laryngeal cancer, prostrate cancer  . COPD (chronic obstructive pulmonary disease) ([place])   . Coronary artery disease   . Stroke [facility])     no residual weakness  . Depression   .  Sleep apnea   . MRSA pneumonia ([place])   . Barrett's esophagus   . BPH (benign prostatic hyperplasia)   . Nephrolithiasis   . Osteoarthritis   . Anemia     PAST SURGICAL HISTORY:   Past Surgical History  Procedure Laterality Date  . Tracheostomy N/A   . Gastrostomy w/ feeding tube N/A   . Laminectomy    . Rotator cuff repair    . Lung removal, partial    . Esophageogastrectomy    . Peg tube placement    . Renal calculi    . Ureteral stent placement    . Flexible bronchoscopy N/A 12/24/2014    Procedure: FLEXIBLE BRONCHOSCOPY;  Surgeon: Allyne Gee, MD;  Location: [facility] ORS;  Service: Pulmonary;  Laterality: N/A;    SOCIAL HISTORY:   Social History  Substance Use Topics  . Smoking status: Former Smoker -- 2.00 packs/day for 30 years    Types: Cigarettes    Quit date: 12/23/1984  . Smokeless tobacco: Not on file  . Alcohol Use: No    FAMILY HISTORY:   Family History  Problem Relation Age of Onset  . Diabetes Brother     DRUG ALLERGIES:   Allergies  Allergen Reactions  . Morphine And Related Other (See Comments)    Reaction:  Altered mental status    REVIEW OF SYSTEMS:   Review of Systems  Constitutional: Positive for malaise/fatigue. Negative for fever and weight loss.  HENT: Negative for congestion, nosebleeds and tinnitus.   Eyes: Negative for blurred vision, double vision and redness.  Respiratory: Positive for sputum production and shortness of  breath. Negative for cough and hemoptysis.   Cardiovascular: Negative for chest pain, orthopnea, leg swelling and PND.  Gastrointestinal: Negative for nausea, vomiting, abdominal pain, diarrhea and melena.  Genitourinary: Negative for dysuria, urgency and hematuria.  Musculoskeletal: Negative for joint pain and falls.  Neurological: Positive for weakness (generalized). Negative for dizziness, tingling, sensory change, focal weakness, seizures and headaches.  Endo/Heme/Allergies: Negative for polydipsia. Does not  bruise/bleed easily.  Psychiatric/Behavioral: Negative for depression and memory loss. The patient is not nervous/anxious.     MEDICATIONS AT HOME:   Prior to Admission medications   Medication Sig Start Date End Date Taking? Authorizing Provider  aspirin EC 81 MG tablet 81 mg by PEG Tube route daily.    Yes Historical Provider, MD  ciprofloxacin (CIPRO) 750 MG tablet Place 1 tablet (750 mg total) into feeding tube 2 (two) times daily. 04/27/15  Yes Richard Leslye Peer, MD  citalopram (CELEXA) 20 MG tablet 20 mg by PEG Tube route daily.    Yes Historical Provider, MD  EPINEPHrine (EPIPEN 2-PAK) 0.3 mg/0.3 mL IJ SOAJ injection Inject 0.3 mg into the muscle once as needed (for severe allergic reaction).   Yes Historical Provider, MD  ferrous sulfate 325 (65 FE) MG EC tablet 325 mg by PEG Tube route daily.    Yes Historical Provider, MD  folic acid (FOLVITE) 1 MG tablet 1 mg by PEG Tube route daily.    Yes Historical Provider, MD  guaiFENesin (MUCINEX) 600 MG 12 hr tablet 600 mg by PEG Tube route 2 (two) times daily.    Yes Historical Provider, MD  guaifenesin (ROBITUSSIN) 100 MG/5ML syrup Take 400 mg by mouth every 4 (four) hours as needed for cough.   Yes Historical Provider, MD  HYDROcodone-acetaminophen (NORCO/VICODIN) 5-325 MG per tablet 1 tablet by PEG Tube route 3 (three) times daily as needed for moderate pain.    Yes Historical Provider, MD  ipratropium-albuterol (DUONEB) 0.5-2.5 (3) MG/3ML SOLN Take 3 mLs by nebulization every 6 (six) hours as needed (for shortness of breath).   Yes Historical Provider, MD  loratadine (CLARITIN) 10 MG tablet 10 mg by PEG Tube route daily.    Yes Historical Provider, MD  LORazepam (ATIVAN) 0.5 MG tablet 0.5 mg by PEG Tube route 4 (four) times daily as needed for anxiety.    Yes Historical Provider, MD  montelukast (SINGULAIR) 10 MG tablet 10 mg by PEG Tube route daily.    Yes Historical Provider, MD  Multiple Vitamin (MULTIVITAMIN WITH MINERALS) TABS tablet 1  tablet by PEG Tube route daily.    Yes Historical Provider, MD  Nutritional Supplements (FEEDING SUPPLEMENT, JEVITY 1.5 CAL/FIBER,) LIQD Place 1,000 mLs into feeding tube continuous. 04/27/15  Yes Richard Leslye Peer, MD  pantoprazole (PROTONIX) 40 MG tablet 40 mg by PEG Tube route 2 (two) times daily.    Yes Historical Provider, MD  tamsulosin (FLOMAX) 0.4 MG CAPS capsule 0.4 mg by PEG Tube route daily.    Yes Historical Provider, MD  Water For Irrigation, Sterile (FREE WATER) SOLN Place 250 mLs into feeding tube every 8 (eight) hours. 04/27/15  Yes Loletha Grayer, MD  zolpidem (AMBIEN) 5 MG tablet 5 mg by PEG Tube route at bedtime as needed for sleep.    Yes Historical Provider, MD  fluconazole (DIFLUCAN) 100 MG tablet Place 1 tablet (100 mg total) into feeding tube daily. Patient not taking: Reported on 05/05/2015 04/27/15   Loletha Grayer, MD  predniSONE (DELTASONE) 5 MG tablet 4 tabs peg day1; 3 tabs day2;  2 tabs day3; 1 tab day4,5 Patient not taking: Reported on 05/05/2015 04/27/15   Loletha Grayer, MD      VITAL SIGNS:  Blood pressure 120/48, pulse 85, temperature 97.9 F (36.6 C), temperature source Oral, resp. rate 27, SpO2 99 %.  PHYSICAL EXAMINATION:  Physical Exam  GENERAL:  81 y.o.-year-old patient lying in the bed in mild respiratory distress.  EYES: Pupils equal, round, reactive to light and accommodation. No scleral icterus. Extraocular muscles intact.  HEENT: Head atraumatic, normocephalic. Oropharynx and nasopharynx clear. No oropharyngeal erythema, moist oral mucosa.  Positive tracheostomy with trach collar NECK:  Supple, no jugular venous distention. No thyroid enlargement, no tenderness.  LUNGS: Diffuse rhonchi bilaterally, positive use of accessory muscles. No dullness to percussion. CARDIOVASCULAR: S1, S2 RRR, tachycardic. No murmurs, rubs, gallops, clicks.  ABDOMEN: Soft, nontender, nondistended. Bowel sounds present. No organomegaly or mass.  EXTREMITIES: +1 dependent  pedal edema bilaterally, no cyanosis, or clubbing. + 2 pedal & radial pulses b/l.  NEUROLOGIC: Cranial nerves II through XII are intact. No focal Motor or sensory deficits appreciated b/l.  Globally weak PSYCHIATRIC: The patient is alert and oriented x 3. Good affect.  SKIN: No obvious rash, lesion, or ulcer.   LABORATORY PANEL:   CBC  Recent Labs Lab 05/05/15 1542  WBC 15.7*  HGB 8.0*  HCT 27.4*  PLT 251   ------------------------------------------------------------------------------------------------------------------  Chemistries   Recent Labs Lab 05/05/15 1542  NA 142  K 4.1  CL 100*  CO2 39*  GLUCOSE 221*  BUN 30*  CREATININE 0.64  CALCIUM 8.4*  AST 19  ALT 22  ALKPHOS 151*  BILITOT 0.5   ------------------------------------------------------------------------------------------------------------------  Cardiac Enzymes  Recent Labs Lab 05/05/15 1542  TROPONINI 0.03   ------------------------------------------------------------------------------------------------------------------  RADIOLOGY:  Dg Chest Portable 1 View  05/05/2015  CLINICAL DATA:  Dyspnea today. EXAM: PORTABLE CHEST 1 VIEW COMPARISON:  Single view of the chest 04/27/2015 and 04/24/2015. CT chest 04/21/2015. FINDINGS: Bilateral lower lobe masses are identified as on the prior exams. Small bilateral pleural effusions are seen. Airspace disease is seen in the left mid and lower lung zone and right lower lung zone. Heart size is upper normal. IMPRESSION: Airspace disease in the left mid and lower lung zones and right lower lung zone could be due to atelectasis, pneumonia or asymmetric edema. Small bilateral pleural effusions. Bilateral lower lobe masses as seen on comparison CT. Electronically Signed   By: Inge Rise M.D.   On: 05/05/2015 16:03     IMPRESSION AND PLAN:   81 year old male with past medical history of laryngeal cancer status post tracheostomy, history of prostate cancer,  COPD, chronic respiratory failure, GERD, history of recent pseudomonal pneumonia, chronic anemia, history of previous CVA who presents to the hospital with shortness of breath and weakness.  #1 acute on chronic respiratory failure with hypoxia-this is likely secondary to suspected pneumonia. -Continue O2 support with trach collar. Continue aggressive pulmonary toileting. -I will treat the patient with broad-spectrum IV antibiotics with vancomycin, Zosyn. -We'll get a pulmonary consult the patient may benefit from a bronchoscopy and clean out. -Follow clinically.  #2 pneumonia-likely nosocomial pneumonia. Patient recently had a pseudomonal pneumonia. -I will treat the patient with IV vancomycin, Zosyn. Follow blood, sputum cultures. -We will get pulmonary consult for possible need of bronchoscopy. Continue aggressive pulmonary toileting.  #3 chronic anemia-hemoglobin stable. Continue iron supplements.  #4 depression-continue Celexa.  #5 GERD-continue Protonix.  #6 BPH-continue Flomax.  #7 history of laryngeal cancer status post tracheostomy-continue aggressive  pulmonary toileting, trach care per protocol. Continue tube feedings we'll consult dietary.  Patient has had 3 hospitalizations in the past month and a half. This is a poor prognostic sign. I will get a palliative care consult to discuss goals of care. Patient presently is a Limited code.  All the records are reviewed and case discussed with ED provider. Management plans discussed with the patient, family and they are in agreement.  CODE STATUS: Limited code  TOTAL TIME TAKING CARE OF THIS PATIENT: 50 minutes.    Henreitta Leber M.D on 05/05/2015 at 7:51 PM  Between 7am to 6pm - Pager - [phone]  After 6pm go to [email or web address] - password [email or web address] [facility]  Office  [phone]  CC: Primary care physician; Lavera Guise, MD

## 2015-05-05 NOTE — Progress Notes (Signed)
[facility]  Patient Status: active  [facility] is providing the following services: SN/PT/MSW  If patient discharges after hours, please call [phone].   Stephen Taylor 05/05/2015, 4:48 PM

## 2015-05-05 NOTE — ED Notes (Signed)
Respiratory therapy at bedside.

## 2015-05-05 NOTE — ED Provider Notes (Signed)
[facility] Emergency Department Provider Note  Time seen: 3:29 PM  I have reviewed the triage vital signs and the nursing notes.   HISTORY  Chief Complaint Shortness of Breath    HPI Stephen Taylor is a 81 y.o. male with a past medical history of COPD, CVA, depression, arthritis, anemia, status post PEG, status post tracheostomy, who presents to the emergency department with difficulty breathing. According to the patient, EMS report, and his wife the patient has been intermittently taking antibiotics for pneumonia for the past 6 months. Patient was most recently diagnosed with pneumonia approximately 2.5-3 weeks ago and discharged from the hospital approximately 2 weeks ago. Patient was seen in the emergency department recently for shortness of breath likely due to a clogged tracheostomy. After suctioning the patient felt better. Today the patient states he has had very thick yellow/green discharge/sputum from his tracheostomy, occasional cough, feels short of breath. Occasional cough. Denies fever. Denies chest pain. Describes his shortness of breath is mild to moderate.    Past Medical History  Diagnosis Date  . Cancer (HCC)     laryngeal cancer, prostrate cancer  . COPD (chronic obstructive pulmonary disease) ([place])   . Coronary artery disease   . Stroke [facility])     no residual weakness  . Depression   . Sleep apnea   . MRSA pneumonia ([place])   . Barrett's esophagus   . BPH (benign prostatic hyperplasia)   . Nephrolithiasis   . Osteoarthritis   . Anemia     Patient Active Problem List   Diagnosis Date Noted  . Protein-calorie malnutrition, severe 04/22/2015  . Hospital acquired PNA   . Acute respiratory failure ([place]) 04/21/2015  . Pneumonia 04/06/2015    Past Surgical History  Procedure Laterality Date  . Tracheostomy N/A   . Gastrostomy w/ feeding tube N/A   . Laminectomy    . Rotator cuff repair    . Lung removal, partial    .  Esophageogastrectomy    . Peg tube placement    . Renal calculi    . Ureteral stent placement    . Flexible bronchoscopy N/A 12/24/2014    Procedure: FLEXIBLE BRONCHOSCOPY;  Surgeon: Allyne Gee, MD;  Location: [facility] ORS;  Service: Pulmonary;  Laterality: N/A;    Current Outpatient Rx  Name  Route  Sig  Dispense  Refill  . aspirin EC 81 MG tablet   PEG Tube   81 mg by PEG Tube route daily.          . ciprofloxacin (CIPRO) 750 MG tablet   Per Tube   Place 1 tablet (750 mg total) into feeding tube 2 (two) times daily.   32 tablet   0   . citalopram (CELEXA) 20 MG tablet   PEG Tube   20 mg by PEG Tube route daily.          Marland Kitchen EPINEPHrine (EPIPEN 2-PAK) 0.3 mg/0.3 mL IJ SOAJ injection   Intramuscular   Inject 0.3 mg into the muscle once as needed (for severe allergic reaction).         . Fe Fum-FA-B Cmp-C-Zn-Mg-Mn-Cu (CENTRATEX) 106-1 MG CAPS   PEG Tube   1 capsule by PEG Tube route daily.          . ferrous sulfate 325 (65 FE) MG EC tablet   PEG Tube   325 mg by PEG Tube route daily.          . fluconazole (DIFLUCAN) 100  MG tablet   Per Tube   Place 1 tablet (100 mg total) into feeding tube daily.   5 tablet   0   . folic acid (FOLVITE) 1 MG tablet   PEG Tube   1 mg by PEG Tube route daily.          Marland Kitchen guaiFENesin (MUCINEX) 600 MG 12 hr tablet   Oral   Take 600 mg by mouth 2 (two) times daily.         Marland Kitchen guaifenesin (ROBITUSSIN) 100 MG/5ML syrup   Oral   Take 400 mg by mouth every 4 (four) hours as needed for cough.         Marland Kitchen HYDROcodone-acetaminophen (NORCO/VICODIN) 5-325 MG per tablet   PEG Tube   1 tablet by PEG Tube route 3 (three) times daily as needed for moderate pain.          Marland Kitchen ipratropium-albuterol (DUONEB) 0.5-2.5 (3) MG/3ML SOLN   Nebulization   Take 3 mLs by nebulization every 6 (six) hours as needed (for shortness of breath).         . loratadine (CLARITIN) 10 MG tablet   PEG Tube   10 mg by PEG Tube route daily.           Marland Kitchen LORazepam (ATIVAN) 0.5 MG tablet   PEG Tube   0.5 mg by PEG Tube route 4 (four) times daily as needed for anxiety.          . montelukast (SINGULAIR) 10 MG tablet   PEG Tube   10 mg by PEG Tube route at bedtime.          . Multiple Vitamin (MULTIVITAMIN WITH MINERALS) TABS tablet   PEG Tube   1 tablet by PEG Tube route daily.          . Nutritional Supplements (FEEDING SUPPLEMENT, JEVITY 1.5 CAL/FIBER,) LIQD   Per Tube   Place 1,000 mLs into feeding tube continuous.         . pantoprazole (PROTONIX) 40 MG tablet   PEG Tube   40 mg by PEG Tube route 2 (two) times daily.          . predniSONE (DELTASONE) 5 MG tablet      4 tabs peg day1; 3 tabs day2; 2 tabs day3; 1 tab day4,5   11 tablet   0   . tamsulosin (FLOMAX) 0.4 MG CAPS capsule   PEG Tube   0.4 mg by PEG Tube route daily.          . Water For Irrigation, Sterile (FREE WATER) SOLN   Per Tube   Place 250 mLs into feeding tube every 8 (eight) hours.         Marland Kitchen zolpidem (AMBIEN) 5 MG tablet   PEG Tube   5 mg by PEG Tube route at bedtime as needed for sleep.            Allergies Morphine and related  Family History  Problem Relation Age of Onset  . Diabetes Brother     Social History Social History  Substance Use Topics  . Smoking status: Former Smoker -- 2.00 packs/day for 30 years    Types: Cigarettes    Quit date: 12/23/1984  . Smokeless tobacco: None  . Alcohol Use: No    Review of Systems Constitutional: Negative for fever. Cardiovascular: Negative for chest pain. Respiratory: Positive for shortness of breath. Positive for sputum from his tracheostomy. Gastrointestinal: Negative for abdominal pain Genitourinary: Negative for  dysuria. Musculoskeletal: Negative for back pain. Neurological: Negative for headache 10-point ROS otherwise negative.  ____________________________________________   PHYSICAL EXAM:  VITAL SIGNS: ED Triage Vitals  Enc Vitals Group     BP 05/05/15  1525 128/65 mmHg     Pulse Rate 05/05/15 1525 82     Resp 05/05/15 1525 20     Temp 05/05/15 1525 97.9 F (36.6 C)     Temp Source 05/05/15 1525 Oral     SpO2 05/05/15 1525 100 %     Weight --      Height --      Head Cir --      Peak Flow --      Pain Score --      Pain Loc --      Pain Edu? --      Excl. in [place]? --    Constitutional: Alert and oriented. Well appearing and in no distress. Eyes: Normal exam ENT   Head: Normocephalic and atraumatic.   Mouth/Throat: Mucous membranes are moist. Cardiovascular: Normal rate, regular rhythm. No murmur Respiratory: Patient with mild wheeze bilaterally, mild to moderate rhonchi right greater than left. No rales. Tracheostomy present. Mild tachypnea. Gastrointestinal: Soft and nontender. No distention.   Musculoskeletal: Nontender with normal range of motion in all extremities. Equal 2+ Loc [place] edema, nontender. Neurologic:  Normal speech and language. No gross focal neurologic deficits  Skin:  Skin is warm, dry and intact.  Psychiatric: Mood and affect are normal. Speech and behavior are normal.   ____________________________________________    EKG  EKG reviewed and interpreted by myself shows normal sinus rhythm at 83 bpm, narrow QRS, normal axis, normal intervals, nonspecific ST changes present. No C elevations noted.  ____________________________________________    RADIOLOGY  Chest x-ray shows left-sided and right-sided opacity  ____________________________________________    INITIAL IMPRESSION / ASSESSMENT AND PLAN / ED COURSE  Pertinent labs & imaging results that were available during my care of the patient were reviewed by me and considered in my medical decision making (see chart for details).  Patient with shortness of breath. We will have respiratory suction the patient tracheostomy, obtain chest x-ray, labs and EKG to help further evaluate. Currently the patient has an oxygen saturation in the 80s on  room air. Wears oxygen at home.  Chest x-ray consistent with opacities bilaterally, likely pneumonia given the patient's increased sputum, increased shortness of breath. Patient currently satting 100% on 55% oxygen. Continues to be tachypneic, with subjective dyspnea, wheeze and rhonchi. We'll admit to the hospital for HCAP.    ____________________________________________   FINAL CLINICAL IMPRESSION(S) / ED DIAGNOSES  Dyspnea Health care associated pneumonia  Harvest Dark, MD 05/05/15 [phone]

## 2015-05-06 LAB — BASIC METABOLIC PANEL
Anion gap: 4 — ABNORMAL LOW (ref 5–15)
BUN: 23 mg/dL — AB (ref 6–20)
CHLORIDE: 101 mmol/L (ref 101–111)
CO2: 38 mmol/L — AB (ref 22–32)
CREATININE: 0.56 mg/dL — AB (ref 0.61–1.24)
Calcium: 8.3 mg/dL — ABNORMAL LOW (ref 8.9–10.3)
GFR calc non Af Amer: >60 mL/min (ref >60)
Glucose, Bld: 125 mg/dL — ABNORMAL HIGH (ref 65–99)
POTASSIUM: 3.8 mmol/L (ref 3.5–5.1)
SODIUM: 143 mmol/L (ref 135–145)

## 2015-05-06 LAB — CBC
HEMATOCRIT: 25.7 % — AB (ref 40.0–52.0)
Hemoglobin: 7.6 g/dL — ABNORMAL LOW (ref 13.0–18.0)
MCH: 20.8 pg — AB (ref 26.0–34.0)
MCHC: 29.5 g/dL — ABNORMAL LOW (ref 32.0–36.0)
MCV: 70.5 fL — AB (ref 80.0–100.0)
PLATELETS: 234 10*3/uL (ref 150–440)
RBC: 3.65 MIL/uL — AB (ref 4.40–5.90)
RDW: 19.8 % — ABNORMAL HIGH (ref 11.5–14.5)
WBC: 14.6 10*3/uL — AB (ref 3.8–10.6)

## 2015-05-06 MED ORDER — CETYLPYRIDINIUM CHLORIDE 0.05 % MT LIQD
7.0000 mL | Freq: Two times a day (BID) | OROMUCOSAL | Status: DC
  Administered 2015-05-06 – 2015-05-07 (×3): 7 mL via OROMUCOSAL

## 2015-05-06 MED ORDER — PIPERACILLIN-TAZOBACTAM 3.375 G IVPB
3.3750 g | Freq: Three times a day (TID) | INTRAVENOUS | Status: DC
  Administered 2015-05-06 – 2015-05-08 (×6): 3.375 g via INTRAVENOUS
  Filled 2015-05-06 (×8): qty 50

## 2015-05-06 MED ORDER — CHLORHEXIDINE GLUCONATE 0.12 % MT SOLN
15.0000 mL | Freq: Two times a day (BID) | OROMUCOSAL | Status: DC
  Administered 2015-05-06 – 2015-05-08 (×5): 15 mL via OROMUCOSAL
  Filled 2015-05-06 (×5): qty 15

## 2015-05-06 NOTE — Care Management (Signed)
List of Hospice agencies provided to patient and wife.  [facility] was selected.  Contacted [facility] and notified of referral.

## 2015-05-06 NOTE — Progress Notes (Signed)
A&O. Admitted from home with shortness of breath. Trach in place, O2 delivered via trach collar. Suctioned multiple times. Trach cleaned. IV antibiotics given. Jevity feeds infusing continously via peg tube. Skin verified with Lexi. Cream and foam pad put on excoriated area on buttocks and sacrum.

## 2015-05-06 NOTE — Evaluation (Signed)
Physical Therapy Evaluation Patient Details Name: Stephen Taylor MRN: [id number] DOB: [birth year] Today's Date: 05/06/2015   History of Present Illness  Pt is an 81 yo male who was admitted to the hospital for pneumonia. He was recently admitted 04/21/15 - 04/27/15 for pneumonia  Clinical Impression  Pt presents with hx of cancer, COPD, CAD, stroke, OA, and anemia. Examination reveals that pt performs bed mobility at min A, transfers at min A, and ambulation of only 4 ft at min A. Pt is shown to have generalized weakness, particularly in bilateral LEs. Although pt has somewhat decent AROM, his functional strength is greatly diminished. His current mobility is far removed from his baseline. Due to his strength and endurance deficits, he will continue to benefit from skilled PT for return to optimal PLOF. SaO2 on trach never fell below 90% during or after ambulation. Pt very pleasant and motivated to move.    Follow Up Recommendations SNF    Equipment Recommendations  None recommended by PT    Recommendations for Other Services       Precautions / Restrictions Precautions Precautions: Fall Restrictions Weight Bearing Restrictions: No      Mobility  Bed Mobility Overal bed mobility: Needs Assistance Bed Mobility: Supine to Sit     Supine to sit: Min assist     General bed mobility comments: Pt requires assist for scooting and a hand-to-hand pull to get to EOB  Transfers Overall transfer level: Needs assistance   Transfers: Sit to/from Stand Sit to Stand: Min assist;From elevated surface         General transfer comment: Pt requires momentum to get up into standing and minor assist from therapist. Bed needs to be elevated for pt as well.   Ambulation/Gait Ambulation/Gait assistance: Min assist Ambulation Distance (Feet): 4 Feet Assistive device: Rolling walker (2 wheeled) Gait Pattern/deviations: Step-to pattern;Decreased step length - right;Decreased step length -  left;Decreased stride length;Shuffle;Trunk flexed Gait velocity: decreased Gait velocity interpretation: Below normal speed for age/gender General Gait Details: Pt slightly unsteady ambulating with RW and needs assist at times for balance correction. They are very minor LOBs. Pt dyspnic following ambulation to chair  Stairs            Wheelchair Mobility    Modified Rankin (Stroke Patients Only)       Balance Overall balance assessment: Needs assistance         Standing balance support: Bilateral upper extremity supported Standing balance-Leahy Scale: Fair Standing balance comment: bilateral sway                              Pertinent Vitals/Pain Pain Assessment: No/denies pain    Home Living Family/patient expects to be discharged to:: Private residence Living Arrangements: Spouse/significant other Available Help at Discharge: Family;Available 24 hours/day Type of Home: House Home Access: Level entry     Home Layout: One level Home Equipment: Walker - 2 wheels;Cane - single point;Bedside commode      Prior Function Level of Independence: Independent with assistive device(s)         Comments: Pt was able to ambulate community distances and was indep with ADLs     Hand Dominance        Extremity/Trunk Assessment   Upper Extremity Assessment: Overall WFL for tasks assessed           Lower Extremity Assessment: Generalized weakness (grossly 3+/5 MMT )  Communication   Communication: No difficulties  Cognition Arousal/Alertness: Awake/alert Behavior During Therapy: WFL for tasks assessed/performed Overall Cognitive Status: Within Functional Limits for tasks assessed                      General Comments      Exercises Other Exercises Other Exercises: Pt performed bilateral therex x 10 reps at min assist for facilitation of movement. Exercises included: ankle pumps, SAQ, SLR, LAQ, and hip abd        Assessment/Plan    PT Assessment Patient needs continued PT services  PT Diagnosis Difficulty walking;Abnormality of gait;Generalized weakness   PT Problem List Decreased strength;Decreased balance;Decreased activity tolerance;Decreased mobility;Cardiopulmonary status limiting activity  PT Treatment Interventions DME instruction;Gait training;Stair training;Functional mobility training;Therapeutic activities;Therapeutic exercise;Balance training;Neuromuscular re-education   PT Goals (Current goals can be found in the Care Plan section) Acute Rehab PT Goals Patient Stated Goal: none PT Goal Formulation: With patient Time For Goal Achievement: 05/20/15 Potential to Achieve Goals: Good    Frequency Min 2X/week   Barriers to discharge        Co-evaluation               End of Session Equipment Utilized During Treatment: Gait belt Activity Tolerance: Patient tolerated treatment well Patient left: in chair;with call bell/phone within reach;with chair alarm set Nurse Communication: Mobility status         Time: 1003-1030 PT Time Calculation (min) (ACUTE ONLY): 27 min   Charges:         PT G CodesJanyth Contes 2015/06/01, 1:30 PM  Janyth Contes, SPT. [phone]

## 2015-05-06 NOTE — Clinical Social Work Note (Signed)
Clinical Social Work Assessment  Patient Details  Name: Stephen Taylor MRN: [id number] Date of Birth: 1932/06/20  Date of referral:  05/06/15               Reason for consult:  Facility Placement                Permission sought to share information with:  Family Supports Permission granted to share information::  Yes, Verbal Permission Granted  Name::      (Wife)   Housing/Transportation Living arrangements for the past 2 months:  Single Family Home Source of Information:  Patient, Spouse Patient Interpreter Needed:  None Criminal Activity/Legal Involvement Pertinent to Current Situation/Hospitalization:  No - Comment as needed Significant Relationships:  Significant Other Lives with:  Spouse Do you feel safe going back to the place where you live?  Yes Need for family participation in patient care:  Yes (Comment)  Care giving concerns:  Pt needs additional support  Social Worker assessment / plan:  CSW was consulted for SNF as recommended by PT. Pt was readmitted and has deconditioned since last admission. CSW met with pt and wife along with RNCM to address services at discharge. Pt declined SNF. Pt's wife would like hospice services. RNCM will follow for hospice services. CSW will continue to follow for additional services, if needed.   Employment status:  Retired Forensic scientist:  Medicare PT Recommendations:  [place] / Referral to community resources:  Other (Comment Required), Social [facility])  Patient/Family's Response to care:  Pt declined SNF and wife is in agreement.   Patient/Family's Understanding of and Emotional Response to Diagnosis, Current Treatment, and Prognosis:  Pt's wife is open to [facility].   Emotional Assessment Appearance:  Appears older than stated age Attitude/Demeanor/Rapport:  Other (Appropriate) Affect (typically observed):  Accepting Orientation:  Oriented to Self, Oriented to Place,  Oriented to Situation Alcohol / Substance use:  Never Used Psych involvement (Current and /or in the community):  No (Comment)  Discharge Needs  Concerns to be addressed:  Patient refuses services, Adjustment to Illness Readmission within the last 30 days:  Yes Current discharge risk:  Chronically ill Barriers to Discharge:  Barriers Resolved   Darden Dates, LCSW 05/06/2015, 4:34 PM

## 2015-05-06 NOTE — Progress Notes (Signed)
Palliative Care Update  I am aware of this consult and reviewed the record briefly.  Unfortunately, there was a high volume of consult requests for palliative care today, and i am unable to get to this one today.  [place] will be out of the [place] for the next five days also.  Back Tues Nov 15 at around 10 am.  I see that pt has a 'limited code' --but wife is asking about Hospice Services. Pt refuses SNF.  It isn't clear whether pt will need Hospice in his own home or at [facility]. He has a trache, PEG, and FIO2 is at 40% reportedly.  But VS are stable and labs do not indicate imminent demise.  But he has had repeated hospitalizations.      There is more to be discussed but it is simply too late tonight for me to initiate discussions.     If he is still here when i return on Tuesday, I will see him then.   Colleen Can, MD

## 2015-05-06 NOTE — Care Management (Signed)
Patient presents from home with acute on chronic respiratory failure.  Patient has a tracheostomy.  Patient states that he typically uses 5-6 liters of oxygen via trach collar at home.  Patient is currently on 6 liters of oxygen.  Patient lives at home with wife.  Patient states that he has a walker, wheelchair, and suction machine at home.  Patient has a peg tube in place, which is cared for by his wife.  Patient is currently open with home care services with [facility], RN, PT, and SW.  Corene Cornea with [facility] notified of admission.  Per wife a hospital bed is in the process of being ordered.  MD has placed a palliative care consult.  Will provide patient and wife with list of hospital agencies, and make referral. RNCM to follow

## 2015-05-06 NOTE — Progress Notes (Addendum)
[facility] NAME: Stephen Taylor    MR#:  [id number]  DATE OF BIRTH:  [birth year]  SUBJECTIVE:  CHIEF COMPLAINT:   Chief Complaint  Patient presents with  . Shortness of Breath   Cough and shortness of breath. REVIEW OF SYSTEMS:  CONSTITUTIONAL: No fever, has generalized weakness.  EYES: No blurred or double vision.  EARS, NOSE, AND THROAT: No tinnitus or ear pain.  RESPIRATORY: Has cough, shortness of breath, no wheezing or hemoptysis.  CARDIOVASCULAR: No chest pain, orthopnea, edema.  GASTROINTESTINAL: No nausea, vomiting, diarrhea or abdominal pain.  GENITOURINARY: No dysuria, hematuria.  ENDOCRINE: No polyuria, nocturia,  HEMATOLOGY: No anemia, easy bruising or bleeding SKIN: No rash or lesion. MUSCULOSKELETAL: No joint pain or arthritis.   NEUROLOGIC: No tingling, numbness, weakness.  PSYCHIATRY: No anxiety or depression.   DRUG ALLERGIES:   Allergies  Allergen Reactions  . Morphine And Related Other (See Comments)    Reaction:  Altered mental status    VITALS:  Blood pressure 138/48, pulse 94, temperature 98.1 F (36.7 C), temperature source Oral, resp. rate 18, height 5\' 11"  (1.803 m), weight 75.479 kg (166 lb 6.4 oz), SpO2 96 %.  PHYSICAL EXAMINATION:  GENERAL:  81 y.o.-year-old patient lying in the bed with no acute distress.  EYES: Pupils equal, round, reactive to light and accommodation. No scleral icterus. Extraocular muscles intact.  HEENT: Head atraumatic, normocephalic. Positive trach collar. NECK:  Supple, no jugular venous distention. No thyroid enlargement, no tenderness.  LUNGS: Very diminished breath sounds bilaterally, mild crackles. No use of accessory muscles of respiration.  CARDIOVASCULAR: S1, S2 normal. No murmurs, rubs, or gallops.  ABDOMEN: Soft, nontender, nondistended. Bowel sounds present. No organomegaly or mass.  EXTREMITIES: No pedal edema, cyanosis, or clubbing.  NEUROLOGIC:  Cranial nerves II through XII are intact. Muscle strength 3-4/5 in all extremities. Sensation intact. Gait not checked.  PSYCHIATRIC: The patient is alert and oriented x 3.  SKIN: No obvious rash, lesion, or ulcer.    LABORATORY PANEL:   CBC  Recent Labs Lab 05/06/15 0014  WBC 14.6*  HGB 7.6*  HCT 25.7*  PLT 234   ------------------------------------------------------------------------------------------------------------------  Chemistries   Recent Labs Lab 05/05/15 1542 05/06/15 0014  NA 142 143  K 4.1 3.8  CL 100* 101  CO2 39* 38*  GLUCOSE 221* 125*  BUN 30* 23*  CREATININE 0.64 0.56*  CALCIUM 8.4* 8.3*  AST 19  --   ALT 22  --   ALKPHOS 151*  --   BILITOT 0.5  --    ------------------------------------------------------------------------------------------------------------------  Cardiac Enzymes  Recent Labs Lab 05/05/15 1542  TROPONINI 0.03   ------------------------------------------------------------------------------------------------------------------  RADIOLOGY:  Dg Chest Portable 1 View  05/05/2015  CLINICAL DATA:  Dyspnea today. EXAM: PORTABLE CHEST 1 VIEW COMPARISON:  Single view of the chest 04/27/2015 and 04/24/2015. CT chest 04/21/2015. FINDINGS: Bilateral lower lobe masses are identified as on the prior exams. Small bilateral pleural effusions are seen. Airspace disease is seen in the left mid and lower lung zone and right lower lung zone. Heart size is upper normal. IMPRESSION: Airspace disease in the left mid and lower lung zones and right lower lung zone could be due to atelectasis, pneumonia or asymmetric edema. Small bilateral pleural effusions. Bilateral lower lobe masses as seen on comparison CT. Electronically Signed   By: Inge Rise M.D.   On: 05/05/2015 16:03    EKG:   Orders placed or performed during the  hospital encounter of 05/05/15  . ED EKG  . ED EKG  . ED EKG  . ED EKG    ASSESSMENT AND PLAN:   #1 acute on chronic  respiratory failure with hypoxia-this is likely secondary to suspected pneumonia. -Continue O2 support with trach collar. Continue aggressive pulmonary toileting. Continue vancomycin, Zosyn. Per pulmonary consult, Dr. Humphrey Rolls, hospice consult.  #2 pneumonia-likely nosocomial pneumonia. Patient recently had a pseudomonal pneumonia. Continue vancomycin, Zosyn. Follow blood, sputum cultures and a CBC.  #3 chronic anemia-hemoglobin stable. Continue iron supplements.  #4 depression-continue Celexa.  #5 GERD-continue Protonix.  #6 BPH-continue Flomax.  #7 history of laryngeal cancer status post tracheostomy-continue aggressive pulmonary toileting, trach care per protocol. Continue tube feedings.  Palliative care consult. Physical therapy evaluation suggests skilled nursing facility placement. I discussed with Dr. Humphrey Rolls, pulmonary physician.  All the records are reviewed and case discussed with Care Management/Social Workerr. Management plans discussed with the patient, his wife and they are in agreement. Greater than 50% time was spent on coordination of care and face-to-face counseling.  CODE STATUS: Partial code.  TOTAL TIME TAKING CARE OF THIS PATIENT: 46 minutes.   POSSIBLE D/C IN 3 DAYS, DEPENDING ON CLINICAL CONDITION.   Demetrios Loll M.D on 05/06/2015 at 3:06 PM  Between 7am to 6pm - Pager - [phone]  After 6pm go to [email or web address] - password [email or web address] [place] [facility]  Office  [phone]  CC: Primary care physician; Lavera Guise, MD

## 2015-05-06 NOTE — Consult Note (Signed)
Pulmonary Critical Care  Initial Consult Note   Stephen Taylor JKK:[id number] DOB: Sep 07, 1931 DOA: 05/05/2015  Referring physician: America Brown, MD PCP: Lavera Guise, MD   Chief Complaint: shortness of breath  HPI: Stephen Taylor is a 81 y.o. 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 Patient had called the office with worsening of his respiratory status. Patient had been having cough and sputum production. His wife stated that he had been working harder to breath. Patient also felt pretty weak. Patient had recently been in the hospital with pneumonia and had been on IV antibiotics. Patient has in the past not wanted to go to a SNF and still does not but he is requiring more care.   Review of Systems:  Complete 12 point ROS performed and is unremarkable other than noted in HPI  Past Medical History  Diagnosis Date  . Cancer (HCC)     laryngeal cancer, prostrate cancer  . COPD (chronic obstructive pulmonary disease) ([place])   . Coronary artery disease   . Stroke [facility])     no residual weakness  . Depression   . Sleep apnea   . MRSA pneumonia ([place])   . Barrett's esophagus   . BPH (benign prostatic hyperplasia)   . Nephrolithiasis   . Osteoarthritis   . Anemia    Past Surgical History  Procedure Laterality Date  . Tracheostomy N/A   . Gastrostomy w/ feeding tube N/A   . Laminectomy    . Rotator cuff repair    . Lung removal, partial    . Esophageogastrectomy    . Peg tube placement    . Renal calculi    . Ureteral stent placement    . Flexible bronchoscopy N/A 12/24/2014    Procedure: FLEXIBLE BRONCHOSCOPY;  Surgeon: Allyne Gee, MD;  Location: [facility] ORS;  Service: Pulmonary;  Laterality: N/A;   Social History:  reports that he quit smoking about 30 years ago. His smoking use included Cigarettes. He has a 60 pack-year smoking history. He does not have any smokeless tobacco history on  file. He reports that he does not drink alcohol or use illicit drugs.  Allergies  Allergen Reactions  . Morphine And Related Other (See Comments)    Reaction:  Altered mental status    Family History  Problem Relation Age of Onset  . Diabetes Brother     Prior to Admission medications   Medication Sig Start Date End Date Taking? Authorizing Provider  aspirin EC 81 MG tablet 81 mg by PEG Tube route daily.    Yes Historical Provider, MD  ciprofloxacin (CIPRO) 750 MG tablet Place 1 tablet (750 mg total) into feeding tube 2 (two) times daily. 04/27/15  Yes Richard Leslye Peer, MD  citalopram (CELEXA) 20 MG tablet 20 mg by PEG Tube route daily.    Yes Historical Provider, MD  EPINEPHrine (EPIPEN 2-PAK) 0.3 mg/0.3 mL IJ SOAJ injection Inject 0.3 mg into the muscle once as needed (for severe allergic reaction).   Yes Historical Provider, MD  ferrous sulfate 325 (65 FE) MG EC tablet 325 mg by PEG Tube route daily.    Yes Historical Provider, MD  folic acid (FOLVITE) 1 MG tablet 1 mg by PEG Tube route daily.    Yes Historical Provider, MD  guaiFENesin (MUCINEX) 600 MG 12 hr tablet 600 mg by PEG Tube route 2 (two) times daily.    Yes Historical Provider,  MD  guaifenesin (ROBITUSSIN) 100 MG/5ML syrup Take 400 mg by mouth every 4 (four) hours as needed for cough.   Yes Historical Provider, MD  HYDROcodone-acetaminophen (NORCO/VICODIN) 5-325 MG per tablet 1 tablet by PEG Tube route 3 (three) times daily as needed for moderate pain.    Yes Historical Provider, MD  ipratropium-albuterol (DUONEB) 0.5-2.5 (3) MG/3ML SOLN Take 3 mLs by nebulization every 6 (six) hours as needed (for shortness of breath).   Yes Historical Provider, MD  loratadine (CLARITIN) 10 MG tablet 10 mg by PEG Tube route daily.    Yes Historical Provider, MD  LORazepam (ATIVAN) 0.5 MG tablet 0.5 mg by PEG Tube route 4 (four) times daily as needed for anxiety.    Yes Historical Provider, MD  montelukast (SINGULAIR) 10 MG tablet 10 mg by PEG  Tube route daily.    Yes Historical Provider, MD  Multiple Vitamin (MULTIVITAMIN WITH MINERALS) TABS tablet 1 tablet by PEG Tube route daily.    Yes Historical Provider, MD  Nutritional Supplements (FEEDING SUPPLEMENT, JEVITY 1.5 CAL/FIBER,) LIQD Place 1,000 mLs into feeding tube continuous. 04/27/15  Yes Richard Leslye Peer, MD  pantoprazole (PROTONIX) 40 MG tablet 40 mg by PEG Tube route 2 (two) times daily.    Yes Historical Provider, MD  tamsulosin (FLOMAX) 0.4 MG CAPS capsule 0.4 mg by PEG Tube route daily.    Yes Historical Provider, MD  Water For Irrigation, Sterile (FREE WATER) SOLN Place 250 mLs into feeding tube every 8 (eight) hours. 04/27/15  Yes Loletha Grayer, MD  zolpidem (AMBIEN) 5 MG tablet 5 mg by PEG Tube route at bedtime as needed for sleep.    Yes Historical Provider, MD  fluconazole (DIFLUCAN) 100 MG tablet Place 1 tablet (100 mg total) into feeding tube daily. Patient not taking: Reported on 05/05/2015 04/27/15   Loletha Grayer, MD  predniSONE (DELTASONE) 5 MG tablet 4 tabs peg day1; 3 tabs day2; 2 tabs day3; 1 tab day4,5 Patient not taking: Reported on 05/05/2015 04/27/15   Loletha Grayer, MD   Physical Exam: Filed Vitals:   05/05/15 2116 05/05/15 2157 05/05/15 2218 05/06/15 0359  BP: 131/56 100/70  138/48  Pulse: 84 88  94  Temp:  97.7 F (36.5 C)  98.1 F (36.7 C)  TempSrc:  Oral  Oral  Resp: 33 20  18  Height:  5\' 11"  (1.803 m)    Weight:  75.479 kg (166 lb 6.4 oz)    SpO2: 95% 96% 99% 96%    Wt Readings from Last 3 Encounters:  05/05/15 75.479 kg (166 lb 6.4 oz)  04/21/15 78.835 kg (173 lb 12.8 oz)  04/06/15 79.017 kg (174 lb 3.2 oz)    General:  Appears calm and comfortable Eyes: PERRL, normal lids, irises & conjunctiva ENT: grossly normal hearing,trach site clean Neck: no LAD, masses trach in place Cardiovascular: RRR, no m/r/g. No LE edema. Respiratory: Scattered rhonchi. Normal respiratory effort. Abdomen: soft, nontender Skin: no rash or  induration seen on limited exam Musculoskeletal: grossly normal tone BUE/BLE Psychiatric: grossly normal mood and affect Neurologic: grossly non-focal.          Labs on Admission:  Basic Metabolic Panel:  Recent Labs Lab 05/05/15 1542 05/06/15 0014  NA 142 143  K 4.1 3.8  CL 100* 101  CO2 39* 38*  GLUCOSE 221* 125*  BUN 30* 23*  CREATININE 0.64 0.56*  CALCIUM 8.4* 8.3*   Liver Function Tests:  Recent Labs Lab 05/05/15 1542  AST 19  ALT 22  ALKPHOS 151*  BILITOT 0.5  PROT 6.8  ALBUMIN 2.0*   No results for input(s): LIPASE, AMYLASE in the last 168 hours. No results for input(s): AMMONIA in the last 168 hours. CBC:  Recent Labs Lab 05/05/15 1542 05/06/15 0014  WBC 15.7* 14.6*  HGB 8.0* 7.6*  HCT 27.4* 25.7*  MCV 69.8* 70.5*  PLT 251 234   Cardiac Enzymes:  Recent Labs Lab 05/05/15 1542  TROPONINI 0.03    BNP (last 3 results)  Recent Labs  11/06/14 1231 04/06/15 1558  BNP 128.0* 286.0*    ProBNP (last 3 results) No results for input(s): PROBNP in the last 8760 hours.  CBG: No results for input(s): GLUCAP in the last 168 hours.  Radiological Exams on Admission: Dg Chest Portable 1 View  05/05/2015  CLINICAL DATA:  Dyspnea today. EXAM: PORTABLE CHEST 1 VIEW COMPARISON:  Single view of the chest 04/27/2015 and 04/24/2015. CT chest 04/21/2015. FINDINGS: Bilateral lower lobe masses are identified as on the prior exams. Small bilateral pleural effusions are seen. Airspace disease is seen in the left mid and lower lung zone and right lower lung zone. Heart size is upper normal. IMPRESSION: Airspace disease in the left mid and lower lung zones and right lower lung zone could be due to atelectasis, pneumonia or asymmetric edema. Small bilateral pleural effusions. Bilateral lower lobe masses as seen on comparison CT. Electronically Signed   By: Inge Rise M.D.   On: 05/05/2015 16:03    EKG: Independently reviewed.  Assessment/Plan Active  Problems:   Acute on chronic respiratory failure ([place])   1. Acute on chronic respiratory failure -continue with oxygen therapy as ordered -will continue with aggressive pulmonary toilet -patient is a DNR -I spoke to the wife and the patient at length and they do want Hospice consultation with Hospice at home -he is pretty clear about not wanting to go to a SNF  2. COPD -continue with present management -continue nebs  3. Chronic trach -currently stable   Code Status: DNR Limited Family Communication: wife at the bedside Disposition Plan: home with hospice  Time spent: 66min    I have personally obtained a history, examined the patient, evaluated laboratory and imaging results, formulated the assessment and plan and placed orders.  The Patient requires high complexity decision making for assessment and support.    Allyne Gee, MD [facility] Pulmonary Critical Care Medicine Sleep Medicine

## 2015-05-06 NOTE — Progress Notes (Signed)
Order for tube feeding jevity to run for overnight feedings from 1700 until 0900. Last night's feeding was started late into the night due to patient being admitted overnight. Consulted with dietician of when to stop current tube feeding so patient got enough nutrition. Dietician stated to go ahead and stop the tube feeding now so it would be stopped long enough to restart at 1700. Patient tolerated feeding well, residual only 10cc.

## 2015-05-06 NOTE — Progress Notes (Signed)
Initial Nutrition Assessment  DOCUMENTATION CODES:   Severe malnutrition in context of chronic illness  INTERVENTION:   EN: recommend continuing 16-hour night time feedings of Jevity 1.5 (TF to infuse from 17:00 to 9:00) as per order (provides 2280 kcals, 97 g of protein, 1155 mL of free water). Free water flushes of 250 mL q 8 hours at present provide additional 750 mL of free water daily. Continue to assess    NUTRITION DIAGNOSIS:   Malnutrition related to chronic illness as evidenced by % wt loss, severe muscle wasting  GOAL:   Patient will meet greater than or equal to 90% of their needs  MONITOR:    (Energy Intake, EN, Digestive System, Pulmonary, Electrolyte/Renal Profile, Anthropometrics)  REASON FOR ASSESSMENT:    (Tube Feeding)    ASSESSMENT:    Pt admitted with SOB with acute on chronic respiratory failure with pneumonia, on trach collar, noted palliative care consulted pending, pt with multiple recent admissions  Past Medical History  Diagnosis Date  . Cancer (HCC)     laryngeal cancer, prostrate cancer  . COPD (chronic obstructive pulmonary disease) ([place])   . Coronary artery disease   . Stroke [facility])     no residual weakness  . Depression   . Sleep apnea   . MRSA pneumonia ([place])   . Barrett's esophagus   . BPH (benign prostatic hyperplasia)   . Nephrolithiasis   . Osteoarthritis   . Anemia    Past Surgical History  Procedure Laterality Date  . Tracheostomy N/A   . Gastrostomy w/ feeding tube N/A   . Laminectomy    . Rotator cuff repair    . Lung removal, partial    . Esophageogastrectomy    . Peg tube placement    . Renal calculi    . Ureteral stent placement    . Flexible bronchoscopy N/A 12/24/2014    Procedure: FLEXIBLE BRONCHOSCOPY;  Surgeon: Allyne Gee, MD;  Location: [facility] ORS;  Service: Pulmonary;  Laterality: N/A;   Diet Order:  Diet NPO time specified Except for: Ice Chips   EN: order for Jevity 1.5 16 hour feedings placed by MD on  admission; pt tolerating feedings during the night, stopped around 10:00 this AM. TF to resume tonight at 17:00. Discussed with Lean RN  Food and Nutrition Related History: pt typically does night time feedings of Nutren 2.0 at home, recently has added additional can of Boost to this regimen. Typically while in hospital, pt receives Jevity 1.5 TF for 16 hours as Nutren 2.0 not on hospital formulary.   Digestive System: no signs of TF intolerance, minimal residuals  Nutrition Focused Physical Exam: Nutrition-Focused physical exam completed. Findings are moderate fat depletion, mild to severe muscle depletion, and  edema.    Height:   Ht Readings from Last 1 Encounters:  05/05/15 5\' 11"  (1.803 m)    Weight: wt continues to trend down; 4% wt loss in 2 weeks per wt encounters; 12.6% wt loss in 6 months  Wt Readings from Last 1 Encounters:  05/05/15 166 lb 6.4 oz (75.479 kg)    Filed Weights   05/05/15 2157  Weight: 166 lb 6.4 oz (75.479 kg)   Wt Readings from Last 10 Encounters:  05/05/15 166 lb 6.4 oz (75.479 kg)  04/21/15 173 lb 12.8 oz (78.835 kg)  04/06/15 174 lb 3.2 oz (79.017 kg)  12/24/14 180 lb (81.647 kg)  10/31/14 190 lb (86.183 kg)    BMI:  Body mass index is 23.[place]  kg/(m^2).  Estimated Nutritional Needs:   Kcal:  2177-2540 kcals (BEE 1512, 1.2 AF, 1.2-1.4 IF)   Protein:  87-103 g (1.1-1.3 g/kg)   Fluid:  1975-2370 mL (25-30 ml/kg)      HIGH Care Level  Kerman Passey MS, RD, LDN [phone] Pager

## 2015-05-06 NOTE — Progress Notes (Signed)
Alert and oriented. Patient has been suctioned 3 times this shift, no longer pink tinged. Clear/white thin secretions. Patient has sat in the chair a few times today and stands to pivot well with assistance. No complaints of pain. Vitals are stable. Tube feeding for overnight has been started. Patient had 10cc of residual after tube feeding was complete this AM and 0cc of residual this afternoon. Will continue to monitor.

## 2015-05-07 LAB — VANCOMYCIN, TROUGH: VANCOMYCIN TR: 16 ug/mL (ref 10–20)

## 2015-05-07 NOTE — Progress Notes (Signed)
New referral for [facility] services at home received from [facility]. Stephen Taylor is an 81 year old man admitted to [facility] on 11/8 for treatment of acute on chronic respiratory failure. This is his third admission in the last 6 months. Per chart note review patient has lost 12 lbs since 04/06/2015.  He is currently receiving IV antibiotics for suspected pneumonia. Patient has a trach and is receiving oxygen at 5 liters via trach collar. PMH includes: laryngeal cancer, prostate cancer, COPD, CVA, BPH, osteoarthritis, CAD, depression, sleep apnea, Barrett's esophagus, anemia and PEG Tube. Patient and his wife have decided to focus on his comfort at home with the support of hospice services.  Writer met in the room with Stephen Taylor and his wife Stephen Taylor. Education initiated regarding hospice services, philosophy and team approach to care with good understanding voiced. Patient is currently only eating ice by mouth, all other feeding is through his PEG tube. Writer did discuss the risk of aspiration with tube feedings. All equipment is currently in place in the home, hospital bed was declined at this time. Plan is for patient to discharge tomorrow via car. Stephen Taylor will bring portable tank to the hospital. DNR status confirmed and relayed to [place] would like to have a signed portable DNR to take home. All information faxed to referral intake. Thank you for the opportunity to be involved in the care of this patient . Flo Shanks RN, BSN, [place] [facility] [place], [facility] [phone]

## 2015-05-07 NOTE — Clinical Social Work Note (Signed)
Clinical Social Worker met with pt and wife to address discharge plan. Per wife, pt will discharge home with [facility]. Both wife and pt declined SNF. RNCM will follow for discharge planning needs. CSW provided supportive counseling. CSW will sign off as no further needs at this time. Please reconsult if a need arises prior to discharge.   Darden Dates, MSW,LCSW Clinical Social Worker  [phone]

## 2015-05-07 NOTE — Progress Notes (Signed)
Pt working to breath. Respirations are in 30's. Otherwise vitals are stable. Respiratory and nursing staff have suctioned pt several times.  Secretions at last suction are thicker/pink tinged.  ICU charge nurse called to give second opinion.  Will continue to monitor. Jessee Avers

## 2015-05-07 NOTE — Progress Notes (Signed)
ANTIBIOTIC CONSULT NOTE - INITIAL  Pharmacy Consult for Zosyn,Vancomycin Indication: pneumonia  Allergies  Allergen Reactions  . Morphine And Related Other (See Comments)    Reaction:  Altered mental status    Patient Measurements: Height: 5\' 11"  (180.3 cm) Weight: 166 lb 6.4 oz (75.479 kg) IBW/kg (Calculated) : 75.3 Adjusted Body Weight: 75.3 kg   Vital Signs: Temp: 97.4 F (36.3 C) (11/10 2030) Temp Source: Oral (11/10 2030) BP: 148/49 mmHg (11/10 2030) Pulse Rate: 91 (11/10 2030) Intake/Output from previous day: 11/09 0701 - 11/10 0700 In: 915 [NG/GT:580; IV Piggyback:250] Out: 580 [Urine:550; Drains:30] Intake/Output from this shift: Total I/O In: -  Out: 62 [Urine:50; Drains:12]  Labs:  Recent Labs  05/05/15 1542 05/06/15 0014  WBC 15.7* 14.6*  HGB 8.0* 7.6*  PLT 251 234  CREATININE 0.64 0.56*   Estimated Creatinine Clearance: 77.1 mL/min (by C-G formula based on Cr of 0.56).  Recent Labs  05/07/15 2118  [facility] 16     Microbiology: Recent Results (from the past 720 hour(s))  Culture, expectorated sputum-assessment     Status: None   Collection Time: 04/22/15 12:40 AM  Result Value Ref Range Status   Specimen Description SPUTUM  Final   Special Requests Normal  Final   Sputum evaluation THIS SPECIMEN IS ACCEPTABLE FOR SPUTUM CULTURE  Final   Report Status 04/23/2015 FINAL  Final  Culture, respiratory (NON-Expectorated)     Status: None   Collection Time: 04/22/15 12:40 AM  Result Value Ref Range Status   Specimen Description SPUTUM  Final   Special Requests Normal Reflexed from [id number]  Final   Gram Stain   Final    EXCELLENT SPECIMEN - 90-100% WBCS MANY WBC SEEN FEW GRAM POSITIVE COCCI IN CHAINS IN CLUSTERS    Culture   Final    HEAVY GROWTH PSEUDOMONAS AERUGINOSA HEAVY GROWTH KLEBSIELLA PNEUMONIAE HEAVY GROWTH CITROBACTER FREUNDII    Report Status 04/26/2015 FINAL  Final   Organism ID, Bacteria PSEUDOMONAS AERUGINOSA  Final   Organism ID, Bacteria KLEBSIELLA PNEUMONIAE  Final   Organism ID, Bacteria CITROBACTER FREUNDII  Final      Susceptibility   Citrobacter freundii - MIC*    CEFAZOLIN >=64 RESISTANT Resistant     CEFTRIAXONE <=1 SENSITIVE Sensitive     CIPROFLOXACIN 1 SENSITIVE Sensitive     GENTAMICIN <=1 SENSITIVE Sensitive     IMIPENEM <=0.25 SENSITIVE Sensitive     NITROFURANTOIN <=16 SENSITIVE Sensitive     TRIMETH/SULFA >=320 RESISTANT Resistant     PIP/TAZO Value in next row Sensitive      SENSITIVE<=4    LEVOFLOXACIN Value in next row Sensitive      SENSITIVE1    * HEAVY GROWTH CITROBACTER FREUNDII   Klebsiella pneumoniae - MIC*    AMPICILLIN Value in next row Resistant      SENSITIVE1    CEFAZOLIN Value in next row Sensitive      SENSITIVE1    CEFTRIAXONE Value in next row Sensitive      SENSITIVE1    CIPROFLOXACIN Value in next row Sensitive      SENSITIVE1    GENTAMICIN Value in next row Sensitive      SENSITIVE1    IMIPENEM Value in next row Sensitive      SENSITIVE1    NITROFURANTOIN Value in next row Intermediate      SENSITIVE1    TRIMETH/SULFA Value in next row Resistant      SENSITIVE1    PIP/TAZO Value in next row  Sensitive      SENSITIVE<=4    LEVOFLOXACIN Value in next row Sensitive      SENSITIVE<=0.12    AMPICILLIN/SULBACTAM Value in next row Intermediate      INTERMEDIATE16    CEFTAZIDIME Value in next row Sensitive      SENSITIVE<=4    * HEAVY GROWTH KLEBSIELLA PNEUMONIAE   Pseudomonas aeruginosa - MIC*    CEFTAZIDIME Value in next row Sensitive      SENSITIVE<=4    CIPROFLOXACIN Value in next row Sensitive      SENSITIVE<=4    GENTAMICIN Value in next row Sensitive      SENSITIVE<=4    IMIPENEM Value in next row Sensitive      SENSITIVE<=4    PIP/TAZO Value in next row Sensitive      SENSITIVE<=4    * HEAVY GROWTH PSEUDOMONAS AERUGINOSA  Fungus Culture with Smear     Status: None (Preliminary result)   Collection Time: 04/22/15  3:14 PM  Result Value Ref  Range Status   Specimen Description BRONCHIAL ALVEOLAR LAVAGE  Final   Special Requests Normal  Final   Culture NO FUNGUS ISOLATED AFTER 14 DAYS  Final   Report Status PENDING  Incomplete  Culture, bal-quantitative     Status: None   Collection Time: 04/22/15  3:14 PM  Result Value Ref Range Status   Specimen Description BRONCHIAL ALVEOLAR LAVAGE  Final   Special Requests Normal  Final   Gram Stain   Final    MANY WBC SEEN MANY GRAM NEGATIVE RODS RARE GRAM POSITIVE COCCI IN CLUSTERS EXCELLENT SPECIMEN - 90-100% WBCS    Culture   Final    HEAVY GROWTH PSEUDOMONAS AERUGINOSA HEAVY GROWTH ENTEROBACTER AEROGENES    Report Status 04/26/2015 FINAL  Final   Organism ID, Bacteria PSEUDOMONAS AERUGINOSA  Final   Organism ID, Bacteria ENTEROBACTER AEROGENES  Final      Susceptibility   Enterobacter aerogenes - MIC*    CEFTAZIDIME Value in next row Sensitive      SENSITIVE4    CEFAZOLIN Value in next row Resistant      RESISTANT>=64    CEFTRIAXONE Value in next row Sensitive      SENSITIVE4    CIPROFLOXACIN Value in next row Sensitive      SENSITIVE<=0.25    GENTAMICIN Value in next row Sensitive      SENSITIVE<=1    IMIPENEM Value in next row Sensitive      SENSITIVE<=1    PIP/TAZO Value in next row Intermediate      INTERMEDIATE32    LEVOFLOXACIN Value in next row Sensitive      SENSITIVE<=0.12    * HEAVY GROWTH ENTEROBACTER AEROGENES   Pseudomonas aeruginosa - MIC*    CEFTAZIDIME Value in next row Sensitive      SENSITIVE4    CIPROFLOXACIN Value in next row Sensitive      SENSITIVE<=0.25    GENTAMICIN Value in next row Sensitive      SENSITIVE<=1    IMIPENEM Value in next row Sensitive      SENSITIVE1    PIP/TAZO Value in next row Sensitive      SENSITIVE8    * HEAVY GROWTH PSEUDOMONAS AERUGINOSA  MRSA PCR Screening     Status: None   Collection Time: 04/22/15  5:23 PM  Result Value Ref Range Status   MRSA by PCR NEGATIVE NEGATIVE Final    Comment:        The  GeneXpert MRSA Assay (  FDA approved for NASAL specimens only), is one component of a comprehensive MRSA colonization surveillance program. It is not intended to diagnose MRSA infection nor to guide or monitor treatment for MRSA infections.     Medical History: Past Medical History  Diagnosis Date  . Cancer (HCC)     laryngeal cancer, prostrate cancer  . COPD (chronic obstructive pulmonary disease) ([place])   . Coronary artery disease   . Stroke [facility])     no residual weakness  . Depression   . Sleep apnea   . MRSA pneumonia ([place])   . Barrett's esophagus   . BPH (benign prostatic hyperplasia)   . Nephrolithiasis   . Osteoarthritis   . Anemia     Medications:  Prescriptions prior to admission  Medication Sig Dispense Refill Last Dose  . aspirin EC 81 MG tablet 81 mg by PEG Tube route daily.    05/05/2015 at 1200  . ciprofloxacin (CIPRO) 750 MG tablet Place 1 tablet (750 mg total) into feeding tube 2 (two) times daily. 32 tablet 0 05/05/2015 at Unknown time  . citalopram (CELEXA) 20 MG tablet 20 mg by PEG Tube route daily.    05/05/2015 at Unknown time  . EPINEPHrine (EPIPEN 2-PAK) 0.3 mg/0.3 mL IJ SOAJ injection Inject 0.3 mg into the muscle once as needed (for severe allergic reaction).   PRN at PRN  . ferrous sulfate 325 (65 FE) MG EC tablet 325 mg by PEG Tube route daily.    05/05/2015 at Unknown time  . folic acid (FOLVITE) 1 MG tablet 1 mg by PEG Tube route daily.    05/05/2015 at Unknown time  . guaiFENesin (MUCINEX) 600 MG 12 hr tablet 600 mg by PEG Tube route 2 (two) times daily.    05/05/2015 at 1200  . guaifenesin (ROBITUSSIN) 100 MG/5ML syrup Take 400 mg by mouth every 4 (four) hours as needed for cough.   05/04/2015 at 1300  . HYDROcodone-acetaminophen (NORCO/VICODIN) 5-325 MG per tablet 1 tablet by PEG Tube route 3 (three) times daily as needed for moderate pain.    05/05/2015 at 1200  . ipratropium-albuterol (DUONEB) 0.5-2.5 (3) MG/3ML SOLN Take 3 mLs by nebulization every 6  (six) hours as needed (for shortness of breath).   05/05/2015 at Unknown time  . loratadine (CLARITIN) 10 MG tablet 10 mg by PEG Tube route daily.    05/05/2015 at Unknown time  . LORazepam (ATIVAN) 0.5 MG tablet 0.5 mg by PEG Tube route 4 (four) times daily as needed for anxiety.    05/05/2015 at Unknown time  . montelukast (SINGULAIR) 10 MG tablet 10 mg by PEG Tube route daily.    05/05/2015 at Unknown time  . Multiple Vitamin (MULTIVITAMIN WITH MINERALS) TABS tablet 1 tablet by PEG Tube route daily.    05/05/2015 at Unknown time  . Nutritional Supplements (FEEDING SUPPLEMENT, JEVITY 1.5 CAL/FIBER,) LIQD Place 1,000 mLs into feeding tube continuous.   05/05/2015 at Unknown time  . pantoprazole (PROTONIX) 40 MG tablet 40 mg by PEG Tube route 2 (two) times daily.    05/05/2015 at Unknown time  . tamsulosin (FLOMAX) 0.4 MG CAPS capsule 0.4 mg by PEG Tube route daily.    05/04/2015 at Unknown time  . Water For Irrigation, Sterile (FREE WATER) SOLN Place 250 mLs into feeding tube every 8 (eight) hours.   05/05/2015 at Unknown time  . zolpidem (AMBIEN) 5 MG tablet 5 mg by PEG Tube route at bedtime as needed for sleep.    05/04/2015  at Unknown time  . fluconazole (DIFLUCAN) 100 MG tablet Place 1 tablet (100 mg total) into feeding tube daily. (Patient not taking: Reported on 05/05/2015) 5 tablet 0   . predniSONE (DELTASONE) 5 MG tablet 4 tabs peg day1; 3 tabs day2; 2 tabs day3; 1 tab day4,5 (Patient not taking: Reported on 05/05/2015) 11 tablet 0    Assessment: Pseudomonas risk factors:   Prior use of cipro, MD suspects nosocomial PNA.  CrCl = 77.1 ml/min Ke = 0.07 hr-1 T1/2 = 9.9 hrs Vd = 52.9 L   Goal of Therapy:  Vancomycin trough level 10-15 mcg/ml  Plan:  Expected duration 7 days with resolution of temperature and/or normalization of WBC   Will start Zosyn 4.5 gm IV Q8H EI .  Vancomycin 1 gm IV X 1 given on 11/08 @ 20:00. Vancomycin 1 gm IV Q12H ordered to start 11/09 @ 8:00. Stacked dosing not  appropriate for this pt. This pt will reach Css by 11/10 @ 20:00. Will draw 1st trough on 11/10 @ 19:30, which will be at Css.   1110 2118 vancomycin trough 16 mcg/mL, therapeutic, continue current dose. Will continue to follow and adjust as needed to maintain trough 15 to 20 mcg/mL.  Laural Benes, Pharm.D.  Clinical Pharmacist 05/07/2015,10:06 PM

## 2015-05-07 NOTE — Progress Notes (Addendum)
ICU charge nurse agreed that patient looks like he has an increase work of breathing, she did not see an immediate need for ICU transfer.  MD, Dr. Posey Pronto notified for patients work of breath.  MD asked to come assess patient/advise on next move.  MD suggested to try a breathing treatment and then call back if he is still having trouble. Will continue to monitor. Jessee Avers

## 2015-05-07 NOTE — Progress Notes (Addendum)
[place] [facility] [place] NAME: Stephen Taylor    MR#:  [id number]  DATE OF BIRTH:  October 18, 1931  SUBJECTIVE:  CHIEF COMPLAINT:   Chief Complaint  Patient presents with  . Shortness of Breath   Feels better, no complaint. REVIEW OF SYSTEMS:  CONSTITUTIONAL: No fever, has generalized weakness.  EYES: No blurred or double vision.  EARS, NOSE, AND THROAT: No tinnitus or ear pain.  RESPIRATORY: No cough, shortness of breath, wheezing or hemoptysis.  CARDIOVASCULAR: No chest pain, orthopnea, edema.  GASTROINTESTINAL: No nausea, vomiting, diarrhea or abdominal pain.  GENITOURINARY: No dysuria, hematuria.  ENDOCRINE: No polyuria, nocturia,  HEMATOLOGY: No anemia, easy bruising or bleeding SKIN: No rash or lesion. MUSCULOSKELETAL: No joint pain or arthritis.   NEUROLOGIC: No tingling, numbness, weakness.  PSYCHIATRY: No anxiety or depression.   DRUG ALLERGIES:   Allergies  Allergen Reactions  . Morphine And Related Other (See Comments)    Reaction:  Altered mental status    VITALS:  Blood pressure 126/41, pulse 90, temperature 98.3 F (36.8 C), temperature source Oral, resp. rate 16, height 5\' 11"  (1.803 m), weight 75.479 kg (166 lb 6.4 oz), SpO2 100 %.  PHYSICAL EXAMINATION:  GENERAL:  81 y.o.-year-old patient lying in the bed with no acute distress.  EYES: Pupils equal, round, reactive to light and accommodation. No scleral icterus. Extraocular muscles intact.  HEENT: Head atraumatic, normocephalic. Trach collar.  NECK:  Supple, no jugular venous distention. No thyroid enlargement, no tenderness.  LUNGS: Very diminished breath sounds bilaterally, no wheezing, rales,rhonchi or crepitation. No use of accessory muscles of respiration.  CARDIOVASCULAR: S1, S2 normal. No murmurs, rubs, or gallops.  ABDOMEN: Soft, nontender, nondistended. Bowel sounds present. No organomegaly or mass.  EXTREMITIES: No pedal edema, cyanosis, or clubbing.   NEUROLOGIC: Cranial nerves II through XII are intact. Muscle strength 3-4/5 in all extremities. Sensation intact. Gait not checked.  PSYCHIATRIC: The patient is alert and oriented x 3.  SKIN: No obvious rash, lesion, or ulcer.    LABORATORY PANEL:   CBC  Recent Labs Lab 05/06/15 0014  WBC 14.6*  HGB 7.6*  HCT 25.7*  PLT 234   ------------------------------------------------------------------------------------------------------------------  Chemistries   Recent Labs Lab 05/05/15 1542 05/06/15 0014  NA 142 143  K 4.1 3.8  CL 100* 101  CO2 39* 38*  GLUCOSE 221* 125*  BUN 30* 23*  CREATININE 0.64 0.56*  CALCIUM 8.4* 8.3*  AST 19  --   ALT 22  --   ALKPHOS 151*  --   BILITOT 0.5  --    ------------------------------------------------------------------------------------------------------------------  Cardiac Enzymes  Recent Labs Lab 05/05/15 1542  TROPONINI 0.03   ------------------------------------------------------------------------------------------------------------------  RADIOLOGY:  Dg Chest Portable 1 View  05/05/2015  CLINICAL DATA:  Dyspnea today. EXAM: PORTABLE CHEST 1 VIEW COMPARISON:  Single view of the chest 04/27/2015 and 04/24/2015. CT chest 04/21/2015. FINDINGS: Bilateral lower lobe masses are identified as on the prior exams. Small bilateral pleural effusions are seen. Airspace disease is seen in the left mid and lower lung zone and right lower lung zone. Heart size is upper normal. IMPRESSION: Airspace disease in the left mid and lower lung zones and right lower lung zone could be due to atelectasis, pneumonia or asymmetric edema. Small bilateral pleural effusions. Bilateral lower lobe masses as seen on comparison CT. Electronically Signed   By: Inge Rise M.D.   On: 05/05/2015 16:03    EKG:   Orders placed or performed during  the hospital encounter of 05/05/15  . ED EKG  . ED EKG  . ED EKG  . ED EKG    ASSESSMENT AND PLAN:   #1  acute on chronic respiratory failure with hypoxia-this is likely secondary to pneumonia. -Continue O2 support with trach collar. Continue aggressive pulmonary toileting. Continue vancomycin and Zosyn.  #2 pneumonia-likely nosocomial pneumonia. Patient recently had a pseudomonal pneumonia. Continue vancomycin, Zosyn. Follow blood, sputum cultures and CBC.  #3 chronic anemia-hemoglobin stable. Continue iron supplements.  #4 depression-continue Celexa.  #5 GERD-continue Protonix.  #6 BPH-continue Flomax.  #7 history of laryngeal cancer status post tracheostomy-continue aggressive pulmonary toileting, trach care per protocol. Continue tube feedings.  Anemia of chronic disease. Hemoglobin decreased from 8.0 to 7.6, possible due to fluid dilution. Follow-up CBC.  Dehydration. Improved.  Physical therapy evaluation suggests skilled nursing facility placement. But his wife wants patient to be discharged home with hospice care.  All the records are reviewed and case discussed with Care Management/Social Workerr. Management plans discussed with the patient, his wife and they are in agreement. Greater than 50% time was spent on coordination of care and face-to-face counseling.  CODE STATUS: Partial code.  TOTAL TIME TAKING CARE OF THIS PATIENT: 39 minutes.   POSSIBLE D/C IN 3 DAYS, DEPENDING ON CLINICAL CONDITION.   Demetrios Loll M.D on 05/07/2015 at 2:00 PM  Between 7am to 6pm - Pager - [phone]  After 6pm go to [email or web address] - password [email or web address]  Office  [phone]  CC: Primary care physician; Lavera Guise, MD

## 2015-05-08 LAB — CBC
HEMATOCRIT: 25.6 % — AB (ref 40.0–52.0)
Hemoglobin: 7.2 g/dL — ABNORMAL LOW (ref 13.0–18.0)
MCH: 20.1 pg — AB (ref 26.0–34.0)
MCHC: 28.3 g/dL — ABNORMAL LOW (ref 32.0–36.0)
MCV: 71 fL — AB (ref 80.0–100.0)
PLATELETS: 256 10*3/uL (ref 150–440)
RBC: 3.6 MIL/uL — AB (ref 4.40–5.90)
RDW: 19.4 % — ABNORMAL HIGH (ref 11.5–14.5)
WBC: 13.2 10*3/uL — AB (ref 3.8–10.6)

## 2015-05-08 MED ORDER — AMOXICILLIN-POT CLAVULANATE 875-125 MG PO TABS: 1.0000 | ORAL_TABLET | Freq: Two times a day (BID) | ORAL | Status: AC

## 2015-05-10 LAB — CULTURE, BLOOD (ROUTINE X 2)
CULTURE: NO GROWTH
Culture: NO GROWTH

## 2015-05-13 LAB — FUNGUS CULTURE W SMEAR: SPECIAL REQUESTS: NORMAL

## 2015-05-28 NOTE — Care Management (Signed)
Patient to discharge today to home with Hospice services through [facility] [place].  DNR signed by MD and placed on Chart.  Patient to travel by EMS.  RNCM signing off

## 2015-05-28 NOTE — Discharge Instructions (Signed)
Tube feeding Hospice care.  Care of a Feeding Tube Feeding tubes are often given to those who have trouble swallowing or cannot take food or medicine. A feeding tube can:   Go into the nose and down to the stomach.  Go through the skin in the belly (abdomen) and into the stomach or small bowel. SUPPLIES NEEDED TO CARE FOR THE TUBE SITE  Clean gloves.  Clean wash cloth, gauze pads, or soft paper towel.  Cotton swabs.  Skin barrier ointment or cream.  Soap and water.  Precut foam pads or gauze (that go around the tube).  Tube tape. TUBE SITE CARE 1. Have all supplies ready. 2. Wash hands well. 3.  Put on clean gloves. 4. Remove dirty foam pads or gauze near the tube site, if present. 5. Check the skin around the tube site for redness, rash, puffiness (swelling), leaking fluid, or extra tissue growth. Call your doctor if you see any of these. 6. Wet the gauze and cotton swabs with water and soap. 7. Wipe the area closest to the tube with cotton swabs. Wipe the surrounding skin with moistened gauze. Rinse with water. 8. Dry the skin and tube site with a dry gauze pad or soft paper towel. Do not use antibiotic ointments at the tube site. 9. If the skin is red, apply petroleum jelly in a circular motion, using a cotton swab. Your doctor may suggest a different cream or ointment. Use what the doctor suggests. 10. Apply a new pre-cut foam pad or gauze around the tube. Tape the edges down. Foam pads or gauze may be left off if there is no fluid at the tube site. 11. Use tape or a device that will attach your feeding tube to your skin or do as directed. Rotate where you tape the tube. 12. Sit the person up. 13. Throw away used supplies. 14. Remove gloves. 15. Wash hands. SUPPLIES NEEDED TO FLUSH A FEEDING TUBE  Clean gloves.  60 mL syringe (that connects to feeding tube).  Towel.  Water. FLUSHING A FEEDING TUBE   Have all supplies ready.  Wash hands well.  Put on clean  gloves.  Pull 30 mL of water into the syringe.  Bend (kink) the feeding tube while disconnecting it from the feeding-bag tubing or while removing the plug at the end of the tube.  Insert the tip of the syringe into the end of the feeding tube. Stop bending the tube. Slowly inject the water.  If you cannot inject the water, the person with the feeding tube should lay on their left side.  After injecting the water, remove the syringe.  Always flush the tube before giving the first medicine, between medicines, and after the final medicine before starting a feeding.  Throw away used supplies.  Remove gloves.  Wash hands.   This information is not intended to replace advice given to you by your health care provider. Make sure you discuss any questions you have with your health care provider.   Document Released: 03/07/2012 Document Reviewed: 03/07/2012 Elsevier Interactive Patient Education 2016 [employer]   PEG Tube Home Guide A PEG tube is used to put food and fluids into the stomach. Before you leave the hospital, make sure that you know:  How to care for your PEG tube.  How to care for the opening (stoma) in your belly.  How to give yourself feedings and medicines.  When to call your doctor for help. HOW DO I CARE FOR MY  PEG TUBE? Check your PEG tube every day. Make sure:  It is not too tight.  It is in the right place. There is a mark on the tube that shows when the tube is in the right place. Adjust the tube if you need to. HOW DO I CARE FOR MY STOMA? Clean your stoma every day. Follow these steps: 16. Wash your hands with soap and water. 46. Wash the stoma gently with warm, soapy water. 18. Rinse the stoma with warm water. 44. Pat the stoma area dry. Check your stoma for:  Redness.  Leaking.  Skin irritation. HOW DO I GIVE A FEEDING? Your doctor will tell you:  How much nutrition and fluid you will need for each feeding.  How often to have a  feeding.  Whether you should take medicine in the tube by itself or with a feeding. To give yourself a feeding, follow these steps. 1. Lay out all of the things that you will need. 2. Make sure that the nutritional formula is at room temperature. 3. Wash your hands with soap and water. 4. Sit up or stand up straight. You will need to stay straight while you give yourself a feeding. 5. Make sure the syringe plunger is pushed in. Place the tip of the syringe in clear water, and slowly pull the plunger to bring (draw up) the water into the syringe. 6. Remove the clamp and the cap from the PEG tube. 7. Push the water out of the syringe to clean (flush) the tube. 8. If the tube is clear, draw up the formula into the syringe. Make sure to use the right amount for each feeding. Add water if you need to. 9. Slowly push the formula from the syringe through the tube. 10. After the feeding, flush the tube with water. 11. Put the clamp and the cap on the tube. 12. Stay sitting up or standing up straight for at least 30 minutes. HOW DO I GIVE MEDICINE? To give yourself medicine, follow these steps: 1. Lay out all of the things that you will need. 2. If your medicine is in tablet form, crush it and dissolve it in water. 3. Wash your hands with soap and water. 4. Sit up or stand up straight. You will need to stay straight while you give yourself medicine. 5. Make sure the syringe plunger is pushed in. Place the tip of the syringe in clear water, and slowly pull the plunger to bring (draw up) the water into the syringe. 6. Remove the clamp and the cap from the PEG tube. 7. Push the water out of the syringe to clean (flush) the tube. 8. If the tube is clear, draw up the medicine into the syringe. 9. Slowly push the medicine from the syringe through the tube. 10. Flush the tube with water. 11. Put the clamp and the cap on the tube. 12. Stay sitting up or standing up straight for at least 30 minutes. You  should not take sustained release (SR) medicines through your tube. If you are not sure if your medicine is a SR medicine, ask your doctor or pharmacist. GET HELP IF:  The area around your stoma is sore, irritated, or red.  You have belly pain or bloating while you are feeding or afterward.  You have a feeling of being sick to your stomach (nausea) that does not go away.  You cannot poop (constipated) or you have watery poop (diarrhea) for a long time.  You have a fever.  You have problems with your PEG tube. GET HELP RIGHT AWAY IF:  Your tube is blocked.  Your tube falls out.  You have pain around your tube.  You are bleeding from your tube.  Your tube is leaking.  You choke or you have trouble breathing while you are feeding or afterward.   This information is not intended to replace advice given to you by your health care provider. Make sure you discuss any questions you have with your health care provider.   Document Released: 07/16/2010 Document Revised: 10/28/2014 Document Reviewed: 06/18/2014 Elsevier Interactive Patient Education [employer].

## 2015-05-28 NOTE — Progress Notes (Signed)
ANTIBIOTIC CONSULT NOTE - FOLLOW UP   Pharmacy Consult for Zosyn,Vancomycin Indication: pneumonia  Allergies  Allergen Reactions  . Morphine And Related Other (See Comments)    Reaction:  Altered mental status    Patient Measurements: Height: 5\' 11"  (180.3 cm) Weight: 166 lb 6.4 oz (75.479 kg) IBW/kg (Calculated) : 75.3 Adjusted Body Weight: 75.3 kg   Vital Signs: Temp: 98.1 F (36.7 C) (11/11 0406) Temp Source: Oral (11/11 0406) BP: 148/59 mmHg (11/11 0406) Pulse Rate: 100 (11/11 0406) Intake/Output from previous day: 11/10 0701 - 11/11 0700 In: 3681 [NG/GT:3301; IV Piggyback:300] Out: 287 [Urine:250; Drains:37] Intake/Output from this shift: Total I/O In: 362 [Other:154; NG/GT:208] Out: 170 [Urine:150; Drains:20]  Labs:  Recent Labs  05/05/15 1542 05/06/15 0014 2015-05-19 0839  WBC 15.7* 14.6* 13.2*  HGB 8.0* 7.6* 7.2*  PLT 251 234 256  CREATININE 0.64 0.56*  --    Estimated Creatinine Clearance: 77.1 mL/min (by C-G formula based on Cr of 0.56).  Recent Labs  05/07/15 2118  [facility]     Microbiology: Recent Results (from the past 720 hour(s))  Culture, expectorated sputum-assessment     Status: None   Collection Time: 04/22/15 12:40 AM  Result Value Ref Range Status   Specimen Description SPUTUM  Final   Special Requests Normal  Final   Sputum evaluation THIS SPECIMEN IS ACCEPTABLE FOR SPUTUM CULTURE  Final   Report Status 04/23/2015 FINAL  Final  Culture, respiratory (NON-Expectorated)     Status: None   Collection Time: 04/22/15 12:40 AM  Result Value Ref Range Status   Specimen Description SPUTUM  Final   Special Requests Normal Reflexed from [id number]  Final   Gram Stain   Final    EXCELLENT SPECIMEN - 90-100% WBCS MANY WBC SEEN FEW GRAM POSITIVE COCCI IN CHAINS IN CLUSTERS    Culture   Final    HEAVY GROWTH PSEUDOMONAS AERUGINOSA HEAVY GROWTH KLEBSIELLA PNEUMONIAE HEAVY GROWTH CITROBACTER FREUNDII    Report Status 04/26/2015 FINAL   Final   Organism ID, Bacteria PSEUDOMONAS AERUGINOSA  Final   Organism ID, Bacteria KLEBSIELLA PNEUMONIAE  Final   Organism ID, Bacteria CITROBACTER FREUNDII  Final      Susceptibility   Citrobacter freundii - MIC*    CEFAZOLIN >=64 RESISTANT Resistant     CEFTRIAXONE <=1 SENSITIVE Sensitive     CIPROFLOXACIN 1 SENSITIVE Sensitive     GENTAMICIN <=1 SENSITIVE Sensitive     IMIPENEM <=0.25 SENSITIVE Sensitive     NITROFURANTOIN <=16 SENSITIVE Sensitive     TRIMETH/SULFA >=320 RESISTANT Resistant     PIP/TAZO Value in next row Sensitive      SENSITIVE<=4    LEVOFLOXACIN Value in next row Sensitive      SENSITIVE1    * HEAVY GROWTH CITROBACTER FREUNDII   Klebsiella pneumoniae - MIC*    AMPICILLIN Value in next row Resistant      SENSITIVE1    CEFAZOLIN Value in next row Sensitive      SENSITIVE1    CEFTRIAXONE Value in next row Sensitive      SENSITIVE1    CIPROFLOXACIN Value in next row Sensitive      SENSITIVE1    GENTAMICIN Value in next row Sensitive      SENSITIVE1    IMIPENEM Value in next row Sensitive      SENSITIVE1    NITROFURANTOIN Value in next row Intermediate      SENSITIVE1    TRIMETH/SULFA Value in next row Resistant  SENSITIVE1    PIP/TAZO Value in next row Sensitive      SENSITIVE<=4    LEVOFLOXACIN Value in next row Sensitive      SENSITIVE<=0.12    AMPICILLIN/SULBACTAM Value in next row Intermediate      INTERMEDIATE16    CEFTAZIDIME Value in next row Sensitive      SENSITIVE<=4    * HEAVY GROWTH KLEBSIELLA PNEUMONIAE   Pseudomonas aeruginosa - MIC*    CEFTAZIDIME Value in next row Sensitive      SENSITIVE<=4    CIPROFLOXACIN Value in next row Sensitive      SENSITIVE<=4    GENTAMICIN Value in next row Sensitive      SENSITIVE<=4    IMIPENEM Value in next row Sensitive      SENSITIVE<=4    PIP/TAZO Value in next row Sensitive      SENSITIVE<=4    * HEAVY GROWTH PSEUDOMONAS AERUGINOSA  Fungus Culture with Smear     Status: None (Preliminary  result)   Collection Time: 04/22/15  3:14 PM  Result Value Ref Range Status   Specimen Description BRONCHIAL ALVEOLAR LAVAGE  Final   Special Requests Normal  Final   Culture NO FUNGUS ISOLATED AFTER 14 DAYS  Final   Report Status PENDING  Incomplete  Culture, bal-quantitative     Status: None   Collection Time: 04/22/15  3:14 PM  Result Value Ref Range Status   Specimen Description BRONCHIAL ALVEOLAR LAVAGE  Final   Special Requests Normal  Final   Gram Stain   Final    MANY WBC SEEN MANY GRAM NEGATIVE RODS RARE GRAM POSITIVE COCCI IN CLUSTERS EXCELLENT SPECIMEN - 90-100% WBCS    Culture   Final    HEAVY GROWTH PSEUDOMONAS AERUGINOSA HEAVY GROWTH ENTEROBACTER AEROGENES    Report Status 04/26/2015 FINAL  Final   Organism ID, Bacteria PSEUDOMONAS AERUGINOSA  Final   Organism ID, Bacteria ENTEROBACTER AEROGENES  Final      Susceptibility   Enterobacter aerogenes - MIC*    CEFTAZIDIME Value in next row Sensitive      SENSITIVE4    CEFAZOLIN Value in next row Resistant      RESISTANT>=64    CEFTRIAXONE Value in next row Sensitive      SENSITIVE4    CIPROFLOXACIN Value in next row Sensitive      SENSITIVE<=0.25    GENTAMICIN Value in next row Sensitive      SENSITIVE<=1    IMIPENEM Value in next row Sensitive      SENSITIVE<=1    PIP/TAZO Value in next row Intermediate      INTERMEDIATE32    LEVOFLOXACIN Value in next row Sensitive      SENSITIVE<=0.12    * HEAVY GROWTH ENTEROBACTER AEROGENES   Pseudomonas aeruginosa - MIC*    CEFTAZIDIME Value in next row Sensitive      SENSITIVE4    CIPROFLOXACIN Value in next row Sensitive      SENSITIVE<=0.25    GENTAMICIN Value in next row Sensitive      SENSITIVE<=1    IMIPENEM Value in next row Sensitive      SENSITIVE1    PIP/TAZO Value in next row Sensitive      SENSITIVE8    * HEAVY GROWTH PSEUDOMONAS AERUGINOSA  MRSA PCR Screening     Status: None   Collection Time: 04/22/15  5:23 PM  Result Value Ref Range Status    MRSA by PCR NEGATIVE NEGATIVE Final    Comment:  The GeneXpert MRSA Assay (FDA approved for NASAL specimens only), is one component of a comprehensive MRSA colonization surveillance program. It is not intended to diagnose MRSA infection nor to guide or monitor treatment for MRSA infections.   Blood culture (routine x 2)     Status: None (Preliminary result)   Collection Time: 05/05/15  6:30 PM  Result Value Ref Range Status   Specimen Description BLOOD LEFT ASSIST CONTROL  Final   Special Requests BOTTLES DRAWN AEROBIC AND ANAEROBIC [place]  Final   Culture NO GROWTH 3 DAYS  Final   Report Status PENDING  Incomplete  Blood culture (routine x 2)     Status: None (Preliminary result)   Collection Time: 05/05/15  6:35 PM  Result Value Ref Range Status   Specimen Description BLOOD RIGHT ASSIST CONTROL  Final   Special Requests   Final    BOTTLES DRAWN AEROBIC AND ANAEROBIC 5CC AERO, 4CC ANA   Culture NO GROWTH 3 DAYS  Final   Report Status PENDING  Incomplete    Medical History: Past Medical History  Diagnosis Date  . Cancer (HCC)     laryngeal cancer, prostrate cancer  . COPD (chronic obstructive pulmonary disease) ([place])   . Coronary artery disease   . Stroke [facility])     no residual weakness  . Depression   . Sleep apnea   . MRSA pneumonia ([place])   . Barrett's esophagus   . BPH (benign prostatic hyperplasia)   . Nephrolithiasis   . Osteoarthritis   . Anemia     Medications:  Prescriptions prior to admission  Medication Sig Dispense Refill Last Dose  . aspirin EC 81 MG tablet 81 mg by PEG Tube route daily.    05/05/2015 at 1200  . ciprofloxacin (CIPRO) 750 MG tablet Place 1 tablet (750 mg total) into feeding tube 2 (two) times daily. 32 tablet 0 05/05/2015 at Unknown time  . citalopram (CELEXA) 20 MG tablet 20 mg by PEG Tube route daily.    05/05/2015 at Unknown time  . EPINEPHrine (EPIPEN 2-PAK) 0.3 mg/0.3 mL IJ SOAJ injection Inject 0.3 mg into the muscle once as  needed (for severe allergic reaction).   PRN at PRN  . ferrous sulfate 325 (65 FE) MG EC tablet 325 mg by PEG Tube route daily.    05/05/2015 at Unknown time  . folic acid (FOLVITE) 1 MG tablet 1 mg by PEG Tube route daily.    05/05/2015 at Unknown time  . guaiFENesin (MUCINEX) 600 MG 12 hr tablet 600 mg by PEG Tube route 2 (two) times daily.    05/05/2015 at 1200  . guaifenesin (ROBITUSSIN) 100 MG/5ML syrup Take 400 mg by mouth every 4 (four) hours as needed for cough.   05/04/2015 at 1300  . HYDROcodone-acetaminophen (NORCO/VICODIN) 5-325 MG per tablet 1 tablet by PEG Tube route 3 (three) times daily as needed for moderate pain.    05/05/2015 at 1200  . ipratropium-albuterol (DUONEB) 0.5-2.5 (3) MG/3ML SOLN Take 3 mLs by nebulization every 6 (six) hours as needed (for shortness of breath).   05/05/2015 at Unknown time  . loratadine (CLARITIN) 10 MG tablet 10 mg by PEG Tube route daily.    05/05/2015 at Unknown time  . LORazepam (ATIVAN) 0.5 MG tablet 0.5 mg by PEG Tube route 4 (four) times daily as needed for anxiety.    05/05/2015 at Unknown time  . montelukast (SINGULAIR) 10 MG tablet 10 mg by PEG Tube route daily.    05/05/2015  at Unknown time  . Multiple Vitamin (MULTIVITAMIN WITH MINERALS) TABS tablet 1 tablet by PEG Tube route daily.    05/05/2015 at Unknown time  . Nutritional Supplements (FEEDING SUPPLEMENT, JEVITY 1.5 CAL/FIBER,) LIQD Place 1,000 mLs into feeding tube continuous.   05/05/2015 at Unknown time  . pantoprazole (PROTONIX) 40 MG tablet 40 mg by PEG Tube route 2 (two) times daily.    05/05/2015 at Unknown time  . tamsulosin (FLOMAX) 0.4 MG CAPS capsule 0.4 mg by PEG Tube route daily.    05/04/2015 at Unknown time  . Water For Irrigation, Sterile (FREE WATER) SOLN Place 250 mLs into feeding tube every 8 (eight) hours.   05/05/2015 at Unknown time  . zolpidem (AMBIEN) 5 MG tablet 5 mg by PEG Tube route at bedtime as needed for sleep.    05/04/2015 at Unknown time  . fluconazole (DIFLUCAN) 100 MG  tablet Place 1 tablet (100 mg total) into feeding tube daily. (Patient not taking: Reported on 05/05/2015) 5 tablet 0   . predniSONE (DELTASONE) 5 MG tablet 4 tabs peg day1; 3 tabs day2; 2 tabs day3; 1 tab day4,5 (Patient not taking: Reported on 05/05/2015) 11 tablet 0    Assessment: Pseudomonas risk factors:   Prior use of cipro, MD suspects nosocomial PNA.  CrCl = 77.1 ml/min Ke = 0.07 hr-1 T1/2 = 9.9 hrs Vd = 52.9 L   Goal of Therapy:  Vancomycin trough level 10-15 mcg/ml  Plan:  Expected duration 7 days with resolution of temperature and/or normalization of WBC   Will start Zosyn 4.5 gm IV Q8H EI .   1110 2118 vancomycin trough 16 mcg/mL, therapeutic, continue current dose. Will continue to follow and adjust as needed to maintain trough 15 to 20 mcg/mL.  Will continue Vancomycin 1 g IV q12 hours. Will recommend to provide about de-escalation of therapy due to Blood cultures still NGTD.   Kaylianna Detert D, Pharm.D.  Clinical Pharmacist 2015-05-11,12:20 PM

## 2015-05-28 NOTE — Discharge Summary (Signed)
[place] [facility] [place] NAME: Stephen Taylor    MR#:  [id number]  DATE OF BIRTH:  Dec 22, 1931  DATE OF ADMISSION:  05/05/2015 ADMITTING PHYSICIAN: Henreitta Leber, MD  DATE OF DISCHARGE: 05-22-2015 12:34 PM  PRIMARY CARE PHYSICIAN: Lavera Guise, MD    ADMISSION DIAGNOSIS:  HCAP (healthcare-associated pneumonia) [J18.9]   DISCHARGE DIAGNOSIS:  acute on chronic respiratory failure with hypoxia-this is likely secondary to pneumonia.  SECONDARY DIAGNOSIS:   Past Medical History  Diagnosis Date  . Cancer (HCC)     laryngeal cancer, prostrate cancer  . COPD (chronic obstructive pulmonary disease) ([place])   . Coronary artery disease   . Stroke [facility])     no residual weakness  . Depression   . Sleep apnea   . MRSA pneumonia ([place])   . Barrett's esophagus   . BPH (benign prostatic hyperplasia)   . Nephrolithiasis   . Osteoarthritis   . Anemia     HOSPITAL COURSE:   #1 acute on chronic respiratory failure with hypoxia-this is likely secondary to pneumonia. -Continue O2 support with trach collar. Continue aggressive pulmonary toileting. The patient was treated with vancomycin and Zosyn. Vancomycin was discontinued. We will change to Augmentin by mouth twice a day for 10 days.  #2 pneumonia-likely nosocomial pneumonia. Patient recently had a pseudomonal pneumonia. As mentioned above. Leukocytosis is improving.  #3 chronic anemia-hemoglobin stable. Continue iron supplements.  #4 depression-continue Celexa.  #5 GERD-continue Protonix.  #6 BPH-continue Flomax.  #7 history of laryngeal cancer status post tracheostomy-continue aggressive pulmonary toileting, trach care per protocol. Continue tube feedings.  Dehydration. Improved.  Physical therapy evaluation suggested skilled nursing facility placement. But his wife wants patient to be discharged home with hospice care.  DISCHARGE CONDITIONS:   Guarded, discharged to home with  hospice care.  CONSULTS OBTAINED:     DRUG ALLERGIES:   Allergies  Allergen Reactions  . Morphine And Related Other (See Comments)    Reaction:  Altered mental status    DISCHARGE MEDICATIONS:   Discharge Medication List as of May 22, 2015 11:41 AM    START taking these medications   Details  amoxicillin-clavulanate (AUGMENTIN) 875-125 MG tablet Take 1 tablet by mouth 2 (two) times daily., Starting 05/22/2015, Until Discontinued, Print      CONTINUE these medications which have NOT CHANGED   Details  aspirin EC 81 MG tablet 81 mg by PEG Tube route daily. , Until Discontinued, Historical Med    citalopram (CELEXA) 20 MG tablet 20 mg by PEG Tube route daily. , Until Discontinued, Historical Med    EPINEPHrine (EPIPEN 2-PAK) 0.3 mg/0.3 mL IJ SOAJ injection Inject 0.3 mg into the muscle once as needed (for severe allergic reaction)., Until Discontinued, Historical Med    ferrous sulfate 325 (65 FE) MG EC tablet 325 mg by PEG Tube route daily. , Until Discontinued, Historical Med    folic acid (FOLVITE) 1 MG tablet 1 mg by PEG Tube route daily. , Until Discontinued, Historical Med    guaiFENesin (MUCINEX) 600 MG 12 hr tablet 600 mg by PEG Tube route 2 (two) times daily. , Until Discontinued, Historical Med    guaifenesin (ROBITUSSIN) 100 MG/5ML syrup Take 400 mg by mouth every 4 (four) hours as needed for cough., Until Discontinued, Historical Med    HYDROcodone-acetaminophen (NORCO/VICODIN) 5-325 MG per tablet 1 tablet by PEG Tube route 3 (three) times daily as needed for moderate pain. , Until Discontinued, Historical Med  ipratropium-albuterol (DUONEB) 0.5-2.5 (3) MG/3ML SOLN Take 3 mLs by nebulization every 6 (six) hours as needed (for shortness of breath)., Until Discontinued, Historical Med    loratadine (CLARITIN) 10 MG tablet 10 mg by PEG Tube route daily. , Until Discontinued, Historical Med    LORazepam (ATIVAN) 0.5 MG tablet 0.5 mg by PEG Tube route 4 (four) times  daily as needed for anxiety. , Until Discontinued, Historical Med    montelukast (SINGULAIR) 10 MG tablet 10 mg by PEG Tube route daily. , Until Discontinued, Historical Med    Nutritional Supplements (FEEDING SUPPLEMENT, JEVITY 1.5 CAL/FIBER,) LIQD Place 1,000 mLs into feeding tube continuous., Starting 04/27/2015, Until Discontinued, No Print    pantoprazole (PROTONIX) 40 MG tablet 40 mg by PEG Tube route 2 (two) times daily. , Until Discontinued, Historical Med    tamsulosin (FLOMAX) 0.4 MG CAPS capsule 0.4 mg by PEG Tube route daily. , Until Discontinued, Historical Med    Water For Irrigation, Sterile (FREE WATER) SOLN Place 250 mLs into feeding tube every 8 (eight) hours., Starting 04/27/2015, Until Discontinued, No Print    zolpidem (AMBIEN) 5 MG tablet 5 mg by PEG Tube route at bedtime as needed for sleep. , Until Discontinued, Historical Med    fluconazole (DIFLUCAN) 100 MG tablet Place 1 tablet (100 mg total) into feeding tube daily., Starting 04/27/2015, Until Discontinued, Print      STOP taking these medications     ciprofloxacin (CIPRO) 750 MG tablet      Multiple Vitamin (MULTIVITAMIN WITH MINERALS) TABS tablet      predniSONE (DELTASONE) 5 MG tablet          DISCHARGE INSTRUCTIONS:    If you experience worsening of your admission symptoms, develop shortness of breath, life threatening emergency, suicidal or homicidal thoughts you must seek medical attention immediately by calling 911 or calling your MD immediately  if symptoms less severe.  You Must read complete instructions/literature along with all the possible adverse reactions/side effects for all the Medicines you take and that have been prescribed to you. Take any new Medicines after you have completely understood and accept all the possible adverse reactions/side effects.   Please note  You were cared for by a hospitalist during your hospital stay. If you have any questions about your discharge  medications or the care you received while you were in the hospital after you are discharged, you can call the unit and asked to speak with the hospitalist on call if the hospitalist that took care of you is not available. Once you are discharged, your primary care physician will handle any further medical issues. Please note that NO REFILLS for any discharge medications will be authorized once you are discharged, as it is imperative that you return to your primary care physician (or establish a relationship with a primary care physician if you do not have one) for your aftercare needs so that they can reassess your need for medications and monitor your lab values.    Today   SUBJECTIVE   No complaint. On trach collar 5 L oxygen.   VITAL SIGNS:  Blood pressure 148/59, pulse 100, temperature 98.1 F (36.7 C), temperature source Oral, resp. rate 21, height 5\' 11"  (1.803 m), weight 75.479 kg (166 lb 6.4 oz), SpO2 91 %.  I/O:   Intake/Output Summary (Last 24 hours) at May 22, 2015 1625 Last data filed at May 22, 2015 1100  Gross per 24 hour  Intake   2108 ml  Output    352  ml  Net   1756 ml    PHYSICAL EXAMINATION:  GENERAL:  81 y.o.-year-old patient lying in the bed with no acute distress.  EYES: Pupils equal, round, reactive to light and accommodation. No scleral icterus. Extraocular muscles intact.  HEENT: Head atraumatic, normocephalic. Trach collar.  NECK:  Supple, no jugular venous distention. No thyroid enlargement, no tenderness.  LUNGS: Normal breath sounds bilaterally, no wheezing, rales,rhonchi or crepitation. No use of accessory muscles of respiration.  CARDIOVASCULAR: S1, S2 normal. No murmurs, rubs, or gallops.  ABDOMEN: Soft, non-tender, non-distended. Bowel sounds present. No organomegaly or mass.  EXTREMITIES: No pedal edema, cyanosis, or clubbing.  NEUROLOGIC: Cranial nerves II through XII are intact. Muscle strength 3-4/5 in all extremities. Sensation intact. Gait not  checked.   PSYCHIATRIC: The patient is alert and oriented x 3.  SKIN: No obvious rash, lesion, or ulcer.   DATA REVIEW:   CBC  Recent Labs Lab 05-31-2015 0839  WBC 13.2*  HGB 7.2*  HCT 25.6*  PLT 256    Chemistries   Recent Labs Lab 05/05/15 1542 05/06/15 0014  NA 142 143  K 4.1 3.8  CL 100* 101  CO2 39* 38*  GLUCOSE 221* 125*  BUN 30* 23*  CREATININE 0.64 0.56*  CALCIUM 8.4* 8.3*  AST 19  --   ALT 22  --   ALKPHOS 151*  --   BILITOT 0.5  --     Cardiac Enzymes  Recent Labs Lab 05/05/15 1542  TROPONINI 0.03    Microbiology Results  Results for orders placed or performed during the hospital encounter of 05/05/15  Blood culture (routine x 2)     Status: None (Preliminary result)   Collection Time: 05/05/15  6:30 PM  Result Value Ref Range Status   Specimen Description BLOOD LEFT ASSIST CONTROL  Final   Special Requests BOTTLES DRAWN AEROBIC AND ANAEROBIC [place]  Final   Culture NO GROWTH 3 DAYS  Final   Report Status PENDING  Incomplete  Blood culture (routine x 2)     Status: None (Preliminary result)   Collection Time: 05/05/15  6:35 PM  Result Value Ref Range Status   Specimen Description BLOOD RIGHT ASSIST CONTROL  Final   Special Requests   Final    BOTTLES DRAWN AEROBIC AND ANAEROBIC 5CC AERO, 4CC ANA   Culture NO GROWTH 3 DAYS  Final   Report Status PENDING  Incomplete    RADIOLOGY:  No results found.      Management plans discussed with the patient, his wife and they are in agreement.  CODE STATUS:  Advance Directive Documentation        Most Recent Value   Type of Advance Directive  Living will   Pre-existing out of facility DNR order (yellow form or pink MOST form)     "MOST" Form in Place?        TOTAL TIME TAKING CARE OF THIS PATIENT: 37 minutes.    Demetrios Loll M.D on 31-May-2015 at 4:25 PM  Between 7am to 6pm - Pager - [phone]  After 6pm go to [email or web address] - password [email or web address] [facility]  Office   [phone]  CC: Primary care physician; Lavera Guise, MD

## 2015-05-28 NOTE — Progress Notes (Signed)
Pt. Discharged to home with hospice via EMS. Discharge instructions and medication regimen reviewed at bedside with patients spouse. She verbalizes understanding of instructions and medication regimen. Prescriptions included with discharge papers. Per pt, dr. Bridgett Larsson told her to mainly give medications for pain, anxiety and sleep and baby aspirin. Patient assessment unchanged from this morning. TELE and IV discontinued per policy.

## 2015-05-28 NOTE — Progress Notes (Signed)
Follow up visit made to new referral for hospice services at home after discharge. Patient seen lying in bed, appears to have made changes in his condition overnight, color poor, less alert.He remains on 5 liters of oxygen via trach collar. Patient's wife Diane present and reports he was restless this morning and also stated that "he looks different'. Writer discussed changes noted with Diane, she stated "I am ready for him to be at peace". [facility]  booklet given and symptoms discussed. Patient did not appear dyspneic or in pain. Plan remains for him to return home as soon as possible today. He will now be transported via EMS. Hospice referral alerted for need of same day admission. Signed portable DNR in place on patient's chart. Writer spoke with staff RN Maddie, CSW Baxter Flattery and [place] all are aware of discharge plan. Staff RN Maddie to call EMS for transport. Thank you for the opportunity to serve this Mr. Beni and his wife. Flo Shanks RN, BSN, [place] [facility] [place], [facility] [phone] c

## 2015-05-28 NOTE — Care Management Important Message (Signed)
Important Message  Patient Details  Name: Stephen Taylor MRN: [id number] Date of Birth: Mar 03, 1932   Medicare Important Message Given:  Yes    Beverly Sessions, RN 05-24-2015, 9:04 AM

## 2015-05-28 DEATH — deceased

## 2015-06-09 LAB — ACID FAST SMEAR+CULTURE W/RFLX (ARMC ONLY)
ACID FAST CULTURE: NEGATIVE
ACID FAST SMEAR: NEGATIVE

## 2016-03-14 IMAGING — CR DG CHEST 1V PORT
1 series · 1 of 1 positions shown · non-contrast
Comparison: Single view of the chest 04/27/2015 and 04/24/2015. CT
chest 04/21/2015.

CLINICAL DATA: Dyspnea today.

EXAM:
PORTABLE CHEST 1 VIEW

[ap]
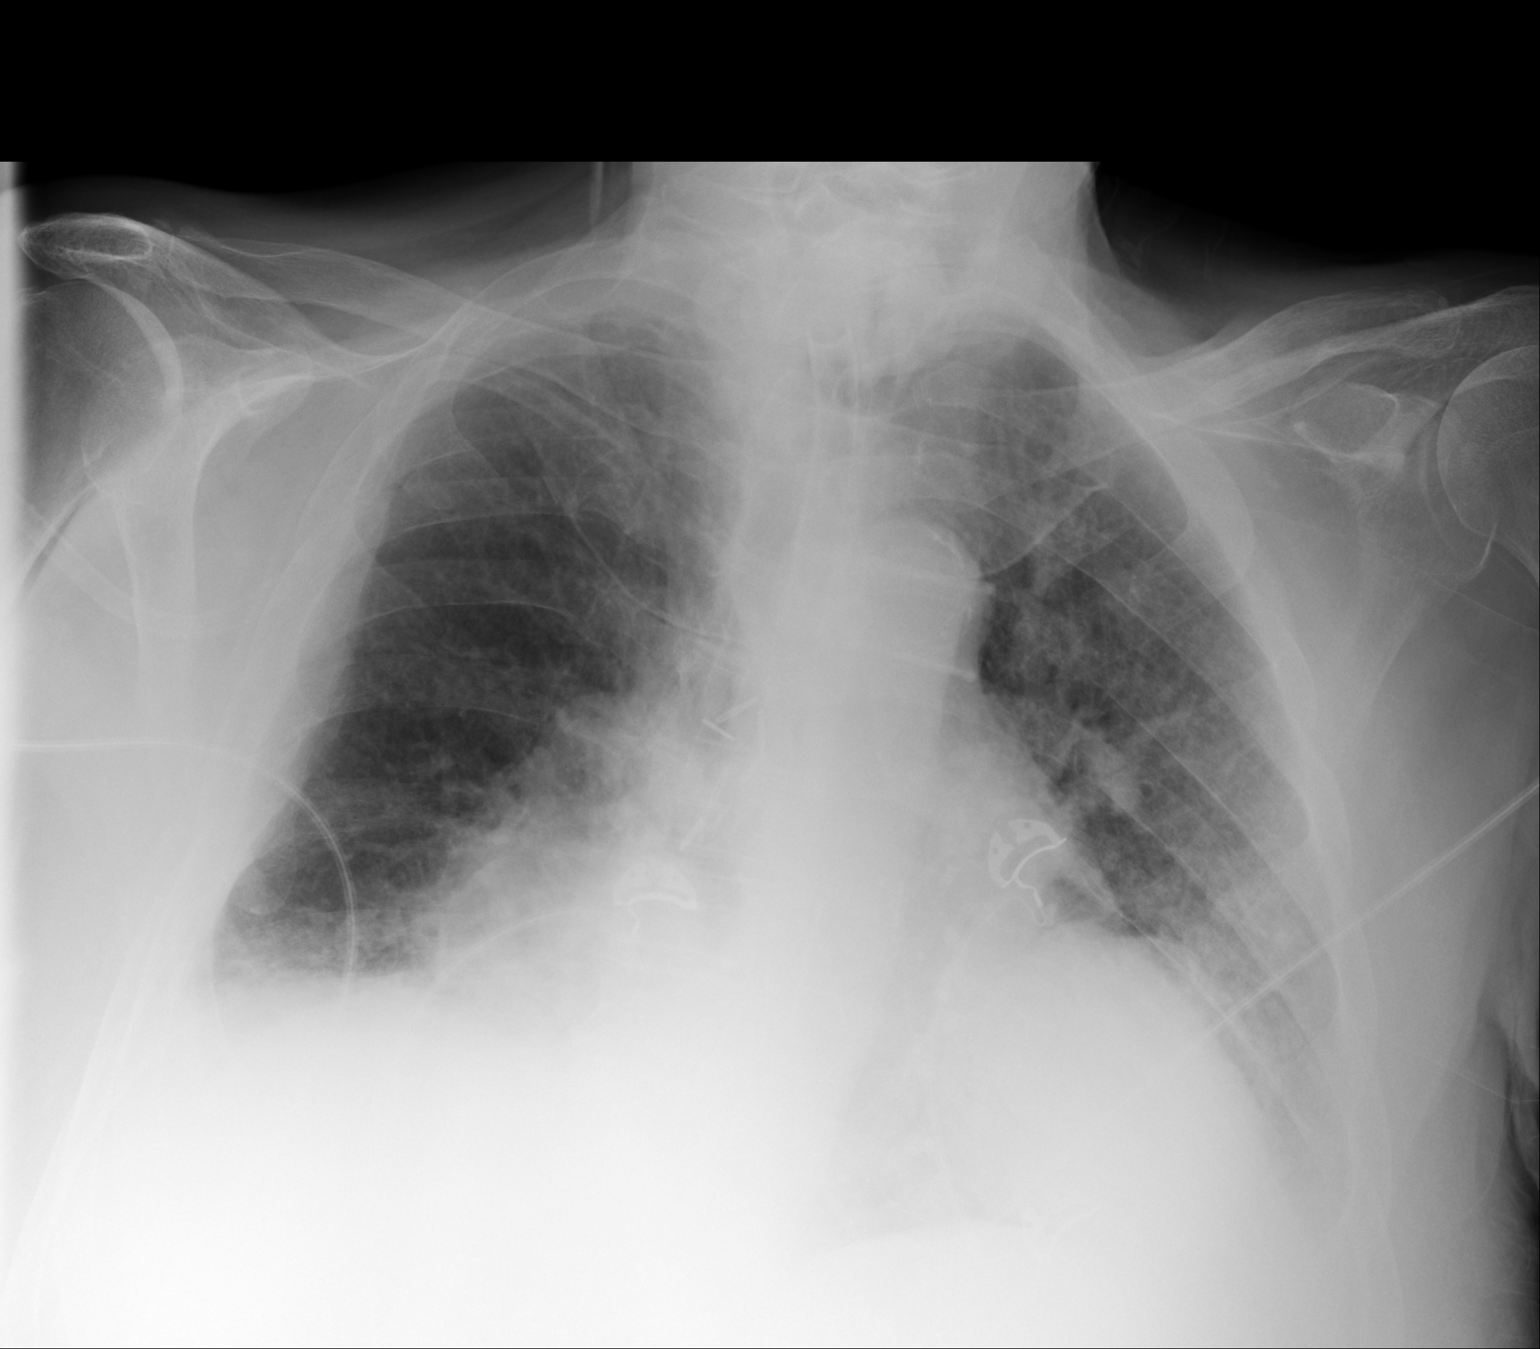

[1 of 1 positions shown; findings below may reference images not displayed]

FINDINGS: Bilateral lower lobe masses are identified as on the prior exams.
Small bilateral pleural effusions are seen. Airspace disease is seen
in the left mid and lower lung zone and right lower lung zone. Heart
size is upper normal.
IMPRESSION: Airspace disease in the left mid and lower lung zones and right
lower lung zone could be due to atelectasis, pneumonia or asymmetric
edema.

Small bilateral pleural effusions.

Bilateral lower lobe masses as seen on comparison CT.
